# Patient Record
Sex: Female | Born: 1990
Health system: Southern US, Community
[De-identification: ages and names within clinical notes are randomized; demographics above are authoritative.]

## PROBLEM LIST (undated history)

## (undated) DIAGNOSIS — L509 Urticaria, unspecified: Secondary | ICD-10-CM

## (undated) DIAGNOSIS — E669 Obesity, unspecified: Secondary | ICD-10-CM

## (undated) DIAGNOSIS — F419 Anxiety disorder, unspecified: Secondary | ICD-10-CM

## (undated) DIAGNOSIS — T783XXA Angioneurotic edema, initial encounter: Secondary | ICD-10-CM

## (undated) DIAGNOSIS — Z9109 Other allergy status, other than to drugs and biological substances: Secondary | ICD-10-CM

## (undated) HISTORY — PX: WISDOM TOOTH EXTRACTION: SHX21

## (undated) HISTORY — DX: Urticaria, unspecified: L50.9

## (undated) HISTORY — DX: Anxiety disorder, unspecified: F41.9

## (undated) HISTORY — PX: TONSILLECTOMY: SUR1361

## (undated) HISTORY — DX: Angioneurotic edema, initial encounter: T78.3XXA

---
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
## 2008-05-09 ENCOUNTER — Emergency Department (HOSPITAL_COMMUNITY): Admission: EM | Admit: 2008-05-09 | Discharge: 2008-05-09 | Payer: Self-pay | Admitting: Family Medicine

## 2009-04-18 ENCOUNTER — Emergency Department (HOSPITAL_COMMUNITY): Admission: EM | Admit: 2009-04-18 | Discharge: 2009-04-18 | Payer: Self-pay | Admitting: Family Medicine

## 2010-02-19 ENCOUNTER — Emergency Department (HOSPITAL_COMMUNITY): Admission: EM | Admit: 2010-02-19 | Discharge: 2010-02-19 | Payer: Self-pay | Admitting: Family Medicine

## 2010-03-11 ENCOUNTER — Emergency Department (HOSPITAL_COMMUNITY)
Admission: EM | Admit: 2010-03-11 | Discharge: 2010-03-11 | Payer: Self-pay | Source: Home / Self Care | Admitting: Family Medicine

## 2010-08-11 NOTE — Op Note (Signed)
[place]  Patient:    Felicia Salinas, Felicia Salinas                      MRN: [id number] Proc. Date: 05/10/00 Adm. Date:  [id number] Disc. Date: [id number] Attending:  Lucky Cowboy CC:         Velvet Bathe, M.D.                           Operative Report  PREOPERATIVE DIAGNOSIS:  Obstructive sleep apnea.  POSTOPERATIVE DIAGNOSIS:  Obstructive sleep apnea.  PROCEDURE:  Adenotonsillectomy.  ANESTHESIA:  General endotracheal anesthesia.  ESTIMATED BLOOD LOSS:  30 cc.  SPECIMENS:  Tonsils and adenoids.  COMPLICATIONS:  None.  INDICATIONS:  This patient is an 20 year old female who has had very poor energy during the day.  There is mouth-breathing and apnea at night.  She was found to have 3+ to kissing bilateral palatine tonsils.  For this reason, adenotonsillectomy is performed.  FINDINGS:  The patient was noted to have obstruction of the nasopharynx from adenoid hypertrophy.  Both tonsils were somewhat cryptic and at least 3+ bilaterally.  DESCRIPTION OF PROCEDURE:  The patient was taken to the operating room and placed on the table in supine position.  She was then placed under general endotracheal anesthesia, the table rotated counterclockwise 90 degrees.  The neck was then gently extended using a shoulder roll.  The eyes were taped shut.  The head and body were draped.  The Crowe-Davis mouth gag with the #3 tongue blade was then placed intraorally, opened, and suspended on the Mayo stand.  Palpation of the soft palate was without evidence of a submucosal cleft.  A red rubber catheter was placed down the right nostril, brought out through the oral cavity, and secured in place with a hemostat.  Inspection of the nasopharynx was performed using a mirror.  Under indirect visualization, a medium-sized adenoid curette was placed against the vomer and directed inferiorly.  Subsequent passes were made.  This removed the majority of the adenoid pad.  The  remainder was removed using tonsil and [place] forceps. Three sterile gauze Afrin-soaked packs were placed in the nasopharynx and time allowed for hemostasis.  The palate was relaxed.  The tonsils were then removed.  The right palatine tonsil was grasped with Allis camps, directed inferomedially.  Harmonic scalpel was used to excise the tonsil in the peritonsillar space, staying adjacent to the left tonsillar capsule.  The left palatine tonsil was removed in identical fashion.  The palate was then re-elevated and all packs removed from the nasopharynx.  Point hemostasis was achieved using suction cautery.  The nasopharynx was copiously irrigated using normal saline transnasally, which was suctioned out through the oral cavity. An NG tube was placed down the esophagus for suctioning of the gastric contents.  The mouth gag was removed, noting no damage to the teeth or soft tissues.  The table was rotated clockwise 90 degrees to its original position. The patient was then awakened from anesthesia and extubated in the operating room.  She was taken to the postanesthesia care unit in stable condition. There were no complications.  The patient will be discharged in the recovery care center and will either be discharged later tonight or in the morning depending on progress.  DISCHARGE MEDICATIONS:  Lortab elixir 10-20 cc p.o. q.4-6h. p.r.n. pain; amoxicillin 250 mg/5 cc, 10 cc p.o. t.i.d. x 7 days; Phenergan 6.25  mg/5 cc, 20 cc p.o. q.6h. p.r.n. nausea.  RETURN APPOINTMENT:  With Dr. Gerilyn Pilgrim June 04, 2000, at 1 p.m. DD:  05/10/00 TD:  05/10/00 Job: [id number]

## 2010-12-22 LAB — POCT URINALYSIS DIP (DEVICE)
Hgb urine dipstick: NEGATIVE
Nitrite: NEGATIVE
Specific Gravity, Urine: 1.02
Urobilinogen, UA: 1
pH: 7

## 2010-12-22 LAB — WET PREP, GENITAL: Trich, Wet Prep: NONE SEEN

## 2010-12-22 LAB — GC/CHLAMYDIA PROBE AMP, GENITAL
Chlamydia, DNA Probe: NEGATIVE
GC Probe Amp, Genital: NEGATIVE

## 2011-01-03 LAB — URINALYSIS, ROUTINE W REFLEX MICROSCOPIC
Bilirubin Urine: NEGATIVE
Nitrite: NEGATIVE
Specific Gravity, Urine: 1.033 — ABNORMAL HIGH
Urobilinogen, UA: 1
pH: 6.5

## 2011-01-03 LAB — POCT PREGNANCY, URINE: Operator id: 244461

## 2011-04-07 ENCOUNTER — Encounter (HOSPITAL_COMMUNITY): Payer: Self-pay | Admitting: Certified Registered Nurse Anesthetist

## 2011-04-07 ENCOUNTER — Emergency Department (HOSPITAL_COMMUNITY): Payer: BC Managed Care – PPO | Admitting: Certified Registered Nurse Anesthetist

## 2011-04-07 ENCOUNTER — Emergency Department (INDEPENDENT_AMBULATORY_CARE_PROVIDER_SITE_OTHER)
Admission: EM | Admit: 2011-04-07 | Discharge: 2011-04-07 | Disposition: A | Discharge status: Nursing Home (Includes ICF) | Payer: BC Managed Care – PPO | Source: Home / Self Care | Attending: Emergency Medicine | Admitting: Emergency Medicine

## 2011-04-07 ENCOUNTER — Inpatient Hospital Stay (HOSPITAL_COMMUNITY)
Admission: EM | Admit: 2011-04-07 | Discharge: 2011-04-16 | DRG: 164 | Disposition: A | Discharge status: Home / Self Care | Payer: BC Managed Care – PPO | Source: Ambulatory Visit | Attending: General Surgery | Admitting: General Surgery

## 2011-04-07 ENCOUNTER — Emergency Department (HOSPITAL_COMMUNITY): Payer: BC Managed Care – PPO

## 2011-04-07 ENCOUNTER — Encounter (HOSPITAL_COMMUNITY): Admission: EM | Disposition: A | Discharge status: Home / Self Care | Payer: Self-pay | Source: Ambulatory Visit

## 2011-04-07 ENCOUNTER — Encounter (HOSPITAL_COMMUNITY): Payer: Self-pay | Admitting: *Deleted

## 2011-04-07 ENCOUNTER — Other Ambulatory Visit (INDEPENDENT_AMBULATORY_CARE_PROVIDER_SITE_OTHER): Payer: Self-pay | Admitting: General Surgery

## 2011-04-07 DIAGNOSIS — K35209 Acute appendicitis with generalized peritonitis, without abscess, unspecified as to perforation: Principal | ICD-10-CM | POA: Diagnosis present

## 2011-04-07 DIAGNOSIS — E669 Obesity, unspecified: Secondary | ICD-10-CM

## 2011-04-07 DIAGNOSIS — R102 Pelvic and perineal pain: Secondary | ICD-10-CM

## 2011-04-07 DIAGNOSIS — K352 Acute appendicitis with generalized peritonitis, without abscess: Principal | ICD-10-CM | POA: Diagnosis present

## 2011-04-07 DIAGNOSIS — E876 Hypokalemia: Secondary | ICD-10-CM | POA: Diagnosis not present

## 2011-04-07 DIAGNOSIS — Z6839 Body mass index (BMI) 39.0-39.9, adult: Secondary | ICD-10-CM

## 2011-04-07 DIAGNOSIS — N949 Unspecified condition associated with female genital organs and menstrual cycle: Secondary | ICD-10-CM

## 2011-04-07 DIAGNOSIS — K353 Acute appendicitis with localized peritonitis, without perforation or gangrene: Secondary | ICD-10-CM | POA: Diagnosis present

## 2011-04-07 DIAGNOSIS — K560 Paralytic ileus: Secondary | ICD-10-CM | POA: Diagnosis not present

## 2011-04-07 DIAGNOSIS — Z5331 Laparoscopic surgical procedure converted to open procedure: Secondary | ICD-10-CM

## 2011-04-07 HISTORY — DX: Other allergy status, other than to drugs and biological substances: Z91.09

## 2011-04-07 HISTORY — PX: APPENDECTOMY: SHX54

## 2011-04-07 LAB — CBC
HCT: 39 % (ref 36.0–46.0)
MCHC: 34.6 g/dL (ref 30.0–36.0)
MCV: 90.7 fL (ref 78.0–100.0)
Platelets: 377 10*3/uL (ref 150–400)
RDW: 12.4 % (ref 11.5–15.5)
WBC: 8.4 10*3/uL (ref 4.0–10.5)

## 2011-04-07 LAB — POCT URINALYSIS DIP (DEVICE)
Ketones, ur: NEGATIVE mg/dL
Protein, ur: NEGATIVE mg/dL
Specific Gravity, Urine: 1.015 (ref 1.005–1.030)
Urobilinogen, UA: 1 mg/dL (ref 0.0–1.0)
pH: 7 (ref 5.0–8.0)

## 2011-04-07 LAB — DIFFERENTIAL
Basophils Absolute: 0 10*3/uL (ref 0.0–0.1)
Basophils Relative: 0 % (ref 0–1)
Eosinophils Absolute: 0.1 10*3/uL (ref 0.0–0.7)
Eosinophils Relative: 2 % (ref 0–5)
Monocytes Absolute: 0.7 10*3/uL (ref 0.1–1.0)

## 2011-04-07 LAB — COMPREHENSIVE METABOLIC PANEL
ALT: 15 U/L (ref 0–35)
AST: 16 U/L (ref 0–37)
Albumin: 3.8 g/dL (ref 3.5–5.2)
Calcium: 9.8 mg/dL (ref 8.4–10.5)
Creatinine, Ser: 0.64 mg/dL (ref 0.50–1.10)
GFR calc non Af Amer: >90 mL/min (ref >90)
Sodium: 137 mEq/L (ref 135–145)
Total Protein: 8.1 g/dL (ref 6.0–8.3)

## 2011-04-07 LAB — WET PREP, GENITAL
Trich, Wet Prep: NONE SEEN
Yeast Wet Prep HPF POC: NONE SEEN

## 2011-04-07 LAB — POCT PREGNANCY, URINE: Preg Test, Ur: NEGATIVE

## 2011-04-07 SURGERY — APPENDECTOMY, LAPAROSCOPIC
Anesthesia: General | Site: Abdomen | Wound class: Dirty or Infected

## 2011-04-07 MED ORDER — SODIUM CHLORIDE 0.9 % IV SOLN
INTRAVENOUS | Status: DC
  Administered 2011-04-08 – 2011-04-09 (×3): via INTRAVENOUS
  Administered 2011-04-09: 1000 mL via INTRAVENOUS
  Administered 2011-04-09: 22:00:00 via INTRAVENOUS
  Administered 2011-04-10: 1000 mL via INTRAVENOUS
  Administered 2011-04-10 – 2011-04-11 (×2): via INTRAVENOUS

## 2011-04-07 MED ORDER — FENTANYL CITRATE 0.05 MG/ML IJ SOLN
INTRAMUSCULAR | Status: DC | PRN
  Administered 2011-04-07 (×2): 50 ug via INTRAVENOUS
  Administered 2011-04-07 (×2): 100 ug via INTRAVENOUS
  Administered 2011-04-07 (×3): 50 ug via INTRAVENOUS

## 2011-04-07 MED ORDER — SUCCINYLCHOLINE CHLORIDE 20 MG/ML IJ SOLN
INTRAMUSCULAR | Status: DC | PRN
  Administered 2011-04-07: 180 mg via INTRAVENOUS

## 2011-04-07 MED ORDER — SODIUM CHLORIDE 0.9 % IR SOLN
Status: DC | PRN
  Administered 2011-04-07: 1000 mL

## 2011-04-07 MED ORDER — SODIUM CHLORIDE 0.9 % IV BOLUS (SEPSIS)
1000.0000 mL | Freq: Once | INTRAVENOUS | Status: AC
  Administered 2011-04-07: 1000 mL via INTRAVENOUS

## 2011-04-07 MED ORDER — MIDAZOLAM HCL 5 MG/5ML IJ SOLN
INTRAMUSCULAR | Status: DC | PRN
  Administered 2011-04-07: 2 mg via INTRAVENOUS

## 2011-04-07 MED ORDER — KETOROLAC TROMETHAMINE 30 MG/ML IJ SOLN
30.0000 mg | Freq: Once | INTRAMUSCULAR | Status: AC
  Administered 2011-04-07: 30 mg via INTRAVENOUS

## 2011-04-07 MED ORDER — SODIUM CHLORIDE 0.9 % IV SOLN
1.0000 g | Freq: Once | INTRAVENOUS | Status: AC
  Administered 2011-04-07: 1 g via INTRAVENOUS
  Filled 2011-04-07: qty 1

## 2011-04-07 MED ORDER — BUPIVACAINE-EPINEPHRINE 0.25% -1:200000 IJ SOLN
INTRAMUSCULAR | Status: DC | PRN
  Administered 2011-04-07: 14 mL

## 2011-04-07 MED ORDER — DROPERIDOL 2.5 MG/ML IJ SOLN
INTRAMUSCULAR | Status: DC | PRN
  Administered 2011-04-07: 0.625 mg via INTRAVENOUS

## 2011-04-07 MED ORDER — ONDANSETRON HCL 4 MG/2ML IJ SOLN
INTRAMUSCULAR | Status: DC | PRN
  Administered 2011-04-07: 4 mg via INTRAVENOUS

## 2011-04-07 MED ORDER — PROPOFOL 10 MG/ML IV EMUL
INTRAVENOUS | Status: DC | PRN
  Administered 2011-04-07: 300 mg via INTRAVENOUS

## 2011-04-07 MED ORDER — LACTATED RINGERS IV SOLN
INTRAVENOUS | Status: DC | PRN
  Administered 2011-04-07 (×3): via INTRAVENOUS

## 2011-04-07 MED ORDER — IOHEXOL 300 MG/ML  SOLN
20.0000 mL | Freq: Once | INTRAMUSCULAR | Status: AC | PRN
  Administered 2011-04-07: 20 mL via ORAL

## 2011-04-07 MED ORDER — IOHEXOL 300 MG/ML  SOLN
100.0000 mL | Freq: Once | INTRAMUSCULAR | Status: AC | PRN
  Administered 2011-04-07: 100 mL via INTRAVENOUS

## 2011-04-07 MED ORDER — ROCURONIUM BROMIDE 100 MG/10ML IV SOLN
INTRAVENOUS | Status: DC | PRN
  Administered 2011-04-07 (×4): 10 mg via INTRAVENOUS
  Administered 2011-04-07: 25 mg via INTRAVENOUS

## 2011-04-07 SURGICAL SUPPLY — 65 items
ADH SKN CLS APL DERMABOND .7 (GAUZE/BANDAGES/DRESSINGS) ×1
APL SKNCLS STERI-STRIP NONHPOA (GAUZE/BANDAGES/DRESSINGS) ×1
APPLIER CLIP ROT 10 11.4 M/L (STAPLE)
APR CLP MED LRG 11.4X10 (STAPLE)
BAG SPEC RTRVL LRG 6X4 10 (ENDOMECHANICALS) ×1
BENZOIN TINCTURE PRP APPL 2/3 (GAUZE/BANDAGES/DRESSINGS) ×2 IMPLANT
CANISTER SUCTION 2500CC (MISCELLANEOUS) ×1 IMPLANT
CHLORAPREP W/TINT 26ML (MISCELLANEOUS) ×2 IMPLANT
CLIP APPLIE ROT 10 11.4 M/L (STAPLE) IMPLANT
CLOTH BEACON ORANGE TIMEOUT ST (SAFETY) ×2 IMPLANT
COVER SURGICAL LIGHT HANDLE (MISCELLANEOUS) ×2 IMPLANT
CUTTER FLEX LINEAR 45M (STAPLE) ×1 IMPLANT
DERMABOND ADVANCED (GAUZE/BANDAGES/DRESSINGS) ×1
DERMABOND ADVANCED .7 DNX12 (GAUZE/BANDAGES/DRESSINGS) ×1 IMPLANT
DRAIN CHANNEL 19F RND (DRAIN) ×1 IMPLANT
DRSG VAC ATS MED SENSATRAC (GAUZE/BANDAGES/DRESSINGS) ×2 IMPLANT
ELECT BLADE 4.0 EZ CLEAN MEGAD (MISCELLANEOUS) ×2
ELECT REM PT RETURN 9FT ADLT (ELECTROSURGICAL) ×2
ELECTRODE BLDE 4.0 EZ CLN MEGD (MISCELLANEOUS) IMPLANT
ELECTRODE REM PT RTRN 9FT ADLT (ELECTROSURGICAL) ×1 IMPLANT
EVACUATOR SILICONE 100CC (DRAIN) ×1 IMPLANT
GLOVE BIO SURGEON STRL SZ7 (GLOVE) ×2 IMPLANT
GLOVE BIOGEL PI IND STRL 7.5 (GLOVE) ×1 IMPLANT
GLOVE BIOGEL PI IND STRL 8 (GLOVE) ×1 IMPLANT
GLOVE BIOGEL PI INDICATOR 7.5 (GLOVE) ×1
GLOVE BIOGEL PI INDICATOR 8 (GLOVE) ×1
GLOVE ECLIPSE 8.0 STRL XLNG CF (GLOVE) ×2 IMPLANT
GOWN STRL NON-REIN LRG LVL3 (GOWN DISPOSABLE) ×6 IMPLANT
KIT BASIN OR (CUSTOM PROCEDURE TRAY) ×2 IMPLANT
KIT ROOM TURNOVER OR (KITS) ×2 IMPLANT
NS IRRIG 1000ML POUR BTL (IV SOLUTION) ×2 IMPLANT
PAD ARMBOARD 7.5X6 YLW CONV (MISCELLANEOUS) ×4 IMPLANT
PENCIL BUTTON HOLSTER BLD 10FT (ELECTRODE) ×2 IMPLANT
POUCH SPECIMEN RETRIEVAL 10MM (ENDOMECHANICALS) ×2 IMPLANT
RELOAD 45 VASCULAR/THIN (ENDOMECHANICALS) IMPLANT
RELOAD STAPLE 45 2.5 WHT GRN (ENDOMECHANICALS) ×1 IMPLANT
RELOAD STAPLE TA45 3.5 REG BLU (ENDOMECHANICALS) IMPLANT
SCALPEL HARMONIC ACE (MISCELLANEOUS) ×2 IMPLANT
SCISSORS LAP 5X35 DISP (ENDOMECHANICALS) IMPLANT
SET IRRIG TUBING LAPAROSCOPIC (IRRIGATION / IRRIGATOR) ×2 IMPLANT
SLEEVE ENDOPATH XCEL 5M (ENDOMECHANICALS) ×2 IMPLANT
SPECIMEN JAR SMALL (MISCELLANEOUS) ×2 IMPLANT
SPONGE GAUZE 4X4 12PLY (GAUZE/BANDAGES/DRESSINGS) ×1 IMPLANT
SPONGE LAP 18X18 X RAY DECT (DISPOSABLE) ×1 IMPLANT
STAPLER VISISTAT 35W (STAPLE) ×1 IMPLANT
SUT ETHILON 2 0 FS 18 (SUTURE) ×1 IMPLANT
SUT MNCRL AB 4-0 PS2 18 (SUTURE) ×2 IMPLANT
SUT PDS AB 1 TP1 96 (SUTURE) ×2 IMPLANT
SUT VIC AB 0 CT1 27 (SUTURE) ×4
SUT VIC AB 0 CT1 27XBRD ANBCTR (SUTURE) ×2 IMPLANT
SUT VIC AB 2-0 SH 27 (SUTURE) ×6
SUT VIC AB 2-0 SH 27X BRD (SUTURE) ×1 IMPLANT
SUT VIC AB 2-0 SH 27XBRD (SUTURE) ×2 IMPLANT
SUT VIC AB 3-0 SH 27 (SUTURE) ×2
SUT VIC AB 3-0 SH 27XBRD (SUTURE) IMPLANT
SYR BULB IRRIGATION 50ML (SYRINGE) ×2 IMPLANT
TAPE CLOTH SURG 4X10 WHT LF (GAUZE/BANDAGES/DRESSINGS) ×2 IMPLANT
TOWEL OR 17X24 6PK STRL BLUE (TOWEL DISPOSABLE) ×2 IMPLANT
TOWEL OR 17X26 10 PK STRL BLUE (TOWEL DISPOSABLE) ×2 IMPLANT
TRAY FOLEY CATH 14FR (SET/KITS/TRAYS/PACK) ×2 IMPLANT
TRAY LAPAROSCOPIC (CUSTOM PROCEDURE TRAY) ×2 IMPLANT
TROCAR XCEL BLUNT TIP 100MML (ENDOMECHANICALS) ×2 IMPLANT
TROCAR XCEL NON-BLD 5MMX100MML (ENDOMECHANICALS) ×2 IMPLANT
TUBE CONNECTING 12X1/4 (SUCTIONS) ×1 IMPLANT
YANKAUER SUCT BULB TIP NO VENT (SUCTIONS) ×1 IMPLANT

## 2011-04-07 NOTE — ED Notes (Signed)
Report given to Nolon Lennert., RN and pt moved to room 30 to await admission for surgery.

## 2011-04-07 NOTE — ED Notes (Signed)
CT Tech brought pt oral contrast.

## 2011-04-07 NOTE — Preoperative (Signed)
Beta Blockers   Reason not to administer Beta Blockers:Not Applicable 

## 2011-04-07 NOTE — Anesthesia Procedure Notes (Signed)
Procedure Name: Intubation Date/Time: 04/07/2011 9:43 PM Performed by: Pricilla Holm Pre-anesthesia Checklist: Patient identified, Emergency Drugs available, Suction available, Patient being monitored and Timeout performed Patient Re-evaluated:Patient Re-evaluated prior to inductionOxygen Delivery Method: Circle System Utilized Preoxygenation: Pre-oxygenation with 100% oxygen Intubation Type: Rapid sequence and IV induction Laryngoscope Size: Mac and 3 Grade View: Grade I Tube type: Oral Tube size: 7.5 mm Number of attempts: 1 Airway Equipment and Method: stylet Placement Confirmation: ETT inserted through vocal cords under direct vision,  breath sounds checked- equal and bilateral and positive ETCO2 Secured at: 22 cm Tube secured with: Tape Dental Injury: Teeth and Oropharynx as per pre-operative assessment

## 2011-04-07 NOTE — ED Provider Notes (Addendum)
History     CSN: [id number]  Arrival date & time 04/07/11  1300   First MD Initiated Contact with Patient 04/07/11 1432      Chief Complaint  Patient presents with  . Abdominal Pain   Patient presents with worsening right lower quadrant pain since Tuesday, 6 days ago. States the pain is 6/10, but got worse last night. She denies any nausea, vomiting, fatigue, dysuria, vaginal discharge or vaginal bleeding. Patient was sent from the urgent care and had a normal urinalysis, and urine pregnant test, already. Negative.  (Consider location/radiation/quality/duration/timing/severity/associated sxs/prior treatment) HPI  Past Medical History  Diagnosis Date  . Asthma   . Environmental allergies     Past Surgical History  Procedure Date  . Tonsillectomy     No family history on file.  History  Substance Use Topics  . Smoking status: Never Smoker   . Smokeless tobacco: Not on file  . Alcohol Use: Yes     Occasional    OB History    Grav Para Term Preterm Abortions TAB SAB Ect Mult Living                  Review of Systems  All other systems reviewed and are negative.    Allergies  Other  Home Medications   Current Outpatient Rx  Name Route Sig Dispense Refill  . ALBUTEROL SULFATE HFA 108 (90 BASE) MCG/ACT IN AERS Inhalation Inhale 2 puffs into the lungs every 4 (four) hours as needed. For shortness of breath    . BECLOMETHASONE DIPROPIONATE 40 MCG/ACT IN AERS Inhalation Inhale 2 puffs into the lungs 2 (two) times daily.    Marland Kitchen FEXOFENADINE HCL PO Oral Take 1 tablet by mouth every morning.     Marland Kitchen LORATADINE PO Oral Take 1 tablet by mouth at bedtime.       BP 106/63  Pulse 82  Temp(Src) 97.8 F (36.6 C) (Oral)  Resp 20  SpO2 100%  LMP 03/28/2011  Physical Exam  Nursing note and vitals reviewed. Constitutional: She appears well-developed and well-nourished. No distress.  HENT:  Head: Normocephalic.  Eyes: Pupils are equal, round, and reactive to light.    Cardiovascular: Normal heart sounds.   Pulmonary/Chest: Breath sounds normal.  Abdominal: Soft. There is tenderness. There is no rebound and no guarding.  Musculoskeletal: Normal range of motion.  Neurological: She is alert.  Skin: Skin is warm and dry.    ED Course  Procedures (including critical care time)   Labs Reviewed  CBC  DIFFERENTIAL  COMPREHENSIVE METABOLIC PANEL  LIPASE, BLOOD   No results found.   No diagnosis found.    MDM  Pt is seen and examined;  Initial history and physical completed.  Will follow.     Will need labs, CT, pelvic exam.  IVF.      Kempton Milne A. Patrica Duel, MD 04/07/11 1440  Edlyn Rosenburg A. Patrica Duel, MD 05/04/11 [phone]

## 2011-04-07 NOTE — ED Provider Notes (Signed)
History     CSN: [id number]  Arrival date & time 04/07/11  1118   First MD Initiated Contact with Patient 04/07/11 1120      Chief Complaint  Patient presents with  . Abdominal Pain    (Consider location/radiation/quality/duration/timing/severity/associated sxs/prior treatment) HPI Comments: Since Tuesday been having "pains on my stomach" (points to lower abdominal region), No further symptoms. Pain has gotten much worse today, can even lay flat on my bed, have to be in sitting position"  Patient is a 21 y.o. female presenting with abdominal pain. The history is provided by the patient.  Abdominal Pain The primary symptoms of the illness include abdominal pain and shortness of breath. The primary symptoms of the illness do not include fatigue, nausea, hematochezia, dysuria, vaginal discharge or vaginal bleeding. The onset of the illness was gradual. The problem has been gradually worsening.  The patient states that she believes she is currently not pregnant. The patient has not had a change in bowel habit. Symptoms associated with the illness do not include chills, anorexia, constipation, urgency, hematuria, frequency or back pain. Significant associated medical issues do not include PUD, GERD, diverticulitis or HIV.    Past Medical History  Diagnosis Date  . Asthma   . Environmental allergies     Past Surgical History  Procedure Date  . Tonsillectomy     History reviewed. No pertinent family history.  History  Substance Use Topics  . Smoking status: Never Smoker   . Smokeless tobacco: Not on file  . Alcohol Use: Yes     Occasional    OB History    Grav Para Term Preterm Abortions TAB SAB Ect Mult Living                  Review of Systems  Constitutional: Negative for chills and fatigue.  Respiratory: Positive for shortness of breath.   Gastrointestinal: Positive for abdominal pain. Negative for nausea, constipation, hematochezia and anorexia.  Genitourinary:  Negative for dysuria, urgency, frequency, hematuria, vaginal bleeding and vaginal discharge.  Musculoskeletal: Negative for back pain.  Skin: Negative for rash.    Allergies  Other  Home Medications   Current Outpatient Rx  Name Route Sig Dispense Refill  . ALBUTEROL IN Inhalation Inhale into the lungs as needed.    Marland Kitchen PULMICORT IN Inhalation Inhale into the lungs 2 (two) times daily.    Marland Kitchen FEXOFENADINE HCL PO Oral Take by mouth.    Marland Kitchen LORATADINE PO Oral Take by mouth.      LMP 03/28/2011  Physical Exam  Nursing note and vitals reviewed. Constitutional: She appears well-nourished. No distress.  HENT:  Head: Atraumatic.  Eyes: Conjunctivae are normal. No scleral icterus.  Neck: Neck supple.  Cardiovascular: Normal rate.   Pulmonary/Chest: Effort normal. No respiratory distress.  Abdominal: Bowel sounds are normal. She exhibits no distension and no mass. There is tenderness. There is no rigidity, no rebound and no guarding.  Genitourinary:     Musculoskeletal: She exhibits no edema.  Skin: No erythema.    ED Course  Procedures (including critical care time)   Labs Reviewed  POCT PREGNANCY, URINE  POCT URINALYSIS DIP (DEVICE)  POCT URINALYSIS DIPSTICK  POCT PREGNANCY, URINE   No results found.   1. Pelvic pain in female       MDM  Pelvic Pain x 5 days, focal exam. To RLQ and suprapubic area. No urinary symptoms with negative urine. Considering acute ovarian pathology torsion, cyst etc. Stable.  Jimmie Molly, MD 04/07/11 1234

## 2011-04-07 NOTE — H&P (Signed)
Felicia Salinas is an 21 y.o. female.   Chief Complaint: abdominal pain   Referred by Dr. Patrica Duel HPI:  49 yof with right sided abdominal pain since Monday/Tuesday.  This has progressed and is not being relieved at this point. Aggravated by movement.  No fevers, no chills, no n/v, having normal bowel movements.  No prior history of any ab pain.  No diarrhea/constipation.  She has some anorexia.  No dysuria or urinary symptoms.  No change in menstrual periods.    Past Medical History  Diagnosis Date  . Asthma   . Environmental allergies     Past Surgical History  Procedure Date  . Tonsillectomy     History reviewed. No pertinent family history. Social History:  reports that she has never smoked. She does not have any smokeless tobacco history on file. She reports that she drinks alcohol. Her drug history not on file.  Allergies:  Allergies  Allergen Reactions  . Other Hives    Chicken, shrimp, dairy products, shellfish    Meds: allergy medication  Results for orders placed during the hospital encounter of 04/07/11 (from the past 48 hour(s))  CBC     Status: Normal   Collection Time   04/07/11  2:36 PM      Component Value Range Comment   WBC 8.4  4.0 - 10.5 (K/uL)    RBC 4.30  3.87 - 5.11 (MIL/uL)    Hemoglobin 13.5  12.0 - 15.0 (g/dL)    HCT 16.1  09.6 - 04.5 (%)    MCV 90.7  78.0 - 100.0 (fL)    MCH 31.4  26.0 - 34.0 (pg)    MCHC 34.6  30.0 - 36.0 (g/dL)    RDW 40.9  81.1 - 91.4 (%)    Platelets 377  150 - 400 (K/uL)   DIFFERENTIAL     Status: Normal   Collection Time   04/07/11  2:36 PM      Component Value Range Comment   Neutrophils Relative 60  43 - 77 (%)    Neutro Abs 5.0  1.7 - 7.7 (K/uL)    Lymphocytes Relative 30  12 - 46 (%)    Lymphs Abs 2.5  0.7 - 4.0 (K/uL)    Monocytes Relative 9  3 - 12 (%)    Monocytes Absolute 0.7  0.1 - 1.0 (K/uL)    Eosinophils Relative 2  0 - 5 (%)    Eosinophils Absolute 0.1  0.0 - 0.7 (K/uL)    Basophils Relative 0  0 - 1 (%)     Basophils Absolute 0.0  0.0 - 0.1 (K/uL)   COMPREHENSIVE METABOLIC PANEL     Status: Normal   Collection Time   04/07/11  2:36 PM      Component Value Range Comment   Sodium 137  135 - 145 (mEq/L)    Potassium 3.6  3.5 - 5.1 (mEq/L)    Chloride 100  96 - 112 (mEq/L)    CO2 27  19 - 32 (mEq/L)    Glucose, Bld 85  70 - 99 (mg/dL)    BUN 6  6 - 23 (mg/dL)    Creatinine, Ser 7.82  0.50 - 1.10 (mg/dL)    Calcium 9.8  8.4 - 10.5 (mg/dL)    Total Protein 8.1  6.0 - 8.3 (g/dL)    Albumin 3.8  3.5 - 5.2 (g/dL)    AST 16  0 - 37 (U/L)    ALT 15  0 -  35 (U/L)    Alkaline Phosphatase 55  39 - 117 (U/L)    Total Bilirubin 0.3  0.3 - 1.2 (mg/dL)    GFR calc non Af Amer >90  >90 (mL/min)    GFR calc Af Amer >90  >90 (mL/min)   LIPASE, BLOOD     Status: Normal   Collection Time   04/07/11  2:36 PM      Component Value Range Comment   Lipase 33  11 - 59 (U/L)   WET PREP, GENITAL     Status: Abnormal   Collection Time   04/07/11  4:04 PM      Component Value Range Comment   Yeast, Wet Prep NONE SEEN  NONE SEEN     Trich, Wet Prep NONE SEEN  NONE SEEN     Clue Cells, Wet Prep MANY (*) NONE SEEN     WBC, Wet Prep HPF POC RARE (*) NONE SEEN     Ct Abdomen Pelvis W Contrast  04/07/2011  *RADIOLOGY REPORT*  Clinical Data: Right lower quadrant pain.  Pain radiates into the right side.  Symptoms for 6 days.  CT ABDOMEN AND PELVIS WITH CONTRAST  Technique:  Multidetector CT imaging of the abdomen and pelvis was performed following the standard protocol during bolus administration of intravenous contrast.  Contrast: OMNIPAQUE IOHEXOL 300 MG/ML IV SOLN, 20mL OMNIPAQUE IOHEXOL 300 MG/ML IV SOLN  Comparison: None  Findings: Within the right lower quadrant, there is significant inflammatory change.  Medial to the cecum, what appears to be the appendix appears inflamed and dilated, measuring 1.8 cm.  There is significant stranding surrounding this structure.  Significant right lower quadrant adenopathy is  identified, favored to be reactive.  The terminal ileum appears to be adjacent but less affected.  There is fluid within the right lower quadrant.  No evidence for free intraperitoneal air.  Lung bases are negative.  No focal abnormality is identified within the liver, spleen, pancreas, adrenal glands, or kidneys.  The gallbladder contains numerous calcified gallstones. No biliary ductal dilatation or pericholecystic fluid identified.  The uterus is present.  No adnexal mass. Visualized osseous structures have a normal appearance. No evidence for aortic aneurysm.  IMPRESSION:  1.  Significant inflammatory change within the right lower quadrant, associated with dilatation of probable appendix and adenopathy. The differential diagnosis includes acute appendicitis and Crohn disease.  The appearance favors acute appendicitis. 2. Multiple calcified gallstones within the gallbladder.  No other evidence for acute cholecystitis.  The findings were discussed with Dr. Patrica Duel on 04/07/2011 at 5:31 p.m.  Original Report Authenticated By: Patterson Hammersmith, M.D.    Review of Systems  Constitutional: Negative for fever, chills and weight loss.  Gastrointestinal: Positive for abdominal pain (right sided). Negative for nausea, vomiting, diarrhea, constipation and blood in stool.  Genitourinary: Negative for dysuria.    Blood pressure 126/89, pulse 74, temperature 97.9 F (36.6 C), temperature source Oral, resp. rate 19, last menstrual period 03/28/2011, SpO2 100.00%. Physical Exam  Constitutional: She appears well-developed and well-nourished.  Eyes: No scleral icterus.  Neck: Neck supple.  Cardiovascular: Normal rate, regular rhythm and normal heart sounds.   Respiratory: Breath sounds normal. She has no wheezes. She has no rales.  GI: Soft. Bowel sounds are normal. There is tenderness (rlq tenderness to palpation).       Obese   Lymphadenopathy:    She has no cervical adenopathy.  Neurological: She is  alert.     Assessment/Plan Acute appendicitis  She appears to have appendicitis on the scan.  Don't see any evidence that this is Crohns disease per the ct report.  It may very well be perforated due to time course and amount of inflammation.  There is no abscess.  I recommended proceeding to operating room for laparoscopic appendectomy.  Risks include, but are not limited to, bleeding, infection, postop abscess requiring drainage, open procedure, open wound, injury to other structures.  She understands and will proceed to OR as soon as possible.  Felicia Salinas 04/07/2011, 8:20 PM

## 2011-04-07 NOTE — ED Notes (Signed)
C/O gradual onset generalized abdominal aching (worse in low abd) since Tuesday with varying intensity.  Denies n/v/d; denies fevers.  Denies vaginal discharge.  Denies urinary sxs.  Has tried OTC pain relievers with some relief.  Last BM today - was slightly loose.

## 2011-04-07 NOTE — ED Notes (Signed)
Pt. Stated, I'm having stomach pain no other symptoms

## 2011-04-07 NOTE — ED Provider Notes (Signed)
3:47 PM Patient is in CDU holding for CT abdomen pelvis, pelvic exam.  This is a shared visit with Dr Patrica Duel. Patient has right lower quadrant pain. Reports pain is well controlled at this time, denies nausea.  No needs at this time.    4:03 PM  Abdominal exam:  Abdomen is obese, soft, nondistended, TTP RLQ, no guarding, no rebound. Pelvic exam:  External genitalia is normal, white malodorous vaginal discharge, cervix is nontender, bimanual exam is limited by patient body habitus, no midline or adnexal tenderness or mass.    Patient admitted by Dr Dwain Sarna for acute appendicitis.    Dillard Cannon [place], [place] 04/07/11 2234

## 2011-04-07 NOTE — Anesthesia Preprocedure Evaluation (Addendum)
Anesthesia Evaluation  Patient identified by MRN, date of birth, ID band Patient awake    Reviewed: Allergy & Precautions, H&P , NPO status , Patient's Chart, lab work & pertinent test results, reviewed documented beta blocker date and time   Airway Mallampati: II TM Distance: >3 FB Neck ROM: Full    Dental  (+) Teeth Intact   Pulmonary asthma ,          Cardiovascular neg cardio ROS     Neuro/Psych Negative Neurological ROS  Negative Psych ROS   GI/Hepatic negative GI ROS, Neg liver ROS,   Endo/Other  Morbid obesity  Renal/GU negative Renal ROS  Genitourinary negative   Musculoskeletal   Abdominal   Peds  Hematology negative hematology ROS (+)   Anesthesia Other Findings See surgeon's H&P   Reproductive/Obstetrics negative OB ROS                         Anesthesia Physical Anesthesia Plan  ASA: III and Emergent  Anesthesia Plan: General   Post-op Pain Management:    Induction: Intravenous, Rapid sequence and Cricoid pressure planned  Airway Management Planned: Oral ETT  Additional Equipment:   Intra-op Plan:   Post-operative Plan: Extubation in OR  Informed Consent: I have reviewed the patients History and Physical, chart, labs and discussed the procedure including the risks, benefits and alternatives for the proposed anesthesia with the patient or authorized representative who has indicated his/her understanding and acceptance.   Dental advisory given  Plan Discussed with: CRNA, Surgeon and Anesthesiologist  Anesthesia Plan Comments:       Anesthesia Quick Evaluation

## 2011-04-07 NOTE — ED Notes (Signed)
OR ready for patient nurse will call with any questions not able to come to the phone for report

## 2011-04-08 ENCOUNTER — Encounter (HOSPITAL_COMMUNITY): Payer: Self-pay | Admitting: *Deleted

## 2011-04-08 DIAGNOSIS — K353 Acute appendicitis with localized peritonitis, without perforation or gangrene: Secondary | ICD-10-CM | POA: Diagnosis present

## 2011-04-08 LAB — BASIC METABOLIC PANEL
BUN: 5 mg/dL — ABNORMAL LOW (ref 6–23)
Chloride: 103 mEq/L (ref 96–112)
Creatinine, Ser: 0.65 mg/dL (ref 0.50–1.10)
GFR calc Af Amer: >90 mL/min (ref >90)
GFR calc non Af Amer: >90 mL/min (ref >90)
Potassium: 4 mEq/L (ref 3.5–5.1)

## 2011-04-08 LAB — CBC
HCT: 34.5 % — ABNORMAL LOW (ref 36.0–46.0)
MCHC: 33.6 g/dL (ref 30.0–36.0)
RDW: 12.4 % (ref 11.5–15.5)
WBC: 12.2 10*3/uL — ABNORMAL HIGH (ref 4.0–10.5)

## 2011-04-08 MED ORDER — NALOXONE HCL 0.4 MG/ML IJ SOLN: 0.4000 mg | INTRAMUSCULAR | Status: DC | PRN

## 2011-04-08 MED ORDER — ACETAMINOPHEN 650 MG RE SUPP: 650.0000 mg | Freq: Four times a day (QID) | RECTAL | Status: DC | PRN

## 2011-04-08 MED ORDER — HYDROMORPHONE HCL PF 1 MG/ML IJ SOLN
INTRAMUSCULAR | Status: AC
  Filled 2011-04-08: qty 1

## 2011-04-08 MED ORDER — DIPHENHYDRAMINE HCL 12.5 MG/5ML PO ELIX
12.5000 mg | ORAL_SOLUTION | Freq: Four times a day (QID) | ORAL | Status: DC | PRN
  Filled 2011-04-08: qty 5

## 2011-04-08 MED ORDER — METOCLOPRAMIDE HCL 5 MG/ML IJ SOLN: 10.0000 mg | Freq: Once | INTRAMUSCULAR | Status: DC | PRN

## 2011-04-08 MED ORDER — GLYCOPYRROLATE 0.2 MG/ML IJ SOLN
INTRAMUSCULAR | Status: DC | PRN
  Administered 2011-04-08: .8 mg via INTRAVENOUS
  Administered 2011-04-08: .2 mg via INTRAVENOUS

## 2011-04-08 MED ORDER — HEPARIN SODIUM (PORCINE) 5000 UNIT/ML IJ SOLN
5000.0000 [IU] | Freq: Three times a day (TID) | INTRAMUSCULAR | Status: DC
  Administered 2011-04-09 – 2011-04-16 (×22): 5000 [IU] via SUBCUTANEOUS
  Filled 2011-04-08 (×25): qty 1

## 2011-04-08 MED ORDER — ALBUTEROL SULFATE HFA 108 (90 BASE) MCG/ACT IN AERS: 2.0000 | INHALATION_SPRAY | RESPIRATORY_TRACT | Status: DC | PRN

## 2011-04-08 MED ORDER — LORATADINE 10 MG PO TABS
10.0000 mg | ORAL_TABLET | Freq: Every day | ORAL | Status: DC
  Administered 2011-04-08 – 2011-04-16 (×9): 10 mg via ORAL
  Filled 2011-04-08 (×9): qty 1

## 2011-04-08 MED ORDER — HYDROMORPHONE HCL PF 1 MG/ML IJ SOLN
0.2500 mg | INTRAMUSCULAR | Status: DC | PRN
  Administered 2011-04-08 (×2): 0.5 mg via INTRAVENOUS

## 2011-04-08 MED ORDER — NEOSTIGMINE METHYLSULFATE 1 MG/ML IJ SOLN
INTRAMUSCULAR | Status: DC | PRN
  Administered 2011-04-08: 5 mg via INTRAVENOUS

## 2011-04-08 MED ORDER — ERTAPENEM SODIUM 1 G IJ SOLR
1.0000 g | INTRAMUSCULAR | Status: DC
  Administered 2011-04-08 – 2011-04-15 (×8): 1 g via INTRAVENOUS
  Filled 2011-04-08 (×9): qty 1

## 2011-04-08 MED ORDER — ONDANSETRON HCL 4 MG/2ML IJ SOLN: 4.0000 mg | Freq: Four times a day (QID) | INTRAMUSCULAR | Status: DC | PRN

## 2011-04-08 MED ORDER — SODIUM CHLORIDE 0.9 % IJ SOLN: 9.0000 mL | INTRAMUSCULAR | Status: DC | PRN

## 2011-04-08 MED ORDER — DIPHENHYDRAMINE HCL 50 MG/ML IJ SOLN: 12.5000 mg | Freq: Four times a day (QID) | INTRAMUSCULAR | Status: DC | PRN

## 2011-04-08 MED ORDER — MORPHINE SULFATE (PF) 1 MG/ML IV SOLN
Freq: Once | INTRAVENOUS | Status: AC
  Administered 2011-04-08: 01:00:00 via INTRAVENOUS
  Filled 2011-04-08: qty 25

## 2011-04-08 MED ORDER — FLUTICASONE PROPIONATE HFA 44 MCG/ACT IN AERO
2.0000 | INHALATION_SPRAY | Freq: Two times a day (BID) | RESPIRATORY_TRACT | Status: DC
  Administered 2011-04-08 – 2011-04-15 (×15): 2 via RESPIRATORY_TRACT
  Filled 2011-04-08: qty 10.6

## 2011-04-08 MED ORDER — MORPHINE SULFATE (PF) 1 MG/ML IV SOLN
INTRAVENOUS | Status: DC
  Administered 2011-04-08: 8 mg via INTRAVENOUS
  Administered 2011-04-08: 02:00:00 via INTRAVENOUS
  Administered 2011-04-08 (×2): 4 mg via INTRAVENOUS
  Administered 2011-04-08: 7 mg via INTRAVENOUS
  Administered 2011-04-08: 16 mg via INTRAVENOUS
  Administered 2011-04-09: 06:00:00 via INTRAVENOUS
  Administered 2011-04-09: 2 mg via INTRAVENOUS
  Administered 2011-04-09: 6 mg via INTRAVENOUS
  Administered 2011-04-09: 5.88 mg via INTRAVENOUS
  Administered 2011-04-09: 6 mg via INTRAVENOUS
  Administered 2011-04-09: 5 mg via INTRAVENOUS
  Administered 2011-04-09 – 2011-04-10 (×2): 6 mg via INTRAVENOUS
  Administered 2011-04-10: 4 mg via INTRAVENOUS
  Administered 2011-04-10: 2 mg via INTRAVENOUS
  Administered 2011-04-10: 3 mg via INTRAVENOUS
  Filled 2011-04-08 (×4): qty 25

## 2011-04-08 MED ORDER — ACETAMINOPHEN 325 MG PO TABS
650.0000 mg | ORAL_TABLET | Freq: Four times a day (QID) | ORAL | Status: DC | PRN
  Administered 2011-04-08: 650 mg via ORAL
  Filled 2011-04-08: qty 2

## 2011-04-08 NOTE — Transfer of Care (Signed)
Immediate Anesthesia Transfer of Care Note  Patient: Felicia Salinas  Procedure(s) Performed:  APPENDECTOMY LAPAROSCOPIC - Laparoscopic appendectomy turned into open appendectomy  Patient Location: PACU  Anesthesia Type: General  Level of Consciousness: sedated and responds to stimulation  Airway & Oxygen Therapy: Patient Spontanous Breathing and Patient connected to face mask oxygen  Post-op Assessment: Report given to PACU RN and Post -op Vital signs reviewed and stable  Post vital signs: Reviewed and stable Filed Vitals:   04/07/11 1915  BP: 126/89  Pulse: 74  Temp: 36.6 C  Resp: 19    Complications: No apparent anesthesia complications

## 2011-04-08 NOTE — Progress Notes (Signed)
1 Day Post-Op  Subjective: Feels OK pain controlled no nausea, not ambulated yet  Objective: Vital signs in last 24 hours: Temp:  [97.5 F (36.4 C)-99.1 F (37.3 C)] 99.1 F (37.3 C) (01/13 0616) Pulse Rate:  [70-95] 95  (01/13 0616) Resp:  [16-26] 18  (01/13 0757) BP: (90-144)/(46-89) 111/52 mmHg (01/13 0616) SpO2:  [95 %-100 %] 96 % (01/13 0757) Weight:  [235 lb (106.595 kg)] 235 lb (106.595 kg) (01/13 0411)    Intake/Output from previous day: 01/12 0701 - 01/13 0700 In: 2898 [I.V.:2873] Out: 975 [Urine:855; Drains:20; Blood:100] Intake/Output this shift:    General appearance: alert, no distress and morbidly obese Resp: clear to auscultation bilaterally GI: Soft, dressings dry and VAc IN PLACE. Tender at incision. JP thin and not much  Lab Results:   Basename 04/07/11 1436  WBC 8.4  HGB 13.5  HCT 39.0  PLT 377   BMET  Basename 04/08/11 0630 04/07/11 1436  NA 138 137  K 4.0 3.6  CL 103 100  CO2 27 27  GLUCOSE 116* 85  BUN 5* 6  CREATININE 0.65 0.64  CALCIUM 8.9 9.8   PT/INR No results found for this basename: LABPROT:2,INR:2 in the last 72 hours ABG No results found for this basename: PHART:2,PCO2:2,PO2:2,HCO3:2 in the last 72 hours  Studies/Results: Ct Abdomen Pelvis W Contrast  04/07/2011  *RADIOLOGY REPORT*  Clinical Data: Right lower quadrant pain.  Pain radiates into the right side.  Symptoms for 6 days.  CT ABDOMEN AND PELVIS WITH CONTRAST  Technique:  Multidetector CT imaging of the abdomen and pelvis was performed following the standard protocol during bolus administration of intravenous contrast.  Contrast: OMNIPAQUE IOHEXOL 300 MG/ML IV SOLN, 20mL OMNIPAQUE IOHEXOL 300 MG/ML IV SOLN  Comparison: None  Findings: Within the right lower quadrant, there is significant inflammatory change.  Medial to the cecum, what appears to be the appendix appears inflamed and dilated, measuring 1.8 cm.  There is significant stranding surrounding this structure.   Significant right lower quadrant adenopathy is identified, favored to be reactive.  The terminal ileum appears to be adjacent but less affected.  There is fluid within the right lower quadrant.  No evidence for free intraperitoneal air.  Lung bases are negative.  No focal abnormality is identified within the liver, spleen, pancreas, adrenal glands, or kidneys.  The gallbladder contains numerous calcified gallstones. No biliary ductal dilatation or pericholecystic fluid identified.  The uterus is present.  No adnexal mass. Visualized osseous structures have a normal appearance. No evidence for aortic aneurysm.  IMPRESSION:  1.  Significant inflammatory change within the right lower quadrant, associated with dilatation of probable appendix and adenopathy. The differential diagnosis includes acute appendicitis and Crohn disease.  The appearance favors acute appendicitis. 2. Multiple calcified gallstones within the gallbladder.  No other evidence for acute cholecystitis.  The findings were discussed with Dr. Patrica Duel on 04/07/2011 at 5:31 p.m.  Original Report Authenticated By: Patterson Hammersmith, M.D.    Anti-infectives: Anti-infectives     Start     Dose/Rate Route Frequency Ordered Stop   04/08/11 2000   ertapenem (INVANZ) 1 g in sodium chloride 0.9 % 50 mL IVPB        1 g 100 mL/hr over 30 Minutes Intravenous Every 24 hours 04/08/11 0217     04/07/11 2130   ertapenem (INVANZ) 1 g in sodium chloride 0.9 % 50 mL IVPB        1 g 100 mL/hr over 30 Minutes  Intravenous  Once 04/07/11 2020 04/07/11 2140          Assessment/Plan: s/p Procedure(s): APPENDECTOMY LAPAROSCOPIC d/c foley Will \\increase  ambulation today  LOS: 1 day    Felicia Salinas 04/08/2011

## 2011-04-08 NOTE — Anesthesia Postprocedure Evaluation (Signed)
Anesthesia Post Note  Patient: Felicia Salinas  Procedure(s) Performed:  APPENDECTOMY LAPAROSCOPIC - Laparoscopic appendectomy turned into open appendectomy  Anesthesia type: general  Patient location: PACU  Post pain: Pain level controlled  Post assessment: Patient's Cardiovascular Status Stable  Last Vitals:  Filed Vitals:   04/08/11 0051  BP:   Pulse: 79  Temp:   Resp: 20    Post vital signs: Reviewed and stable  Level of consciousness: sedated  Complications: No apparent anesthesia complications

## 2011-04-08 NOTE — Op Note (Signed)
Preoperative diagnosis: Likely perforated appendicitis Postoperative diagnosis: Gangrenous appendicitis  Procedure: #1 Attempted laparoscopic appendectomy #2 Open appendectomy Surgeon: Dr. Harden Mo Asst.: Dr. Estelle Grumbles Anesthesia: Gen. Supervising anesthesiologist Dr. Isidor Holts Estimated blood loss: Minimal Drains: 19 French Blake drain to right lower quadrant Sponge and needle count correct x2 and operation Disposition of patient to recovery room in stable condition Specimens: Appendix to pathology  Indications: This is a 21 year old female who had about 5-6 days of abdominal pain that is progressively settled in her right lower quadrant. She has a normal white count no fever. She had an exam consistent with likely appendicitis with some significant tenderness in the right lower quadrant as well as the right mid abdomen. She had a CT scan that showed what appeared to be appendicitis with some reactive nodes that was also read as possible Crohn's disease. I thought she clinically had acute appendicitis. This may very well have perforated given the time course and the appearance on CT scan. There was no abscess amenable to drainage or anything that I thought was amenable to conservative therapy so she and I discussed going to the operating room for laparoscopic possible open appendectomy. We discussed all of this and benefits associated with this prior to beginning.  Procedure: After informed consent was obtained the patient was taken to the operating room. She was administered 1 g of intravenous ertapenem. Sequential compression devices were placed on her lower extremities prior to induction with anesthesia. She was then placed under general endotracheal anesthesia without complication. An orogastric tube and Foley catheter were placed. She was then prepped and draped in the standard sterile surgical fashion. Surgical timeout was performed.  I infiltrated quarter percent Marcaine  below her umbilicus. I then made a vertical incision. I grasped her fascia. With some difficulty due her very large abdominal wall I eventually was able to enter the peritoneum bluntly. There is no evidence of an entry injury. I then placed a Vicryl pursestring suture. I inserted a Hasson trocar and insufflated the abdomen to 15 mm mercury pressure. I then placed an additional 5 mm port in the left lower quadrant and 2 in the upper abdomen. Her body habitus made this very difficult to work with. On inserting this the omentum was stuck in the right lower quadrant which was removed bluntly. The terminal ileum was very adherent to her pelvic sidewall with some difficulty I then released this. The terminal ileum had evidence of reactive adenopathy and was very inflamed. There is no evidence of any Crohn's disease however. Once I was able to rotate the terminal ileum medially I then found a lower rock hard mass sitting on the psoas muscle. I was able to for about 45 minutes to dissect this with a combination of blunt and sharp dissection. I traced this up towards the terminal ileum but the tissue and ability to discriminate between structures became so poor that I was unable to really perform this through the laparoscope although I had a very good view. I was very concerned about injuring the cecum with the small bowel at that point. I was not really able to tell what structures were what when I got into this very large abscess cavity. I was also concerned this might be something besides an appendicitis due to its appearance. I elected at that point to make a midline incision around umbilicus. The midline incision umbilicus and inserted a retractor. There was a mass once I palpated the right lower quadrant about the size  of 10 x 10 cm. This was consistent with a dead, gangrenous appendix associated with some purulence as well as a very large rind. With great difficulty I eventually was able to rotate the colon medially  after taking down the white line of Toldt I was able to mobilize the appendix manually. I would not have been able to do this with the laparoscope. Eventually with some difficulty I was able to bring the appendix up and visualized the base and the cecum. The cecum was actually quite healthy as was the terminal ileum once I had brought that up. I then stapled across the base of the appendix which included a cuff of the cecum which appeared to be healthy tissue. Due to the fact that this was consistent back in his rind which I did not remove I then oversewed my staple line with Vicryl suture. I then tacked a piece of fat overlying my staple line to separate it from the prior cavity. There was no evidence of any injury to the bowel during this procedure. I then irrigated copiously. I then placed a 16 [place] Blake drain into the cavity. I secured this with 2-0 nylon. Then closed her peritoneum with a 0 Vicryl. Her fascia was closed with #1 loop PDS. I approximated her umbilicus as well as a 5 mm ports with staples. I then placed a VAC on her incision due to my concern for a postoperative infection especially given her body habitus. She tolerated this well was extubated and transferred to recovery in stable condition.

## 2011-04-09 ENCOUNTER — Encounter (HOSPITAL_COMMUNITY): Payer: Self-pay | Admitting: General Surgery

## 2011-04-09 LAB — GC/CHLAMYDIA PROBE AMP, GENITAL: GC Probe Amp, Genital: NEGATIVE

## 2011-04-09 NOTE — ED Provider Notes (Signed)
Medical screening examination/treatment/procedure(s) were conducted as a shared visit with non-physician practitioner(s) and myself.  I personally evaluated the patient during the encounter   Janiece Scovill A. Patrica Duel, MD 04/09/11 2325

## 2011-04-09 NOTE — Progress Notes (Signed)
No flatus. Tolerating clears. Soft, obese, mild incisional soreness. jp-serosang.  OOB, PT. Clears for now Cont abx x 1 week  I saw the patient, participated in the history, exam and medical decision making, and concur with the physician assistant's note above.  Mary Sella. Andrey Campanile, MD, FACS General, Bariatric, & Minimally Invasive Surgery [facility], [place]

## 2011-04-09 NOTE — Progress Notes (Signed)
Physical Therapy Evaluation Patient Details Name: Felicia Salinas MRN: [id number] DOB: 23-Feb-1991 Today's Date: 04/09/2011  Problem List:  Patient Active Problem List  Diagnoses  . Acute appendicitis with localized peritonitis    Past Medical History:  Past Medical History  Diagnosis Date  . Asthma   . Environmental allergies   . Acute appendicitis with localized peritonitis 04/08/2011   Past Surgical History:  Past Surgical History  Procedure Date  . Tonsillectomy   . Laparoscopic appendectomy 04/07/2011    Procedure: APPENDECTOMY LAPAROSCOPIC;  Surgeon: Emelia Loron, MD;  Location: [facility] OR;  Service: General;  Laterality: N/A;  Laparoscopic appendectomy turned into open appendectomy    PT Assessment/Plan/Recommendation PT Assessment Clinical Impression Statement: 21 year old s/p appendectomy who presents with decreased functional mobility. Pt will benefit from improved mobility and endurance to promote return to prior level of function. Pt will need to perform stairs.  PT Recommendation/Assessment: Patient will need skilled PT in the acute care venue PT Problem List: Decreased strength;Decreased activity tolerance;Decreased mobility;Decreased knowledge of use of DME;Pain;Obesity Barriers to Discharge: None PT Therapy Diagnosis : Difficulty walking;Abnormality of gait;Generalized weakness;Acute pain PT Plan PT Frequency: Min 3X/week PT Treatment/Interventions: DME instruction;Gait training;Stair training;Functional mobility training;Therapeutic activities;Therapeutic exercise;Patient/family education PT Recommendation Follow Up Recommendations: Home health PT (vs. No PT follow up) Equipment Recommended: Rolling walker with 5" wheels;3 in 1 bedside comode (pt desires 3n1) PT Goals  Acute Rehab PT Goals PT Goal Formulation: With patient Time For Goal Achievement: 2 weeks Pt will go Supine/Side to Sit: with modified independence PT Goal: Supine/Side to Sit - Progress: Not  met Pt will go Sit to Supine/Side: with modified independence PT Goal: Sit to Supine/Side - Progress: Not met Pt will go Sit to Stand: with modified independence PT Goal: Sit to Stand - Progress: Not met Pt will go Stand to Sit: Independently PT Goal: Stand to Sit - Progress: Not met Pt will Ambulate: >150 feet;with modified independence;with rolling walker PT Goal: Ambulate - Progress: Not met Pt will Go Up / Down Stairs: 3-5 stairs;with supervision;with least restrictive assistive device PT Goal: Up/Down Stairs - Progress: Not met  PT Evaluation Precautions/Restrictions  Precautions Precaution Comments: Wound vac, JP drain.  Restrictions Weight Bearing Restrictions: No Prior Functioning  Home Living Lives With: Family (mother) Receives Help From: Family Type of Home: House Home Layout: Two level;Able to live on main level with bedroom/bathroom Alternate Level Stairs-Number of Steps: Flight Home Access: Stairs to enter Entrance Stairs-Rails: Right Entrance Stairs-Number of Steps: 2-3 Bathroom Shower/Tub: Engineer, manufacturing systems: Standard Bathroom Accessibility: Yes How Accessible: Accessible via walker Home Adaptive Equipment: None Prior Function Level of Independence: Independent with basic ADLs;Independent with gait Driving: Yes Vocation: Consulting civil engineer (and employment) Cognition Cognition Orientation Level: Oriented X4 Sensation/Coordination Sensation Light Touch: Appears Intact Coordination Gross Motor Movements are Fluid and Coordinated: Yes Fine Motor Movements are Fluid and Coordinated: Yes Extremity Assessment RLE Assessment RLE Assessment: Within Functional Limits LLE Assessment LLE Assessment: Within Functional Limits Mobility (including Balance) Bed Mobility Bed Mobility: Yes Rolling Left: 4: Min assist Rolling Left Details (indicate cue type and reason): Rt. sidelying to supine, assist for LEs. Cues for pain modulation.  Sit to Sidelying Right: 3:  Mod assist;HOB elevated (comment degrees);With rail (HOB ~30 degrees) Sit to Sidelying Right Details (indicate cue type and reason): Assist for bil. LEs, cues for pain modulation and efficiency.  Transfers Transfers: Yes Sit to Stand: 5: Supervision Sit to Stand Details (indicate cue type and reason): Pt required  2 attempts, verbal cues for LE and UE positioning and scooting to edge of chair with anterior weight shift for improved ease of standing.  Stand to Sit: 5: Supervision;With upper extremity assist;To bed Stand to Sit Details: Verbal cues to control descent.  Ambulation/Gait Ambulation/Gait: Yes Ambulation/Gait Assistance: Other (comment) (min-guard assist) Ambulation/Gait Assistance Details (indicate cue type and reason): Min-guard assist for safety, verbal cues for controlled breathing. Verbal and tactile cues for upright posture.  Ambulation Distance (Feet): 55 Feet Assistive device: Rolling walker Gait Pattern: Step-through pattern;Trunk flexed;Decreased stride length Gait velocity: Very slow cadence Stairs: No  Posture/Postural Control Posture/Postural Control: No significant limitations Balance Balance Assessed: Yes Static Sitting Balance Static Sitting - Balance Support: No upper extremity supported Static Sitting - Level of Assistance: 7: Independent Static Standing Balance Static Standing - Balance Support: Left upper extremity supported Static Standing - Level of Assistance: 5: Stand by assistance Exercise  General Exercises - Lower Extremity Ankle Circles/Pumps: AROM;10 reps;Supine End of Session PT - End of Session Equipment Utilized During Treatment: Gait belt Activity Tolerance: Patient tolerated treatment well;Patient limited by fatigue Patient left: in bed;with call bell in reach (per pt request (up for 3-4 hours already)) Nurse Communication: Mobility status for ambulation General Behavior During Session: [facility] for tasks performed Cognition: [facility] for tasks  performed  Sherie Don 04/09/2011, 5:50 PM  Sherie Don) Carleene Mains PT, DPT Acute Rehabilitation [phone]

## 2011-04-09 NOTE — Progress Notes (Signed)
2 Days Post-Op  Subjective: Pt feels ok. Sore as expected. Belching, no flatus Voiding well without foley.  Objective: Vital signs in last 24 hours: Temp:  [98.6 F (37 C)-99.6 F (37.6 C)] 99.1 F (37.3 C) (01/14 0602) Pulse Rate:  [92-110] 92  (01/14 0602) Resp:  [18-22] 20  (01/14 0809) BP: (97-133)/(42-74) 115/70 mmHg (01/14 0602) SpO2:  [93 %-100 %] 100 % (01/14 0809) FiO2 (%):  [100 %] 100 % (01/14 0809)    Intake/Output this shift:    Physical Exam: BP 115/70  Pulse 92  Temp(Src) 99.1 F (37.3 C) (Oral)  Resp 20  Ht 5\' 5"  (1.651 m)  Wt 106.595 kg (235 lb)  BMI 39.11 kg/m2  SpO2 100%  LMP 03/28/2011 Lungs: CTA without w/r/r Heart: Regular Abdomen: soft, ND, appropriately tender   Incisional Vac in place. without erythema or    Hematoma.   Drain intact, serosanguinous output. Ext: No edema or tenderness   Labs: CBC  Basename 04/08/11 0630 04/07/11 1436  WBC 12.2* 8.4  HGB 11.6* 13.5  HCT 34.5* 39.0  PLT 323 377   BMET  Basename 04/08/11 0630 04/07/11 1436  NA 138 137  K 4.0 3.6  CL 103 100  CO2 27 27  GLUCOSE 116* 85  BUN 5* 6  CREATININE 0.65 0.64  CALCIUM 8.9 9.8   LFT  Basename 04/07/11 1436  PROT 8.1  ALBUMIN 3.8  AST 16  ALT 15  ALKPHOS 55  BILITOT 0.3  BILIDIR --  IBILI --  LIPASE 33   PT/INR No results found for this basename: LABPROT:2,INR:2 in the last 72 hours ABG No results found for this basename: PHART:2,PCO2:2,PO2:2,HCO3:2 in the last 72 hours  Studies/Results: Ct Abdomen Pelvis W Contrast  04/07/2011  *RADIOLOGY REPORT*  Clinical Data: Right lower quadrant pain.  Pain radiates into the right side.  Symptoms for 6 days.  CT ABDOMEN AND PELVIS WITH CONTRAST  Technique:  Multidetector CT imaging of the abdomen and pelvis was performed following the standard protocol during bolus administration of intravenous contrast.  Contrast: OMNIPAQUE IOHEXOL 300 MG/ML IV SOLN, 20mL OMNIPAQUE IOHEXOL 300 MG/ML IV SOLN   Comparison: None  Findings: Within the right lower quadrant, there is significant inflammatory change.  Medial to the cecum, what appears to be the appendix appears inflamed and dilated, measuring 1.8 cm.  There is significant stranding surrounding this structure.  Significant right lower quadrant adenopathy is identified, favored to be reactive.  The terminal ileum appears to be adjacent but less affected.  There is fluid within the right lower quadrant.  No evidence for free intraperitoneal air.  Lung bases are negative.  No focal abnormality is identified within the liver, spleen, pancreas, adrenal glands, or kidneys.  The gallbladder contains numerous calcified gallstones. No biliary ductal dilatation or pericholecystic fluid identified.  The uterus is present.  No adnexal mass. Visualized osseous structures have a normal appearance. No evidence for aortic aneurysm.  IMPRESSION:  1.  Significant inflammatory change within the right lower quadrant, associated with dilatation of probable appendix and adenopathy. The differential diagnosis includes acute appendicitis and Crohn disease.  The appearance favors acute appendicitis. 2. Multiple calcified gallstones within the gallbladder.  No other evidence for acute cholecystitis.  The findings were discussed with Dr. Patrica Duel on 04/07/2011 at 5:31 p.m.  Original Report Authenticated By: Patterson Hammersmith, M.D.    Assessment: Active Problems:  Acute appendicitis with localized peritonitis   Procedure(s): APPENDECTOMY LAPAROSCOPIC  Plan: Continue clears only  until increased bowel function Encouraged IS and OOB/walking Continue Abx  LOS: 2 days    Felicia Salinas 04/09/2011

## 2011-04-10 LAB — CBC
HCT: 31.6 % — ABNORMAL LOW (ref 36.0–46.0)
MCV: 91.3 fL (ref 78.0–100.0)
Platelets: 306 10*3/uL (ref 150–400)
RBC: 3.46 MIL/uL — ABNORMAL LOW (ref 3.87–5.11)
WBC: 6.8 10*3/uL (ref 4.0–10.5)

## 2011-04-10 MED ORDER — HYDROMORPHONE 0.3 MG/ML IV SOLN
INTRAVENOUS | Status: DC
  Administered 2011-04-10: 1.39 mg via INTRAVENOUS
  Administered 2011-04-10: 1.2 mg via INTRAVENOUS
  Administered 2011-04-10: 0.8 mg via INTRAVENOUS
  Administered 2011-04-10: 0.3 mg via INTRAVENOUS
  Administered 2011-04-11: 0.3 mL via INTRAVENOUS
  Administered 2011-04-11: 0.599 mg via INTRAVENOUS
  Administered 2011-04-11: 0.6 mg via INTRAVENOUS
  Administered 2011-04-11: 15:00:00 via INTRAVENOUS
  Administered 2011-04-11: 0.6 mg via INTRAVENOUS
  Administered 2011-04-11: 1 mg via INTRAVENOUS
  Administered 2011-04-11: 0.399 mg via INTRAVENOUS
  Administered 2011-04-12: 0.599 mg via INTRAVENOUS
  Administered 2011-04-12: 0.2 mg via INTRAVENOUS
  Filled 2011-04-10 (×2): qty 25

## 2011-04-10 MED ORDER — DIPHENHYDRAMINE HCL 12.5 MG/5ML PO ELIX
12.5000 mg | ORAL_SOLUTION | Freq: Four times a day (QID) | ORAL | Status: DC | PRN
  Filled 2011-04-10: qty 5

## 2011-04-10 MED ORDER — DIPHENHYDRAMINE HCL 50 MG/ML IJ SOLN: 12.5000 mg | Freq: Four times a day (QID) | INTRAMUSCULAR | Status: DC | PRN

## 2011-04-10 MED ORDER — ONDANSETRON HCL 4 MG/2ML IJ SOLN
4.0000 mg | Freq: Four times a day (QID) | INTRAMUSCULAR | Status: DC | PRN
  Administered 2011-04-10 – 2011-04-11 (×2): 4 mg via INTRAVENOUS
  Filled 2011-04-10 (×2): qty 2

## 2011-04-10 MED ORDER — DOCUSATE SODIUM 100 MG PO CAPS
100.0000 mg | ORAL_CAPSULE | Freq: Two times a day (BID) | ORAL | Status: DC
  Administered 2011-04-10 – 2011-04-16 (×13): 100 mg via ORAL
  Filled 2011-04-10 (×13): qty 1

## 2011-04-10 MED ORDER — NALOXONE HCL 0.4 MG/ML IJ SOLN: 0.4000 mg | INTRAMUSCULAR | Status: DC | PRN

## 2011-04-10 MED ORDER — SODIUM CHLORIDE 0.9 % IJ SOLN: 9.0000 mL | INTRAMUSCULAR | Status: DC | PRN

## 2011-04-10 NOTE — Progress Notes (Signed)
Physical Therapy Treatment Patient Details Name: Felicia Salinas MRN: [id number] DOB: 1990-10-09 Today's Date: 04/10/2011  PT Assessment/Plan  PT - Assessment/Plan Comments on Treatment Session: Pt progressing well with ambulation and is motivated. Mother present and very helpful with assisting patient.  PT Plan: Discharge plan remains appropriate PT Frequency: Min 3X/week Follow Up Recommendations: Home health PT Equipment Recommended: Rolling walker with 5" wheels;3 in 1 bedside comode PT Goals  Acute Rehab PT Goals PT Goal: Supine/Side to Sit - Progress: Progressing toward goal PT Goal: Sit to Supine/Side - Progress: Progressing toward goal PT Goal: Sit to Stand - Progress: Progressing toward goal PT Goal: Stand to Sit - Progress: Progressing toward goal PT Goal: Ambulate - Progress: Progressing toward goal  PT Treatment Precautions/Restrictions  Precautions Precaution Comments: Wound VAC and JP drain Restrictions Weight Bearing Restrictions: No Mobility (including Balance) Bed Mobility Rolling Right: 4: Min assist;With rail Rolling Right Details (indicate cue type and reason): A to facilitate trunk rotation and to A with LEs Right Sidelying to Sit: 4: Min assist;HOB flat;With rails Right Sidelying to Sit Details (indicate cue type and reason): A to guide shoulders and to position LEs off of bed.  Transfers Sit to Stand: 4: Min assist;Without upper extremity assist;From bed;From chair/3-in-1 Sit to Stand Details (indicate cue type and reason): A for safety and for stabilization. Cues for safe hand placement Stand to Sit: 5: Supervision;With armrests;To chair/3-in-1 Stand to Sit Details: Cues to sit slowly and for safe hand placement Ambulation/Gait Ambulation/Gait Assistance: 4: Min assist;Other (comment) (MinGuard A) Ambulation/Gait Assistance Details (indicate cue type and reason): Cues for upright posture and to increase step length.  Ambulation Distance (Feet): 110  Feet Assistive device: Rolling walker Gait Pattern: Step-to pattern;Decreased stride length;Shuffle Gait velocity: decreased    Exercise    End of Session PT - End of Session Activity Tolerance: Patient tolerated treatment well Patient left: in chair;with family/visitor present Nurse Communication: Mobility status for transfers;Mobility status for ambulation General Behavior During Session: [facility] for tasks performed Cognition: [facility] for tasks performed  Fredrich Birks 04/10/2011, 9:34 AM 04/10/2011 Fredrich Birks PTA [phone] pager [phone] office

## 2011-04-10 NOTE — Progress Notes (Signed)
3 Days Post-Op  Subjective: Pt feeling sore, says Morphine PCA not effective. No N/V, but still belching some. Passing some flatus per mom.  Objective: Vital signs in last 24 hours: Temp:  [98.2 F (36.8 C)-99.2 F (37.3 C)] 98.7 F (37.1 C) (01/15 0550) Pulse Rate:  [93-107] 102  (01/15 0550) Resp:  [16-20] 16  (01/15 0754) BP: (91-116)/(44-71) 116/64 mmHg (01/15 0550) SpO2:  [94 %-100 %] 100 % (01/15 0756) FiO2 (%):  [100 %] 100 % (01/15 0754) Last BM Date: 04/09/11  Intake/Output this shift:    Physical Exam: BP 116/64  Pulse 102  Temp(Src) 98.7 F (37.1 C) (Oral)  Resp 16  Ht 5\' 5"  (1.651 m)  Wt 106.595 kg (235 lb)  BMI 39.11 kg/m2  SpO2 100%  LMP 03/28/2011 Lungs: CTA without w/r/r Heart: Regular Abdomen: soft, ND, appropriately tender   Incisions all c/d/i without erythema or hematoma. Drain intact, serous output Ext: No edema or tenderness   Labs: CBC  Basename 04/10/11 0540 04/08/11 0630  WBC 6.8 12.2*  HGB 10.5* 11.6*  HCT 31.6* 34.5*  PLT 306 323   BMET  Basename 04/08/11 0630 04/07/11 1436  NA 138 137  K 4.0 3.6  CL 103 100  CO2 27 27  GLUCOSE 116* 85  BUN 5* 6  CREATININE 0.65 0.64  CALCIUM 8.9 9.8   LFT  Basename 04/07/11 1436  PROT 8.1  ALBUMIN 3.8  AST 16  ALT 15  ALKPHOS 55  BILITOT 0.3  BILIDIR --  IBILI --  LIPASE 33   PT/INR No results found for this basename: LABPROT:2,INR:2 in the last 72 hours ABG No results found for this basename: PHART:2,PCO2:2,PO2:2,HCO3:2 in the last 72 hours  Studies/Results: No results found.  Assessment: Active Problems:  Acute appendicitis with localized peritonitis   Procedure(s): APPENDECTOMY LAPAROSCOPIC  Plan: Change to Dilaudid PCA OOB!! Wd Vac changed today by myself and RN.  LOS: 3 days    Marianna Fuss 04/10/2011

## 2011-04-11 DIAGNOSIS — E669 Obesity, unspecified: Secondary | ICD-10-CM

## 2011-04-11 LAB — BASIC METABOLIC PANEL
CO2: 27 mEq/L (ref 19–32)
Chloride: 102 mEq/L (ref 96–112)
Creatinine, Ser: 0.62 mg/dL (ref 0.50–1.10)

## 2011-04-11 LAB — MAGNESIUM: Magnesium: 1.7 mg/dL (ref 1.5–2.5)

## 2011-04-11 MED ORDER — HYDROCODONE-ACETAMINOPHEN 5-325 MG PO TABS
1.0000 | ORAL_TABLET | ORAL | Status: DC | PRN
  Administered 2011-04-11: 1 via ORAL
  Administered 2011-04-12 – 2011-04-14 (×8): 2 via ORAL
  Administered 2011-04-14: 1 via ORAL
  Administered 2011-04-15 – 2011-04-16 (×4): 2 via ORAL
  Filled 2011-04-11 (×14): qty 2

## 2011-04-11 MED ORDER — BIOTENE DRY MOUTH MT LIQD
15.0000 mL | Freq: Two times a day (BID) | OROMUCOSAL | Status: DC
  Administered 2011-04-13 – 2011-04-15 (×5): 15 mL via OROMUCOSAL

## 2011-04-11 MED ORDER — BISACODYL 10 MG RE SUPP
10.0000 mg | Freq: Once | RECTAL | Status: AC
  Administered 2011-04-11: 10 mg via RECTAL
  Filled 2011-04-11: qty 1

## 2011-04-11 MED ORDER — SODIUM CHLORIDE 0.9 % IJ SOLN
10.0000 mL | INTRAMUSCULAR | Status: DC | PRN
  Administered 2011-04-12 – 2011-04-16 (×4): 10 mL

## 2011-04-11 MED ORDER — CHLORHEXIDINE GLUCONATE 0.12 % MT SOLN
15.0000 mL | Freq: Two times a day (BID) | OROMUCOSAL | Status: DC
  Administered 2011-04-11 – 2011-04-15 (×7): 15 mL via OROMUCOSAL
  Filled 2011-04-11 (×6): qty 15

## 2011-04-11 NOTE — Progress Notes (Signed)
Pt vomited large amount of green emesis.  Faint bowel sounds.  Administered IV Zofran-will continue to monitor.

## 2011-04-11 NOTE — Progress Notes (Signed)
4 Days Post-Op  Subjective: Pt had an episode of vomiting last night.  Nurse (per note) and patient describe it as a green color.  Nurse this morning states that night nurse described it as a bile color.  Patient denies any nauseausness yesterday prior to episode, but still is having belching.  No gas or BM's yet.  Tolerated diet well most of yesterday and ambulated with PT and a walker.  Pt still has some pain in her upper and lower left quadrants of her abdomen, but only on palpation.   Objective: Vital signs in last 24 hours: Temp:  [98.1 F (36.7 C)-98.5 F (36.9 C)] 98.1 F (36.7 C) (01/16 0635) Pulse Rate:  [88-99] 99  (01/16 0635) Resp:  [16-22] 16  (01/16 0635) BP: (112-130)/(70-83) 130/77 mmHg (01/16 0635) SpO2:  [93 %-100 %] 100 % (01/16 0635) Last BM Date: 04/07/11  Intake/Output this shift:    Physical Exam: BP 130/77  Pulse 99  Temp(Src) 98.1 F (36.7 C) (Oral)  Resp 16  Ht 5\' 5"  (1.651 m)  Wt 106.595 kg (235 lb)  BMI 39.11 kg/m2  SpO2 100%  LMP 03/28/2011 General: Well developed, obese female resting in bed in NAD. Heart: RRR Abdomen: Soft, non-distended, tender (pain 6/10 on light palpation of ULQ and LLQ).  Wound vac site intact without erythema.  Drain intact with serosanguinous fluid in drain.    Labs: CBC  Basename 04/10/11 0540  WBC 6.8  HGB 10.5*  HCT 31.6*  PLT 306   BMET No results found for this basename: NA:2,K:2,CL:2,CO2:2,GLUCOSE:2,BUN:2,CREATININE:2,CALCIUM:2 in the last 72 hours LFT No results found for this basename: PROT,ALBUMIN,AST,ALT,ALKPHOS,BILITOT,BILIDIR,IBILI,LIPASE in the last 72 hours PT/INR No results found for this basename: LABPROT:2,INR:2 in the last 72 hours ABG No results found for this basename: PHART:2,PCO2:2,PO2:2,HCO3:2 in the last 72 hours  Studies/Results: No results found.  Assessment: Active Problems:  Acute appendicitis with localized peritonitis Probable mild post-op ileus  Procedure(s): APPENDECTOMY  LAPAROSCOPIC  Plan: Continue OOB ambulation with walker and/or PT Continue current diet, but advised to back off if increased nausea or bloating. Monitor for more emesis.  Notify PA of another occurrence. Will check lytes  LOS: 4 days    Marianna Fuss 04/11/2011

## 2011-04-11 NOTE — Progress Notes (Signed)
No more n/v today. No flatus. abd soft, obese, mild distension. Hypobs. Drain-serous  Ileus - await return of bowel fxn I saw the patient, participated in the history, exam and medical decision making, and concur with the physician assistant's note above.  Mary Sella. Andrey Campanile, MD, FACS General, Bariatric, & Minimally Invasive Surgery [facility], [place]

## 2011-04-11 NOTE — Progress Notes (Signed)
UR of chart completed.  

## 2011-04-12 MED ORDER — WHITE PETROLATUM GEL
Status: AC
  Administered 2011-04-12: 11:00:00
  Filled 2011-04-12: qty 5

## 2011-04-12 MED ORDER — KCL IN DEXTROSE-NACL 20-5-0.45 MEQ/L-%-% IV SOLN
INTRAVENOUS | Status: DC
  Administered 2011-04-12: 10:00:00 via INTRAVENOUS
  Administered 2011-04-13: 100 mL/h via INTRAVENOUS
  Administered 2011-04-13 – 2011-04-14 (×4): via INTRAVENOUS
  Filled 2011-04-12 (×7): qty 1000

## 2011-04-12 MED ORDER — HYDROMORPHONE HCL PF 1 MG/ML IJ SOLN
1.0000 mg | INTRAMUSCULAR | Status: DC | PRN
  Administered 2011-04-12: 1 mg via INTRAVENOUS
  Filled 2011-04-12: qty 1

## 2011-04-12 NOTE — Progress Notes (Signed)
Felicia Klayman M. Maneh Sieben, MD, FACS General, Bariatric, & Minimally Invasive Surgery [facility] [place]  

## 2011-04-12 NOTE — Progress Notes (Signed)
5 Days Post-Op  Subjective: Pt still says she is having some pain diffusely in her stomach.  She does state she had a BM this morning which was a diarrhea consistency, but denies any gas.  No more N/V since yesterday. Says she is hungry and would like to try more.  Only got out of bed twice yesterday.  Nurse feels she is not very motivated to move or advance her recovery.  Pt is still using the bedside commode and having nurses wipe her after urinating.  Feels she needs a "motivational talk" to promote recovery process.  Objective: Vital signs in last 24 hours: Temp:  [98.8 F (37.1 C)-100.1 F (37.8 C)] 99.3 F (37.4 C) (01/17 0607) Pulse Rate:  [98-117] 108  (01/17 0607) Resp:  [18-20] 20  (01/17 0607) BP: (124-145)/(68-87) 124/72 mmHg (01/17 0607) SpO2:  [95 %-100 %] 98 % (01/17 0607) FiO2 (%):  [98 %] 98 % (01/17 0008) Last BM Date:  (pre-op, given supp awaiting results)  Intake/Output this shift: Total I/O In: -  Out: 25 [Drains:25]  Physical Exam: BP 124/72  Pulse 108  Temp(Src) 99.3 F (37.4 C) (Oral)  Resp 20  Ht 5\' 5"  (1.651 m)  Wt 106.595 kg (235 lb)  BMI 39.11 kg/m2  SpO2 98%  LMP 03/28/2011 General: Well developed, obese female, lethargic, in bed. Abdomen:  Soft, mildly distended, mildly tender (5/10).  Wound without erythema around vac site.  Drain with serous fluid drainage.  Labs: CBC  Basename 04/10/11 0540  WBC 6.8  HGB 10.5*  HCT 31.6*  PLT 306   BMET  Basename 04/11/11 0948  NA 139  K 3.3*  CL 102  CO2 27  GLUCOSE 103*  BUN 4*  CREATININE 0.62  CALCIUM 8.8   LFT No results found for this basename: PROT,ALBUMIN,AST,ALT,ALKPHOS,BILITOT,BILIDIR,IBILI,LIPASE in the last 72 hours PT/INR No results found for this basename: LABPROT:2,INR:2 in the last 72 hours ABG No results found for this basename: PHART:2,PCO2:2,PO2:2,HCO3:2 in the last 72 hours  Studies/Results: No results found.  Assessment: Active Problems:  Acute appendicitis with  localized peritonitis  Obesity (BMI 30-39.9) Hypokalemia   Procedure(s): APPENDECTOMY LAPAROSCOPIC  Plan: OOB ambulation; PT to come see again Incentive Spirometry Will advance to fulls diet. Monitor BM's with possible administration of another Dulcolax suppository if none occur today. D/C PCA today and switch to PO analgesia. Consider OT referral and evaluation for extra motivation. Discussed motivation with patient to get better. The longer she lays around in bed, the more she is at risk for complications such as PNA, DVT, UTI.  LOS: 5 days    Marianna Fuss 04/12/2011

## 2011-04-12 NOTE — Progress Notes (Signed)
Felicia Heyde M. Steffie Waggoner, MD, FACS General, Bariatric, & Minimally Invasive Surgery [facility] [place]  

## 2011-04-12 NOTE — Progress Notes (Signed)
Physical Therapy Treatment Patient Details Name: Felicia Salinas MRN: [id number] DOB: May 11, 1990 Today's Date: 04/12/2011  PT Assessment/Plan  PT - Assessment/Plan Comments on Treatment Session: Noted MDs concerns about patient being self limited. Pt agreeable to get up with therapy and ambulate but patient did require some encouragement. Pt ambulated to bathroom and was able to perforn hygiene without assistance. Patient encouraged to ambulate with staff and family as much as possible and edicated on benefits of increased activity. Pt will need to practice steps next session PT Plan: Discharge plan remains appropriate PT Frequency: Min 3X/week Follow Up Recommendations: Home health PT Equipment Recommended: Rolling walker with 5" wheels;3 in 1 bedside comode PT Goals  Acute Rehab PT Goals PT Goal: Supine/Side to Sit - Progress: Progressing toward goal PT Goal: Sit to Stand - Progress: Progressing toward goal PT Goal: Stand to Sit - Progress: Progressing toward goal PT Goal: Ambulate - Progress: Progressing toward goal  PT Treatment Precautions/Restrictions  Precautions Precaution Comments: Wound VAC and JP drain Restrictions Weight Bearing Restrictions: No Mobility (including Balance) Bed Mobility Rolling Left: 5: Supervision Rolling Left Details (indicate cue type and reason): cues for positioning.  Left Sidelying to Sit: 5: Supervision;With rails;HOB elevated (comment degrees) (HOB elevated 20 degrees) Left Sidelying to Sit Details (indicate cue type and reason): Cues for positioning and technique to limit pain in abdomen Transfers Sit to Stand: 5: Supervision;From bed;From chair/3-in-1;With armrests;With upper extremity assist Sit to Stand Details (indicate cue type and reason): Cues for safe technique Stand to Sit: 5: Supervision;To chair/3-in-1;With upper extremity assist;With armrests Stand to Sit Details: Cues to be back all the way back to BSC/Recliner before reaching back  and sitting.  Ambulation/Gait Ambulation/Gait Assistance: 5: Supervision Ambulation/Gait Assistance Details (indicate cue type and reason): No cues required. One standing rest break Ambulation Distance (Feet): 180 Feet Assistive device: Rolling walker Gait Pattern: Step-to pattern;Decreased stride length Gait velocity: decreased. Stairs:  (Will need to attempt next session)    Exercise    End of Session PT - End of Session Equipment Utilized During Treatment: Other (comment) (did not use gait belt due to abdominal wound vac) Activity Tolerance: Patient tolerated treatment well Patient left: in chair;with call bell in reach Nurse Communication: Mobility status for transfers;Mobility status for ambulation General Behavior During Session: [facility] for tasks performed Cognition: [facility] for tasks performed  Robinette, Adline Potter 04/12/2011, 10:01 AM 04/12/2011 Fredrich Birks PTA [phone] pager [phone] office

## 2011-04-13 ENCOUNTER — Inpatient Hospital Stay (HOSPITAL_COMMUNITY): Payer: BC Managed Care – PPO

## 2011-04-13 LAB — CBC
HCT: 30.2 % — ABNORMAL LOW (ref 36.0–46.0)
HCT: 30.7 % — ABNORMAL LOW (ref 36.0–46.0)
Hemoglobin: 10.5 g/dL — ABNORMAL LOW (ref 12.0–15.0)
Hemoglobin: 10.4 g/dL — ABNORMAL LOW (ref 12.0–15.0)
MCH: 30.5 pg (ref 26.0–34.0)
MCHC: 33.9 g/dL (ref 30.0–36.0)
MCV: 89.3 fL (ref 78.0–100.0)
RBC: 3.38 MIL/uL — ABNORMAL LOW (ref 3.87–5.11)
RBC: 3.41 MIL/uL — ABNORMAL LOW (ref 3.87–5.11)
WBC: 20.2 10*3/uL — ABNORMAL HIGH (ref 4.0–10.5)

## 2011-04-13 LAB — BASIC METABOLIC PANEL
CO2: 27 mEq/L (ref 19–32)
Calcium: 8.8 mg/dL (ref 8.4–10.5)
Chloride: 100 mEq/L (ref 96–112)
Glucose, Bld: 134 mg/dL — ABNORMAL HIGH (ref 70–99)
Potassium: 3.3 mEq/L — ABNORMAL LOW (ref 3.5–5.1)
Sodium: 137 mEq/L (ref 135–145)

## 2011-04-13 LAB — MAGNESIUM: Magnesium: 1.8 mg/dL (ref 1.5–2.5)

## 2011-04-13 MED ORDER — BOOST / RESOURCE BREEZE PO LIQD
1.0000 | Freq: Two times a day (BID) | ORAL | Status: DC
  Administered 2011-04-13 – 2011-04-16 (×5): 1 via ORAL

## 2011-04-13 MED ORDER — ALUM & MAG HYDROXIDE-SIMETH 200-200-20 MG/5ML PO SUSP
30.0000 mL | Freq: Four times a day (QID) | ORAL | Status: DC | PRN
  Administered 2011-04-13 – 2011-04-14 (×2): 30 mL via ORAL
  Filled 2011-04-13 (×2): qty 30

## 2011-04-13 MED ORDER — POTASSIUM CHLORIDE 10 MEQ/100ML IV SOLN
10.0000 meq | INTRAVENOUS | Status: AC
  Administered 2011-04-13 (×4): 10 meq via INTRAVENOUS
  Filled 2011-04-13 (×4): qty 100

## 2011-04-13 MED ORDER — ENSURE IMMUNE HEALTH PO LIQD
237.0000 mL | Freq: Two times a day (BID) | ORAL | Status: DC
  Administered 2011-04-14: 237 mL via ORAL
  Administered 2011-04-14: 10:00:00 via ORAL
  Administered 2011-04-15 – 2011-04-16 (×3): 237 mL via ORAL

## 2011-04-13 NOTE — Progress Notes (Signed)
INITIAL ADULT NUTRITION ASSESSMENT Date: 04/13/2011   Time: 2:44 PM  Reason for Assessment: NPO/CL x 6 days  ASSESSMENT: Female 21 y.o.  Dx: Acute appendicitis- appendectomy 04/07/11  Hx: asthma, allergies including chicken, shrimp, dairy products, shellfish, and eggs - severe reaction per pt but no reaction if eggs or dairy in minute amounts  Related Meds: Biotene, Peridex, Colace, Invanz, Claritin, KCL  Ht: 5\' 5"  (165.1 cm)  Wt: 235 lb (106.595 kg)  Ideal Wt: 57 kg  % Ideal Wt: 187  Usual Wt: 268# % Usual Wt: 88  Body mass index is 39.11 kg/(m^2).  Food/Nutrition Related Hx: Regular diet-allergic to eggs, dairy products, chicken, shrimp, shellfish.  Tolerates minute amounts of dairy and eggs in products without reaction.  Labs: CMP     Component Value Date/Time   NA 137 04/13/2011 1000   K 3.3* 04/13/2011 1000   CL 100 04/13/2011 1000   CO2 27 04/13/2011 1000   GLUCOSE 134* 04/13/2011 1000   BUN 3* 04/13/2011 1000   CREATININE 0.60 04/13/2011 1000   CALCIUM 8.8 04/13/2011 1000   PROT 8.1 04/07/2011 1436   ALBUMIN 3.8 04/07/2011 1436   AST 16 04/07/2011 1436   ALT 15 04/07/2011 1436   ALKPHOS 55 04/07/2011 1436   BILITOT 0.3 04/07/2011 1436   GFRNONAA >90 04/13/2011 1000   GFRAA >90 04/13/2011 1000    Intake:I/O last 3 completed shifts: In: 500 [I.V.:400; IV Piggyback:100] Out: 2116 [Urine:2051; Drains:65] Total I/O In: 1161.7 [I.V.:1061.7; IV Piggyback:100] Out: 965 [Urine:900; Drains:65]   Diet Order: NPO (sips and chips)  Supplements/Tube Feeding:  none  IVF:    dextrose 5 % and 0.45 % NaCl with KCl 20 mEq/L Last Rate: 100 mL/hr at 04/13/11 1357    Estimated Nutritional Needs:per day   Kcal: 1900-2000 kcals Protein: 85-105 g protein Fluid: >2L  Pt reports BM today.    NUTRITION DIAGNOSIS: -Inadequate oral intake (NI-2.1).  Status: Ongoing  RELATED TO: Altered GI fx  AS EVIDENCE BY: NPO/CL diet  MONITORING/EVALUATION(Goals): Tolerate diet  advancement with intake to meet estimated needs  EDUCATION NEEDS: -No education needs identified at this time  INTERVENTION: 1.  Diet advancement per MD 2.  Pt allergic to dairy.   3.  Monitor diet advancement and tolerance  Dietitian [phone]  DOCUMENTATION CODES Per approved criteria  -Obesity Unspecified    Derrell Lolling Anastasia Fiedler 04/13/2011, 2:44 PM

## 2011-04-13 NOTE — Progress Notes (Signed)
6 Days Post-Op  Subjective: Feeling uncomfortable and bloated. Not much flatus, no more BMs, only BM was after suppository. She is not vomiting but does not feel like eating. She knows she needs to walk  Objective: Vital signs in last 24 hours: Temp:  [97.9 F (36.6 C)-99.3 F (37.4 C)] 97.9 F (36.6 C) (01/18 0600) Pulse Rate:  [99-115] 114  (01/18 0600) Resp:  [18-20] 18  (01/18 0600) BP: (128-144)/(70-82) 129/74 mmHg (01/18 0600) SpO2:  [94 %-100 %] 94 % (01/18 0600) Last BM Date:  (pre-op, given supp awaiting results)  Intake/Output this shift:    Physical Exam: BP 129/74  Pulse 114  Temp(Src) 97.9 F (36.6 C) (Oral)  Resp 18  Ht 5\' 5"  (1.651 m)  Wt 106.595 kg (235 lb)  BMI 39.11 kg/m2  SpO2 94%  LMP 03/28/2011 Lungs: CTA without w/r/r Heart: Regular Abdomen: soft, slightly more distended today. BS quiet.   Wd clean, vac intact, drain intact, SS output. Ext: No edema or tenderness   Labs: CBC No results found for this basename: WBC:2,HGB:2,HCT:2,PLT:2 in the last 72 hours BMET  Felicia Salinas 04/11/11 0948  NA 139  K 3.3*  CL 102  CO2 27  GLUCOSE 103*  BUN 4*  CREATININE 0.62  CALCIUM 8.8   LFT No results found for this basename: PROT,ALBUMIN,AST,ALT,ALKPHOS,BILITOT,BILIDIR,IBILI,LIPASE in the last 72 hours PT/INR No results found for this basename: LABPROT:2,INR:2 in the last 72 hours ABG No results found for this basename: PHART:2,PCO2:2,PO2:2,HCO3:2 in the last 72 hours  Studies/Results: No results found.  Assessment: Active Problems:  Acute appendicitis with localized peritonitis  Obesity (BMI 30-39.9) Post op ileus  Procedure(s): APPENDECTOMY LAPAROSCOPIC  Plan: Will check labs, xray Back down to sips and chips. OOB/Walk  LOS: 6 days    Felicia Salinas 04/13/2011

## 2011-04-13 NOTE — Progress Notes (Signed)
PT Cancellation Note  Treatment cancelled today due to patient's refusal to participate.  Felicia Salinas 04/13/2011, 4:28 PM

## 2011-04-13 NOTE — Plan of Care (Signed)
Problem: Discharge Progression Outcomes Goal: Barriers To Progression Addressed/Resolved Elevated WBC's, slow progression w/diet

## 2011-04-13 NOTE — Progress Notes (Signed)
Nutrition Consult regarding supplements and multiple food allergies.  Full Assessment 04/13/11  Spoke with patient further.    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  Will order Resource bid, Ensure bid as tolerated Told patient that soy milk is available when diet is advanced.  Oran Rein, Rd [phone]

## 2011-04-13 NOTE — Progress Notes (Signed)
abd soft, obese, mild ttp. HypoBS. Drain-serosang  Wbc 20 today - ?accuracy - will repeat If truly 20 will check ct sat to look for abscesses Cont iv abx for per appy Will try resource shakes. If ileus persists thru weekend, will need TPN first of the week  Mary Sella. Andrey Campanile, MD, FACS General, Bariatric, & Minimally Invasive Surgery [facility], [place]

## 2011-04-14 LAB — BASIC METABOLIC PANEL
BUN: 3 mg/dL — ABNORMAL LOW (ref 6–23)
CO2: 28 mEq/L (ref 19–32)
Calcium: 8.6 mg/dL (ref 8.4–10.5)
GFR calc non Af Amer: >90 mL/min (ref >90)
Glucose, Bld: 112 mg/dL — ABNORMAL HIGH (ref 70–99)

## 2011-04-14 LAB — CBC
Hemoglobin: 10 g/dL — ABNORMAL LOW (ref 12.0–15.0)
MCH: 30.3 pg (ref 26.0–34.0)
MCHC: 33.7 g/dL (ref 30.0–36.0)
MCV: 90 fL (ref 78.0–100.0)
Platelets: 349 10*3/uL (ref 150–400)

## 2011-04-14 MED ORDER — POTASSIUM CHLORIDE CRYS ER 20 MEQ PO TBCR
30.0000 meq | EXTENDED_RELEASE_TABLET | Freq: Two times a day (BID) | ORAL | Status: AC
  Administered 2011-04-14 (×2): 30 meq via ORAL
  Filled 2011-04-14 (×2): qty 1

## 2011-04-14 NOTE — Progress Notes (Signed)
7 Days Post-Op  Subjective: Pt feels ok. Menses have begun. Pain about same, maybe better. "Just sore" Voiding ok and OOB more  Objective: Vital signs in last 24 hours: Temp:  [99.1 F (37.3 C)-99.9 F (37.7 C)] 99.9 F (37.7 C) (01/19 0555) Pulse Rate:  [101-111] 108  (01/19 0555) Resp:  [17-18] 18  (01/19 0555) BP: (117-131)/(55-85) 117/55 mmHg (01/19 0555) SpO2:  [90 %-100 %] 90 % (01/19 0803) Last BM Date: 05/11/11  Intake/Output this shift: Total I/O In: -  Out: 500 [Urine:500]  Physical Exam: BP 117/55  Pulse 108  Temp(Src) 99.9 F (37.7 C) (Oral)  Resp 18  Ht 5\' 5"  (1.651 m)  Wt 106.595 kg (235 lb)  BMI 39.11 kg/m2  SpO2 90%  LMP 03/28/2011 Lungs: CTA without w/r/r Heart: Regular Abdomen: soft, ND, mildly tender mid abdomen.   Incision clean, wd vac intact Ext: No edema or tenderness   Labs: CBC  Basename 04/14/11 0500 05/11/2011 1605  WBC 18.4* 19.8*  HGB 10.0* 10.4*  HCT 29.7* 30.7*  PLT 349 367   BMET  Basename 04/14/11 0500 2011-05-11 1000  NA 138 137  K 3.3* 3.3*  CL 101 100  CO2 28 27  GLUCOSE 112* 134*  BUN 3* 3*  CREATININE 0.56 0.60  CALCIUM 8.6 8.8   LFT No results found for this basename: PROT,ALBUMIN,AST,ALT,ALKPHOS,BILITOT,BILIDIR,IBILI,LIPASE in the last 72 hours PT/INR No results found for this basename: LABPROT:2,INR:2 in the last 72 hours ABG No results found for this basename: PHART:2,PCO2:2,PO2:2,HCO3:2 in the last 72 hours  Studies/Results: Dg Abd 2 Views  2011-05-11  *RADIOLOGY REPORT*  Clinical Data: Postop abdominal pain  ABDOMEN - 2 VIEW  Comparison: CT abdomen and pelvis of 04/06/2036  Findings: Supine and erect views of the abdomen show large and small bowel gas to be present with no bowel obstruction.  No free air is seen on the erect view.  A surgical drain overlies the mid lower abdomen.  IMPRESSION: No bowel obstruction.  No free air.  Original Report Authenticated By: Juline Patch, M.D.    Assessment: Active  Problems:  Acute appendicitis with localized peritonitis  Obesity (BMI 30-39.9) Mild post-op ileus  Procedure(s): APPENDECTOMY LAPAROSCOPIC  Plan: WBC spiked up yesterday but has now trended down some, pain stable and no real fever. Will d/w MD regarding CT D/W pt potential plans.  LOS: 7 days    Marianna Fuss 04/14/2011

## 2011-04-14 NOTE — Progress Notes (Signed)
Some pain. +flatus and BM per pt. Tolerating resource shake  Abd soft, obese, mild ttp. Drain serous  Will advance to full liquid Cont IV abx Cont wound vac Will repeat CBC in am, if remains elevated will get CT.  However, clinically pt looks better.   Mary Sella. Andrey Campanile, MD, FACS General, Bariatric, & Minimally Invasive Surgery [facility], [place]

## 2011-04-15 LAB — CBC
HCT: 30.3 % — ABNORMAL LOW (ref 36.0–46.0)
MCHC: 33 g/dL (ref 30.0–36.0)
MCV: 91.8 fL (ref 78.0–100.0)
Platelets: 329 10*3/uL (ref 150–400)
RDW: 12.5 % (ref 11.5–15.5)
WBC: 12.6 10*3/uL — ABNORMAL HIGH (ref 4.0–10.5)

## 2011-04-15 LAB — BASIC METABOLIC PANEL
BUN: 4 mg/dL — ABNORMAL LOW (ref 6–23)
Chloride: 103 mEq/L (ref 96–112)
Creatinine, Ser: 0.61 mg/dL (ref 0.50–1.10)
GFR calc Af Amer: >90 mL/min (ref >90)
GFR calc non Af Amer: >90 mL/min (ref >90)

## 2011-04-15 NOTE — Progress Notes (Signed)
Patient seen and examined.  Agree with PA's note.  

## 2011-04-15 NOTE — Progress Notes (Signed)
8 Days Post-Op  Subjective: Looks and feels much better. Has had a few more BMs. Tol fulls diet. Pain control good.   Objective: Vital signs in last 24 hours: Temp:  [98.8 F (37.1 C)-99.2 F (37.3 C)] 98.8 F (37.1 C) (01/20 0705) Pulse Rate:  [95-100] 95  (01/20 0705) Resp:  [17-18] 18  (01/20 0705) BP: (110-127)/(58-78) 127/78 mmHg (01/20 0705) SpO2:  [95 %-97 %] 97 % (01/20 0705) FiO2 (%):  [99 %] 99 % (01/20 0730) Last BM Date: 2011/04/16  Intake/Output this shift:    Physical Exam: BP 127/78  Pulse 95  Temp(Src) 98.8 F (37.1 C) (Oral)  Resp 18  Ht 5\' 5"  (1.651 m)  Wt 106.595 kg (235 lb)  BMI 39.11 kg/m2  SpO2 97%  LMP 03/28/2011 Lungs: CTA without w/r/r Heart: Regular Abdomen: soft, ND, appropriately tender   Vac intact, no leak.   Drain intact serous output only, trending down. Ext: No edema or tenderness   Labs: CBC  Basename 04/15/11 0630 04/14/11 0500  WBC 12.6* 18.4*  HGB 10.0* 10.0*  HCT 30.3* 29.7*  PLT 329 349   BMET  Basename 04/15/11 0630 04/14/11 0500  NA 137 138  K 3.7 3.3*  CL 103 101  CO2 26 28  GLUCOSE 100* 112*  BUN 4* 3*  CREATININE 0.61 0.56  CALCIUM 8.9 8.6   LFT No results found for this basename: PROT,ALBUMIN,AST,ALT,ALKPHOS,BILITOT,BILIDIR,IBILI,LIPASE in the last 72 hours PT/INR No results found for this basename: LABPROT:2,INR:2 in the last 72 hours ABG No results found for this basename: PHART:2,PCO2:2,PO2:2,HCO3:2 in the last 72 hours  Studies/Results: Dg Abd 2 Views  2011-04-16  *RADIOLOGY REPORT*  Clinical Data: Postop abdominal pain  ABDOMEN - 2 VIEW  Comparison: CT abdomen and pelvis of 04/06/2036  Findings: Supine and erect views of the abdomen show large and small bowel gas to be present with no bowel obstruction.  No free air is seen on the erect view.  A surgical drain overlies the mid lower abdomen.  IMPRESSION: No bowel obstruction.  No free air.  Original Report Authenticated By: Juline Patch, M.D.     Assessment: Active Problems:  Acute appendicitis with localized peritonitis  Obesity (BMI 30-39.9)   Procedure(s): APPENDECTOMY LAPAROSCOPIC  Plan: WBC down to 12 Clinically looks much better. Will advance diet Decrease IVF Plan for drain removal, Vac removal and DC home tomorrow.  LOS: 8 days    Felicia Salinas 04/15/2011

## 2011-04-16 ENCOUNTER — Telehealth (INDEPENDENT_AMBULATORY_CARE_PROVIDER_SITE_OTHER): Payer: Self-pay | Admitting: General Surgery

## 2011-04-16 MED ORDER — HYDROCODONE-ACETAMINOPHEN 5-325 MG PO TABS: 1.0000 | ORAL_TABLET | ORAL | Status: AC | PRN

## 2011-04-16 NOTE — Progress Notes (Signed)
3-in-1 bedside commode arranged for pt through [facility]. No other HH needs.

## 2011-04-16 NOTE — Progress Notes (Signed)
Demonstrated and instructed on how to do a wet to dry dressing change to pt's mother.  Pt's mother verbalized understanding and had no questions.  Hector Shade [place]

## 2011-04-16 NOTE — Plan of Care (Signed)
Problem: Discharge Progression Outcomes Goal: Other Discharge Outcomes/Goals Outcome: Completed/Met Date Met:  04/16/11 VAC D/C'd and wet to dry dressing applied.  JP drain D/C'd

## 2011-04-16 NOTE — Progress Notes (Signed)
Agree with PTA.    Francena Zender, PT [phone]  

## 2011-04-16 NOTE — Progress Notes (Signed)
9 Days Post-Op  Subjective: Doing well. No new issues. Walking well. Had another BM  Objective: Vital signs in last 24 hours: Temp:  [98.8 F (37.1 C)-99.3 F (37.4 C)] 99.3 F (37.4 C) (01/21 0547) Pulse Rate:  [91-103] 94  (01/21 0547) Resp:  [17-18] 18  (01/21 0547) BP: (117-129)/(63-88) 117/63 mmHg (01/21 0547) SpO2:  [93 %-97 %] 93 % (01/21 0547) Last BM Date: 04/15/11  Intake/Output this shift:    Physical Exam: BP 117/63  Pulse 94  Temp(Src) 99.3 F (37.4 C) (Oral)  Resp 18  Ht 5\' 5"  (1.651 m)  Wt 106.595 kg (235 lb)  BMI 39.11 kg/m2  SpO2 93%  LMP 03/28/2011 Lungs: CTA without w/r/r Heart: Regular Abdomen: soft, ND, appropriately tender   Incisions all c/d/i without erythema or hematoma.   Drain with scant serous output Ext: No edema or tenderness   Labs: CBC  Basename 04/15/11 0630 04/14/11 0500  WBC 12.6* 18.4*  HGB 10.0* 10.0*  HCT 30.3* 29.7*  PLT 329 349   BMET  Basename 04/15/11 0630 04/14/11 0500  NA 137 138  K 3.7 3.3*  CL 103 101  CO2 26 28  GLUCOSE 100* 112*  BUN 4* 3*  CREATININE 0.61 0.56  CALCIUM 8.9 8.6   LFT No results found for this basename: PROT,ALBUMIN,AST,ALT,ALKPHOS,BILITOT,BILIDIR,IBILI,LIPASE in the last 72 hours PT/INR No results found for this basename: LABPROT:2,INR:2 in the last 72 hours ABG No results found for this basename: PHART:2,PCO2:2,PO2:2,HCO3:2 in the last 72 hours  Studies/Results: No results found.  Assessment: Active Problems:  Acute appendicitis with localized peritonitis  Obesity (BMI 30-39.9)   Procedure(s): APPENDECTOMY LAPAROSCOPIC  Plan: DC Vac and drain today. Instruct mother NS W-D to mother. DC later today.  LOS: 9 days    Marianna Fuss 04/16/2011

## 2011-04-16 NOTE — Discharge Summary (Signed)
Physician Discharge Summary  Patient ID: Felicia Salinas MRN: [id number] DOB/AGE: 1990/09/22 21 y.o.  Admit date: 04/07/2011 Discharge date: 04/16/2011  Admission Diagnoses: Acute appendicitis Discharge Diagnoses:  Active Problems:  Acute appendicitis with localized peritonitis  Obesity (BMI 30-39.9)  Procedure(s): Laparoscopic, converted to open appendectomy.  Discharged Condition: good  Hospital Course:  HPI:  21 yof with right sided abdominal pain since Monday/Tuesday. This has progressed and is not being relieved at this point. Aggravated by movement. No fevers, no chills, no n/v, having normal bowel movements. No prior history of any ab pain. No diarrhea/constipation. She has some anorexia. No dysuria or urinary symptoms. No change in menstrual periods.  She presented to Er and workup found evidence of appendicitis and the patient was determined to have peritonitis concerning for perforation or abscess. Summary of Hospital Course: After evaluation in the ER, Dr. Dwain Sarna admitted the patient on 04/07/11 for urgent surgical intervention. She was taken to the OR and underwent Laparoscopic, converted to open appendectomy(see OP note for details). A drain was placed as there was concern for post-op abscess. She was transferred to the floor in stable condition. She had a somewhat rocky course. She had a mild ileus that took several days to recover. Her WBC spiked up to 20k and a CT was considered but the pt looked great clinically. Her activity and bowel function improved, as did her pain. Since her pain was better, no fever, and the WBC immediately trended back down, the CT was deferred. Her drain output has been serous with diminished output.  She has had a wound vac on her wound change 3x/weekly since surgery. Her wound has looked good without infection. On the day of discharge, her vac was discontinued and her drain was removed. She is stable for discharge.  Consults:  none   Discharge Exam: Blood pressure 117/63, pulse 94, temperature 99.3 F (37.4 C), temperature source Oral, resp. rate 18, height 5\' 5"  (1.651 m), weight 106.595 kg (235 lb), last menstrual period 03/28/2011, SpO2 93.00%. Lungs: CTA without w/r/r Heart: Regular Abdomen: soft, ND, appropriately tender   Incisions all c/d/i without erythema or hematoma. Ext: No edema or tenderness   Disposition: To home  Discharge Orders    Future Orders Please Complete By Expires   Diet - low sodium heart healthy      Increase activity slowly      May shower / Bathe      Lifting restrictions      Comments:   No heavy lifting, pushing, pulling more than 10-15 lbs for 5 weeks.   Discharge wound care:      Comments:   Pack abdominal wounds with saline moistened gauze, cover with dry gauze. Change daily. Showering is okay as water may run over wound, but do not submerge in a tub.   Call MD for:  redness, tenderness, or signs of infection (pain, swelling, redness, odor or green/yellow discharge around incision site)      Call MD for:  severe uncontrolled pain      Call MD for:  persistant nausea and vomiting      Call MD for:  temperature >100.4        Current Discharge Medication List    START taking these medications   Details  HYDROcodone-acetaminophen (NORCO) 5-325 MG per tablet Take 1-2 tablets by mouth every 4 (four) hours as needed. Qty: 30 tablet, Refills: 0      CONTINUE these medications which have NOT CHANGED   Details  albuterol (PROVENTIL HFA;VENTOLIN HFA) 108 (90 BASE) MCG/ACT inhaler Inhale 2 puffs into the lungs every 4 (four) hours as needed. For shortness of breath    beclomethasone (QVAR) 40 MCG/ACT inhaler Inhale 2 puffs into the lungs 2 (two) times daily.    FEXOFENADINE HCL PO Take 1 tablet by mouth every morning.     LORATADINE PO Take 1 tablet by mouth at bedtime.        Follow-up Information    Follow up with [facility], MD. Schedule an appointment as  soon as possible for a visit in 1 week.   Contact information:   [facility] [address] [place] [address] [phone]          Signed: Marianna Fuss 04/16/2011, 9:39 AM

## 2011-04-16 NOTE — Progress Notes (Signed)
Discharge instructions reviewed with pt and pt's mother and prescription given.    Pt and mother verbalized understanding and had no questions.  3 in 1 and rolling walker has been delivered to pt's room.  Pt discharged in stable condition via wheelchair with mother.  [name]

## 2011-04-16 NOTE — Discharge Summary (Signed)
Agree with discharge summary.

## 2011-04-16 NOTE — Progress Notes (Signed)
Physical Therapy Treatment Patient Details Name: Felicia Salinas MRN: [id number] DOB: Jan 19, 1991 Today's Date: 04/16/2011  PT Assessment/Plan  PT - Assessment/Plan Comments on Treatment Session: Pt contiuing to progress well with therapy. Pt hopeful to DC this afternoon per her report and MD notes.  PT Plan: All goals met and education completed, patient dischaged from PT services Follow Up Recommendations: Home health PT Equipment Recommended: 3 in 1 bedside comode PT Goals  Acute Rehab PT Goals PT Goal: Supine/Side to Sit - Progress: Met PT Goal: Sit to Supine/Side - Progress: Met PT Goal: Sit to Stand - Progress: Met PT Goal: Stand to Sit - Progress: Met PT Goal: Ambulate - Progress: Met PT Goal: Up/Down Stairs - Progress: Met  PT Treatment Precautions/Restrictions  Precautions Precaution Comments: Wound VAC and JP drain Restrictions Weight Bearing Restrictions: No Mobility (including Balance) Bed Mobility Rolling Right: 6: Modified independent (Device/Increase time) Right Sidelying to Sit: 6: Modified independent (Device/Increase time) Sit to Sidelying Right: 6: Modified independent (Device/Increase time) Transfers Sit to Stand: 7: Independent Stand to Sit: 7: Independent Ambulation/Gait Ambulation/Gait Assistance: 6: Modified independent (Device/Increase time) Ambulation Distance (Feet): 250 Feet Assistive device: None Gait Pattern: Antalgic Gait velocity: decreased but increasing with increased distance.  Stairs: Yes Stairs Assistance: 6: Modified independent (Device/Increase time) Stair Management Technique: One rail Left Number of Stairs: 3     Exercise    End of Session PT - End of Session Activity Tolerance: Patient tolerated treatment well Patient left: in bed;with call bell in reach General Behavior During Session: [facility] for tasks performed Cognition: [facility] for tasks performed  Fredrich Birks 04/16/2011, 11:35 AM 04/16/2011 Fredrich Birks PTA [phone] pager [phone] office

## 2011-04-16 NOTE — Progress Notes (Signed)
Patient seen and examined.  Agree with PA's note.  

## 2011-04-17 ENCOUNTER — Telehealth (INDEPENDENT_AMBULATORY_CARE_PROVIDER_SITE_OTHER): Payer: Self-pay

## 2011-04-17 NOTE — Telephone Encounter (Signed)
LMOM for pt giving her the f/u appt with Dr Dwain Sarna on 04-23-11 @4 :00.

## 2011-04-18 ENCOUNTER — Telehealth (INDEPENDENT_AMBULATORY_CARE_PROVIDER_SITE_OTHER): Payer: Self-pay

## 2011-04-18 NOTE — Telephone Encounter (Signed)
The patient's mother called and states the Vicodin is not helping the pain but makes her sleepy.  She had an appendectomy 1/11 and just went home Monday 1/21.  She has no other concerns besides little appetite.  She uses [facility] on [address].  I spoke to Dr Dwain Sarna and he advised Motrin or Aleve along with the Vicodin.  I notified the mother.

## 2011-04-23 ENCOUNTER — Encounter (INDEPENDENT_AMBULATORY_CARE_PROVIDER_SITE_OTHER): Payer: Self-pay | Admitting: General Surgery

## 2011-04-23 ENCOUNTER — Ambulatory Visit (INDEPENDENT_AMBULATORY_CARE_PROVIDER_SITE_OTHER): Payer: BC Managed Care – PPO | Admitting: General Surgery

## 2011-04-23 VITALS — BP 126/74 | HR 90 | Ht 64.0 in | Wt 252.2 lb

## 2011-04-23 DIAGNOSIS — Z09 Encounter for follow-up examination after completed treatment for conditions other than malignant neoplasm: Secondary | ICD-10-CM

## 2011-04-23 MED ORDER — OXYCODONE-ACETAMINOPHEN 10-325 MG PO TABS: 1.0000 | ORAL_TABLET | ORAL | Status: DC | PRN

## 2011-04-23 NOTE — Progress Notes (Signed)
Addended byEmelia Loron on: 04/23/2011 05:05 PM   Modules accepted: Orders

## 2011-04-23 NOTE — Progress Notes (Signed)
Subjective:     Patient ID: Felicia Salinas, female   DOB: 1990-09-08, 21 y.o.   MRN: [id number]  HPI This is a 21 year old female who had a focal perforation and abscess of the appendix. I took her to the operating room and attempted to use with the laparoscope. This was unsuccessful and I ended up converting this to an open appendectomy through a midline incision. She had a long postoperative course but eventually was discharged home. Her pathology was consistent with  appendicitis. She returns today doing well. She is having bowel movements, eating well. She still has some pain and asked for a refill of her pain medication. She reports no other complaints today. She is change her dressing daily.  Review of Systems     Objective:   Physical Exam Healing midline wound with open areas above and below umbilicus, granulating I removed all staples today    Assessment:     Perforated appendicitis s/p elap     Plan:     Cont dressing changes I refilled Percocet May return to school in march for next semester May drive if not taking percocet RTC in 3 weeks.

## 2011-04-30 ENCOUNTER — Telehealth (INDEPENDENT_AMBULATORY_CARE_PROVIDER_SITE_OTHER): Payer: Self-pay

## 2011-05-01 NOTE — Telephone Encounter (Signed)
Patient called to report she's having mild irritation to tape, she will clean and dry the area, apply a different brand of tape and call our office if she continues to experience irritation.

## 2011-05-17 ENCOUNTER — Encounter (INDEPENDENT_AMBULATORY_CARE_PROVIDER_SITE_OTHER): Payer: BC Managed Care – PPO | Admitting: General Surgery

## 2011-05-21 ENCOUNTER — Encounter (INDEPENDENT_AMBULATORY_CARE_PROVIDER_SITE_OTHER): Payer: BC Managed Care – PPO | Admitting: General Surgery

## 2011-05-21 ENCOUNTER — Encounter (INDEPENDENT_AMBULATORY_CARE_PROVIDER_SITE_OTHER): Payer: Self-pay | Admitting: General Surgery

## 2011-05-21 ENCOUNTER — Ambulatory Visit (INDEPENDENT_AMBULATORY_CARE_PROVIDER_SITE_OTHER): Payer: BC Managed Care – PPO | Admitting: General Surgery

## 2011-05-21 VITALS — BP 118/76 | HR 64 | Temp 97.6°F | Resp 16 | Ht 64.0 in | Wt 252.2 lb

## 2011-05-21 DIAGNOSIS — Z09 Encounter for follow-up examination after completed treatment for conditions other than malignant neoplasm: Secondary | ICD-10-CM

## 2011-05-21 NOTE — Progress Notes (Signed)
Subjective:     Patient ID: Felicia Salinas, female   DOB: 05/27/1990, 21 y.o.   MRN: [id number]  HPI This is a 21 year old female underwent what eventually was an open appendectomy for perforated gangrenous appendicitis. She is at an open wound and returns today for a final check. Her energy does not completely normal at this point. Her appetite has also not completely return to normal. She is having bowel movements and is otherwise doing very well. She has no significant complaints today. Her wound at the inferior portion is not completely healed yet.  Review of Systems     Objective:   Physical Exam Healing incision with inferior most portion superficial opening with granulation tissue present    Assessment:     S/p open appendectomy    Plan:        I released her to full normal activity and work. I did silver nitrate her wound today. This hopefully will heal on its own . I asked her to call me if this does not heal. She is okay for  normal activity.

## 2011-05-25 ENCOUNTER — Telehealth (INDEPENDENT_AMBULATORY_CARE_PROVIDER_SITE_OTHER): Payer: Self-pay

## 2011-05-25 NOTE — Telephone Encounter (Signed)
Pt calling in stating that she has been having abdominal pain since last Friday and vomited one time. The pt is not running a fever or having chills. The pt had sx back in January from an appendectomy so I told her she was too far out for her symptoms to be coming from that sx I thoght. I advised pt to go see her medical doctor and she has an appt on Wednesday to see them. The pt said she would go to a walk-in clinic today.

## 2011-05-26 ENCOUNTER — Emergency Department (INDEPENDENT_AMBULATORY_CARE_PROVIDER_SITE_OTHER)
Admission: EM | Admit: 2011-05-26 | Discharge: 2011-05-26 | Disposition: A | Discharge status: Home / Self Care | Payer: BC Managed Care – PPO | Source: Home / Self Care | Attending: Family Medicine | Admitting: Family Medicine

## 2011-05-26 ENCOUNTER — Encounter (HOSPITAL_COMMUNITY): Payer: Self-pay | Admitting: Cardiology

## 2011-05-26 DIAGNOSIS — R112 Nausea with vomiting, unspecified: Secondary | ICD-10-CM

## 2011-05-26 DIAGNOSIS — A0811 Acute gastroenteropathy due to Norwalk agent: Secondary | ICD-10-CM

## 2011-05-26 LAB — LIPASE, BLOOD: Lipase: 24 U/L (ref 11–59)

## 2011-05-26 LAB — CBC
HCT: 39.3 % (ref 36.0–46.0)
Hemoglobin: 13.4 g/dL (ref 12.0–15.0)
MCH: 30.7 pg (ref 26.0–34.0)
MCV: 90.1 fL (ref 78.0–100.0)
RBC: 4.36 MIL/uL (ref 3.87–5.11)
WBC: 6.3 10*3/uL (ref 4.0–10.5)

## 2011-05-26 LAB — POCT I-STAT, CHEM 8
BUN: 5 mg/dL — ABNORMAL LOW (ref 6–23)
Calcium, Ion: 1.15 mmol/L (ref 1.12–1.32)
Chloride: 99 meq/L (ref 96–112)
Creatinine, Ser: 0.8 mg/dL (ref 0.50–1.10)
Glucose, Bld: 99 mg/dL (ref 70–99)
HCT: 43 % (ref 36.0–46.0)
Hemoglobin: 14.6 g/dL (ref 12.0–15.0)
Potassium: 3.7 meq/L (ref 3.5–5.1)
Sodium: 139 meq/L (ref 135–145)
TCO2: 30 mmol/L (ref 0–100)

## 2011-05-26 LAB — DIFFERENTIAL
Eosinophils Absolute: 0.1 10*3/uL (ref 0.0–0.7)
Eosinophils Relative: 2 % (ref 0–5)
Lymphocytes Relative: 25 % (ref 12–46)
Lymphs Abs: 1.6 10*3/uL (ref 0.7–4.0)
Monocytes Relative: 9 % (ref 3–12)

## 2011-05-26 LAB — POCT URINALYSIS DIP (DEVICE)
Hgb urine dipstick: NEGATIVE
Ketones, ur: 80 mg/dL — AB
Specific Gravity, Urine: 1.015 (ref 1.005–1.030)
pH: 7 (ref 5.0–8.0)

## 2011-05-26 MED ORDER — ONDANSETRON 4 MG PO TBDP
ORAL_TABLET | ORAL | Status: AC
  Filled 2011-05-26: qty 2

## 2011-05-26 MED ORDER — ONDANSETRON 4 MG PO TBDP
8.0000 mg | ORAL_TABLET | Freq: Once | ORAL | Status: AC
  Administered 2011-05-26: 8 mg via ORAL

## 2011-05-26 MED ORDER — RANITIDINE HCL 150 MG PO CAPS: ORAL_CAPSULE | ORAL | Status: DC

## 2011-05-26 MED ORDER — BISMUTH SUBSALICYLATE 262 MG/15ML PO SUSP: ORAL | Status: DC

## 2011-05-26 MED ORDER — ONDANSETRON 8 MG PO TBDP: 8.0000 mg | ORAL_TABLET | Freq: Three times a day (TID) | ORAL | Status: AC | PRN

## 2011-05-26 NOTE — Discharge Instructions (Signed)
B.R.A.T. Diet Your doctor has recommended the B.R.A.T. diet for you or your child until the condition improves. This is often used to help control diarrhea and vomiting symptoms. If you or your child can tolerate clear liquids, you may have:  Bananas.   Rice.   Applesauce.   Toast (and other simple starches such as crackers, potatoes, noodles).  Be sure to avoid dairy products, meats, and fatty foods until symptoms are better. Fruit juices such as apple, grape, and prune juice can make diarrhea worse. Avoid these. Continue this diet for 2 days or as instructed by your caregiver. Document Released: 03/12/2005 Document Revised: 11/22/2010 Document Reviewed: 08/29/2006 [facility] Patient Information 2012 [place], [place].Gastritis Gastritis is an irritation of the stomach. This is often caused by medications, but can be from anything that bothers the stomach. Other stomach irritants are:  Alcohol.   Caffeine.   Nicotine.   Spicy or acid foods.   Medications for pain and arthritis. Aspirin and other anti-inflammatory medicines such as ibuprofen (Advil), naproxen (Aleve), and ketoprofen (Orudis) can be highly irritating.   Emotional distress.  Symptoms of gastritis may include:  Abdominal pain.   Indigestion.   Nausea and or vomiting.   Bleeding.  Some patients with chronic gastritis and ulcers have been infected by a germ. They may need special testing. Medications which kill germs can be used to cure this condition. Treatment includes avoiding the substances mentioned above that are known to cause stomach trouble. Medications used to treat gastritis can include:  Antacids.   Medicines to control vomiting.   Acid blocking medicines.  Symptoms of gastritis usually improve within 2-3 days of starting treatment. Call your caregiver if you are not better in a few days. SEEK MEDICAL CARE IF:   You have increased stomach or chest pain.   You vomit blood.   You faint or feel  lightheaded.   You cannot keep fluids down.   You pass bloody or black stools.   You develop severe back pain.  MAKE SURE YOU:   Understand these instructions.   Will watch your condition.   Will get help right away if you are not doing well or get worse.  Document Released: 03/12/2005 Document Revised: 11/22/2010 Document Reviewed: 08/28/2006 [facility] Patient Information 2012 [place], [place].

## 2011-05-26 NOTE — ED Notes (Addendum)
Pt tolerating PO intake. During discharge instruction pt on the phone noted to acknowlege nursing instructions and nods head.

## 2011-05-26 NOTE — ED Notes (Addendum)
Pt reports nausea that started one week ago Friday. Vomiting started this yesterday. Denies fever. Attempting to eat but still feels nauseated.  Pt had a recent open appendectomy this past January.

## 2011-05-26 NOTE — ED Notes (Signed)
PO challenge initiated

## 2011-05-26 NOTE — ED Provider Notes (Signed)
History     CSN: [id number]  Arrival date & time 05/26/11  1657   First MD Initiated Contact with Patient 05/26/11 1713      Chief Complaint  Patient presents with  . Vomiting  . Nausea    (Consider location/radiation/quality/duration/timing/severity/associated sxs/prior treatment) Patient is a 21 y.o. female presenting with vomiting. The history is provided by the patient.  Emesis  This is a new problem. Episode onset: nausea for 8 days vomiting for 2 days. The problem occurs 2 to 4 times per day. The problem has not changed since onset.The emesis has an appearance of stomach contents. There has been no fever. Associated symptoms include abdominal pain.    Past Medical History  Diagnosis Date  . Asthma   . Environmental allergies   . Acute appendicitis with localized peritonitis 04/08/2011    Past Surgical History  Procedure Date  . Tonsillectomy   . Appendectomy 04/07/2011  . Wisdom tooth extraction     Family History  Problem Relation Age of Onset  . Cancer Maternal Grandmother     breast    History  Substance Use Topics  . Smoking status: Never Smoker   . Smokeless tobacco: Not on file  . Alcohol Use: Yes     Occasional    OB History    Grav Para Term Preterm Abortions TAB SAB Ect Mult Living                  Review of Systems  Gastrointestinal: Positive for nausea, vomiting and abdominal pain.  Genitourinary: Positive for frequency. Negative for dysuria, menstrual problem and dyspareunia.  All other systems reviewed and are negative.    Allergies  Apple; Benadryl; Eggs or egg-derived products; Iodine; Other; and Tape  Home Medications   Current Outpatient Rx  Name Route Sig Dispense Refill  . ALBUTEROL SULFATE HFA 108 (90 BASE) MCG/ACT IN AERS Inhalation Inhale 2 puffs into the lungs every 4 (four) hours as needed. For shortness of breath    . BECLOMETHASONE DIPROPIONATE 40 MCG/ACT IN AERS Inhalation Inhale 2 puffs into the lungs 2 (two) times  daily.    Marland Kitchen FEXOFENADINE HCL PO Oral Take 1 tablet by mouth every morning.     Marland Kitchen LORATADINE PO Oral Take 1 tablet by mouth at bedtime.       BP 130/95  Pulse 73  Temp(Src) 98.4 F (36.9 C) (Oral)  Resp 18  SpO2 99%  LMP 05/15/2011  Physical Exam  Constitutional: She is oriented to person, place, and time. She appears well-developed and well-nourished.  Neurological: She is alert and oriented to person, place, and time.  Skin: Skin is warm and dry.  Psychiatric: She has a normal mood and affect. Her behavior is normal.    ED Course  Procedures (including critical care time)   Results for orders placed during the hospital encounter of 05/26/11  POCT URINALYSIS DIP (DEVICE)      Component Value Range   Glucose, UA 100 (*) NEGATIVE (mg/dL)   Bilirubin Urine LARGE (*) NEGATIVE    Ketones, ur 80 (*) NEGATIVE (mg/dL)   Specific Gravity, Urine 1.015  1.005 - 1.030    Hgb urine dipstick NEGATIVE  NEGATIVE    pH 7.0  5.0 - 8.0    Protein, ur 30 (*) NEGATIVE (mg/dL)   Urobilinogen, UA 2.0 (*) 0.0 - 1.0 (mg/dL)   Nitrite NEGATIVE  NEGATIVE    Leukocytes, UA NEGATIVE  NEGATIVE   POCT PREGNANCY, URINE  Component Value Range   Preg Test, Ur NEGATIVE  NEGATIVE   CBC      Component Value Range   WBC 6.3  4.0 - 10.5 (K/uL)   RBC 4.36  3.87 - 5.11 (MIL/uL)   Hemoglobin 13.4  12.0 - 15.0 (g/dL)   HCT 65.7  84.6 - 96.2 (%)   MCV 90.1  78.0 - 100.0 (fL)   MCH 30.7  26.0 - 34.0 (pg)   MCHC 34.1  30.0 - 36.0 (g/dL)   RDW 95.2  84.1 - 32.4 (%)   Platelets 402 (*) 150 - 400 (K/uL)  DIFFERENTIAL      Component Value Range   Neutrophils Relative 63  43 - 77 (%)   Neutro Abs 4.0  1.7 - 7.7 (K/uL)   Lymphocytes Relative 25  12 - 46 (%)   Lymphs Abs 1.6  0.7 - 4.0 (K/uL)   Monocytes Relative 9  3 - 12 (%)   Monocytes Absolute 0.5  0.1 - 1.0 (K/uL)   Eosinophils Relative 2  0 - 5 (%)   Eosinophils Absolute 0.1  0.0 - 0.7 (K/uL)   Basophils Relative 1  0 - 1 (%)   Basophils Absolute  0.1  0.0 - 0.1 (K/uL)  LIPASE, BLOOD      Component Value Range   Lipase 24  11 - 59 (U/L)  POCT I-STAT, CHEM 8      Component Value Range   Sodium 139  135 - 145 (mEq/L)   Potassium 3.7  3.5 - 5.1 (mEq/L)   Chloride 99  96 - 112 (mEq/L)   BUN 5 (*) 6 - 23 (mg/dL)   Creatinine, Ser 4.01  0.50 - 1.10 (mg/dL)   Glucose, Bld 99  70 - 99 (mg/dL)   Calcium, Ion 0.27  2.53 - 1.32 (mmol/L)   TCO2 30  0 - 100 (mmol/L)   Hemoglobin 14.6  12.0 - 15.0 (g/dL)   HCT 66.4  40.3 - 47.4 (%)   Noro virus Nausea and vomiting    MDM          Hassan Rowan, MD 05/26/11 1920

## 2011-08-06 ENCOUNTER — Emergency Department (INDEPENDENT_AMBULATORY_CARE_PROVIDER_SITE_OTHER)
Admission: EM | Admit: 2011-08-06 | Discharge: 2011-08-06 | Disposition: A | Discharge status: Nursing Home (Includes ICF) | Payer: BC Managed Care – PPO | Source: Home / Self Care | Attending: Emergency Medicine | Admitting: Emergency Medicine

## 2011-08-06 ENCOUNTER — Emergency Department (HOSPITAL_COMMUNITY): Payer: BC Managed Care – PPO

## 2011-08-06 ENCOUNTER — Encounter (HOSPITAL_COMMUNITY): Payer: Self-pay | Admitting: Emergency Medicine

## 2011-08-06 ENCOUNTER — Emergency Department (INDEPENDENT_AMBULATORY_CARE_PROVIDER_SITE_OTHER): Payer: BC Managed Care – PPO

## 2011-08-06 ENCOUNTER — Encounter (HOSPITAL_COMMUNITY): Payer: Self-pay

## 2011-08-06 ENCOUNTER — Emergency Department (HOSPITAL_COMMUNITY)
Admission: EM | Admit: 2011-08-06 | Discharge: 2011-08-06 | Disposition: A | Discharge status: Home / Self Care | Payer: BC Managed Care – PPO | Attending: Emergency Medicine | Admitting: Emergency Medicine

## 2011-08-06 DIAGNOSIS — K810 Acute cholecystitis: Secondary | ICD-10-CM

## 2011-08-06 DIAGNOSIS — R17 Unspecified jaundice: Secondary | ICD-10-CM

## 2011-08-06 DIAGNOSIS — R110 Nausea: Secondary | ICD-10-CM | POA: Insufficient documentation

## 2011-08-06 DIAGNOSIS — J45909 Unspecified asthma, uncomplicated: Secondary | ICD-10-CM | POA: Insufficient documentation

## 2011-08-06 DIAGNOSIS — K8020 Calculus of gallbladder without cholecystitis without obstruction: Secondary | ICD-10-CM | POA: Insufficient documentation

## 2011-08-06 DIAGNOSIS — R1013 Epigastric pain: Secondary | ICD-10-CM | POA: Insufficient documentation

## 2011-08-06 DIAGNOSIS — R7989 Other specified abnormal findings of blood chemistry: Secondary | ICD-10-CM

## 2011-08-06 LAB — CBC
HCT: 39.1 % (ref 36.0–46.0)
Hemoglobin: 13.6 g/dL (ref 12.0–15.0)
MCH: 31 pg (ref 26.0–34.0)
MCHC: 34.8 g/dL (ref 30.0–36.0)
MCV: 89.1 fL (ref 78.0–100.0)
Platelets: 356 10*3/uL (ref 150–400)
RBC: 4.39 MIL/uL (ref 3.87–5.11)
RDW: 12.7 % (ref 11.5–15.5)
WBC: 6.1 10*3/uL (ref 4.0–10.5)

## 2011-08-06 LAB — WET PREP, GENITAL: WBC, Wet Prep HPF POC: NONE SEEN

## 2011-08-06 LAB — COMPREHENSIVE METABOLIC PANEL
ALT: 1178 U/L — ABNORMAL HIGH (ref 0–35)
AST: 1138 U/L — ABNORMAL HIGH (ref 0–37)
Albumin: 4 g/dL (ref 3.5–5.2)
Alkaline Phosphatase: 198 U/L — ABNORMAL HIGH (ref 39–117)
BUN: 6 mg/dL (ref 6–23)
CO2: 26 mEq/L (ref 19–32)
Calcium: 9.6 mg/dL (ref 8.4–10.5)
Chloride: 97 mEq/L (ref 96–112)
Creatinine, Ser: 0.66 mg/dL (ref 0.50–1.10)
GFR calc Af Amer: >90 mL/min (ref >90)
GFR calc non Af Amer: >90 mL/min (ref >90)
Glucose, Bld: 76 mg/dL (ref 70–99)
Potassium: 3.6 mEq/L (ref 3.5–5.1)
Sodium: 134 mEq/L — ABNORMAL LOW (ref 135–145)
Total Bilirubin: 2.1 mg/dL — ABNORMAL HIGH (ref 0.3–1.2)
Total Protein: 7.8 g/dL (ref 6.0–8.3)

## 2011-08-06 LAB — POCT URINALYSIS DIP (DEVICE)
Glucose, UA: NEGATIVE mg/dL
Ketones, ur: NEGATIVE mg/dL
Leukocytes, UA: NEGATIVE
Protein, ur: 30 mg/dL — AB
Urobilinogen, UA: 1 mg/dL (ref 0.0–1.0)

## 2011-08-06 LAB — POCT PREGNANCY, URINE: Preg Test, Ur: NEGATIVE

## 2011-08-06 LAB — PROTIME-INR
INR: 0.97 (ref 0.00–1.49)
Prothrombin Time: 13.1 seconds (ref 11.6–15.2)

## 2011-08-06 LAB — LIPASE, BLOOD: Lipase: 51 U/L (ref 11–59)

## 2011-08-06 MED ORDER — ONDANSETRON HCL 4 MG/2ML IJ SOLN: 4.0000 mg | Freq: Once | INTRAMUSCULAR | Status: DC

## 2011-08-06 MED ORDER — OXYCODONE HCL 5 MG PO TABS: 5.0000 mg | ORAL_TABLET | ORAL | Status: AC | PRN

## 2011-08-06 MED ORDER — MORPHINE SULFATE 4 MG/ML IJ SOLN
6.0000 mg | Freq: Once | INTRAMUSCULAR | Status: AC
  Administered 2011-08-06: 6 mg via INTRAVENOUS
  Filled 2011-08-06: qty 2

## 2011-08-06 MED ORDER — IOHEXOL 300 MG/ML  SOLN
20.0000 mL | INTRAMUSCULAR | Status: AC
  Administered 2011-08-06: 20 mL via ORAL

## 2011-08-06 MED ORDER — GI COCKTAIL ~~LOC~~
ORAL | Status: AC
  Filled 2011-08-06: qty 30

## 2011-08-06 MED ORDER — GI COCKTAIL ~~LOC~~
30.0000 mL | Freq: Once | ORAL | Status: AC
  Administered 2011-08-06: 30 mL via ORAL

## 2011-08-06 MED ORDER — SODIUM CHLORIDE 0.9 % IJ SOLN
INTRAMUSCULAR | Status: AC
  Filled 2011-08-06: qty 3

## 2011-08-06 MED ORDER — IOHEXOL 300 MG/ML  SOLN
100.0000 mL | Freq: Once | INTRAMUSCULAR | Status: AC | PRN
  Administered 2011-08-06: 100 mL via INTRAVENOUS

## 2011-08-06 MED ORDER — ONDANSETRON HCL 4 MG/2ML IJ SOLN
4.0000 mg | Freq: Once | INTRAMUSCULAR | Status: AC
  Administered 2011-08-06: 4 mg via INTRAVENOUS
  Filled 2011-08-06: qty 2

## 2011-08-06 MED ORDER — SODIUM CHLORIDE 0.9 % IV BOLUS (SEPSIS)
1000.0000 mL | Freq: Once | INTRAVENOUS | Status: AC
  Administered 2011-08-06: 1000 mL via INTRAVENOUS

## 2011-08-06 NOTE — Discharge Instructions (Signed)
Abdominal Pain Abdominal pain can be caused by many things. Your caregiver decides the seriousness of your pain by an examination and possibly blood tests and X-rays. Many cases can be observed and treated at home. Most abdominal pain is not caused by a disease and will probably improve without treatment. However, in many cases, more time must pass before a clear cause of the pain can be found. Before that point, it may not be known if you need more testing, or if hospitalization or surgery is needed. HOME CARE INSTRUCTIONS   Do not take laxatives unless directed by your caregiver.   Take pain medicine only as directed by your caregiver.   Only take over-the-counter or prescription medicines for pain, discomfort, or fever as directed by your caregiver.   Try a clear liquid diet (broth, tea, or water) for as long as directed by your caregiver. Slowly move to a bland diet as tolerated.  SEEK IMMEDIATE MEDICAL CARE IF:   The pain does not go away.   You have a fever.   You keep throwing up (vomiting).   The pain is felt only in portions of the abdomen. Pain in the right side could possibly be appendicitis. In an adult, pain in the left lower portion of the abdomen could be colitis or diverticulitis.   You pass bloody or black tarry stools.  MAKE SURE YOU:   Understand these instructions.   Will watch your condition.   Will get help right away if you are not doing well or get worse.  Document Released: 12/20/2004 Document Revised: 03/01/2011 Document Reviewed: 10/29/2007 [facility] Patient Information 2012 [place], [place].Cholelithiasis Cholelithiasis (also called gallstones) is a form of gallbladder disease where gallstones form in your gallbladder. The gallbladder is a non-essential organ that stores bile made in the liver, which helps digest fats. Gallstones begin as small crystals and slowly grow into stones. Gallstone pain occurs when the gallbladder spasms, and a gallstone is blocking  the duct. Pain can also occur when a stone passes out of the duct.  Women are more likely to develop gallstones than men. Other factors that increase the risk of gallbladder disease are:  Having multiple pregnancies. Physicians sometimes advise removing diseased gallbladders before future pregnancies.   Obesity.   Diets heavy in fried foods and fat.   Increasing age (older than 67).   Prolonged use of medications containing female hormones.   Diabetes mellitus.   Rapid weight loss.   Family history of gallstones (heredity).  SYMPTOMS  Feeling sick to your stomach (nauseous).   Abdominal pain.   Yellowing of the skin (jaundice).   Sudden pain. It may persist from several minutes to several hours.   Worsening pain with deep breathing or when jarred.   Fever.   Tenderness to the touch.  In some cases, when gallstones do not move into the bile duct, people have no pain or symptoms. These are called "silent" gallstones. TREATMENT In severe cases, emergency surgery may be required. HOME CARE INSTRUCTIONS   Only take over-the-counter or prescription medicines for pain, discomfort, or fever as directed by your caregiver.   Follow a low-fat diet until seen again. Fat causes the gallbladder to contract, which can result in pain.   Follow up as instructed. Attacks are almost always recurrent and surgery is usually required for permanent treatment.  SEEK IMMEDIATE MEDICAL CARE IF:   Your pain increases and is not controlled by medications.   You have an oral temperature above 102 F (38.9  C), not controlled by medication.   You develop nausea and vomiting.  MAKE SURE YOU:   Understand these instructions.   Will watch your condition.   Will get help right away if you are not doing well or get worse.  Document Released: 03/08/2005 Document Revised: 03/01/2011 Document Reviewed: 05/11/2010 [facility] Patient Information 2012 [place], [place].Biliary Colic  Biliary colic is a  steady or irregular pain in the upper abdomen. It is usually under the right side of the rib cage. It happens when gallstones interfere with the normal flow of bile from the gallbladder. Bile is a liquid that helps to digest fats. Bile is made in the liver and stored in the gallbladder. When you eat a meal, bile passes from the gallbladder through the cystic duct and the common bile duct into the small intestine. There, it mixes with partially digested food. If a gallstone blocks either of these ducts, the normal flow of bile is blocked. The muscle cells in the bile duct contract forcefully to try to move the stone. This causes the pain of biliary colic.  SYMPTOMS   A person with biliary colic usually complains of pain in the upper abdomen. This pain can be:   In the center of the upper abdomen just below the breastbone.   In the upper-right part of the abdomen, near the gallbladder and liver.   Spread back toward the right shoulder blade.   Nausea and vomiting.   The pain usually occurs after eating.   Biliary colic is usually triggered by the digestive system's demand for bile. The demand for bile is high after fatty meals. Symptoms can also occur when a person who has been fasting suddenly eats a very large meal. Most episodes of biliary colic pass after 1 to 5 hours. After the most intense pain passes, your abdomen may continue to ache mildly for about 24 hours.  DIAGNOSIS  After you describe your symptoms, your caregiver will perform a physical exam. He or she will pay attention to the upper right portion of your belly (abdomen). This is the area of your liver and gallbladder. An ultrasound will help your caregiver look for gallstones. Specialized scans of the gallbladder may also be done. Blood tests may be done, especially if you have fever or if your pain persists. PREVENTION  Biliary colic can be prevented by controlling the risk factors for gallstones. Some of these risk factors, such as  heredity, increasing age, and pregnancy are a normal part of life. Obesity and a high-fat diet are risk factors you can change through a healthy lifestyle. Women going through menopause who take hormone replacement therapy (estrogen) are also more likely to develop biliary colic. TREATMENT   Pain medication may be prescribed.   You may be encouraged to eat a fat-free diet.   If the first episode of biliary colic is severe, or episodes of colic keep retuning, surgery to remove the gallbladder (cholecystectomy) is usually recommended. This procedure can be done through small incisions using an instrument called a laparoscope. The procedure often requires a brief stay in the hospital. Some people can leave the hospital the same day. It is the most widely used treatment in people troubled by painful gallstones. It is effective and safe, with no complications in more than 90% of cases.   If surgery cannot be done, medication that dissolves gallstones may be used. This medication is expensive and can take months or years to work. Only small stones will dissolve.  Rarely, medication to dissolve gallstones is combined with a procedure called shock-wave lithotripsy. This procedure uses carefully aimed shock waves to break up gallstones. In many people treated with this procedure, gallstones form again within a few years.  PROGNOSIS  If gallstones block your cystic duct or common bile duct, you are at risk for repeated episodes of biliary colic. There is also a 25% chance that you will develop a gallbladder infection(acute cholecystitis), or some other complication of gallstones within 10 to 20 years. If you have surgery, schedule it at a time that is convenient for you and at a time when you are not sick. HOME CARE INSTRUCTIONS   Drink plenty of clear fluids.   Avoid fatty, greasy or fried foods, or any foods that make your pain worse.   Take medications as directed.  SEEK MEDICAL CARE IF:   You  develop a fever over 100.5 F (38.1 C).   Your pain gets worse over time.   You develop nausea that prevents you from eating and drinking.   You develop vomiting.  SEEK IMMEDIATE MEDICAL CARE IF:   You have continuous or severe belly (abdominal) pain which is not relieved with medications.   You develop nausea and vomiting which is not relieved with medications.   You have symptoms of biliary colic and you suddenly develop a fever and shaking chills. This may signal cholecystitis. Call your caregiver immediately.   You develop a yellow color to your skin or the white part of your eyes (jaundice).  Document Released: 08/13/2005 Document Revised: 03/01/2011 Document Reviewed: 10/23/2007 [facility] Patient Information 2012 [place], [place].

## 2011-08-06 NOTE — ED Notes (Signed)
Pt medicated as ordered. Will monitor

## 2011-08-06 NOTE — ED Notes (Signed)
Sent from [facility] for abd pain since saturday

## 2011-08-06 NOTE — Discharge Instructions (Signed)
We have determined that your problem requires further evaluation in the emergency department.  We will take care of your transport there.  Once at the emergency department, you will be evaluated by a provider and they will order whatever treatment or tests they deem necessary.  We cannot guarantee that they will do any specific test or do any specific treatment.    Cholelithiasis Cholelithiasis (also called gallstones) is a form of gallbladder disease where gallstones form in your gallbladder. The gallbladder is a non-essential organ that stores bile made in the liver, which helps digest fats. Gallstones begin as small crystals and slowly grow into stones. Gallstone pain occurs when the gallbladder spasms, and a gallstone is blocking the duct. Pain can also occur when a stone passes out of the duct.  Women are more likely to develop gallstones than men. Other factors that increase the risk of gallbladder disease are:  Having multiple pregnancies. Physicians sometimes advise removing diseased gallbladders before future pregnancies.   Obesity.   Diets heavy in fried foods and fat.   Increasing age (older than 80).   Prolonged use of medications containing female hormones.   Diabetes mellitus.   Rapid weight loss.   Family history of gallstones (heredity).  SYMPTOMS  Feeling sick to your stomach (nauseous).   Abdominal pain.   Yellowing of the skin (jaundice).   Sudden pain. It may persist from several minutes to several hours.   Worsening pain with deep breathing or when jarred.   Fever.   Tenderness to the touch.  In some cases, when gallstones do not move into the bile duct, people have no pain or symptoms. These are called "silent" gallstones. TREATMENT In severe cases, emergency surgery may be required. HOME CARE INSTRUCTIONS   Only take over-the-counter or prescription medicines for pain, discomfort, or fever as directed by your caregiver.   Follow a low-fat diet until  seen again. Fat causes the gallbladder to contract, which can result in pain.   Follow up as instructed. Attacks are almost always recurrent and surgery is usually required for permanent treatment.  SEEK IMMEDIATE MEDICAL CARE IF:   Your pain increases and is not controlled by medications.   You have an oral temperature above 102 F (38.9 C), not controlled by medication.   You develop nausea and vomiting.  MAKE SURE YOU:   Understand these instructions.   Will watch your condition.   Will get help right away if you are not doing well or get worse.  Document Released: 03/08/2005 Document Revised: 03/01/2011 Document Reviewed: 05/11/2010 [facility] Patient Information 2012 [place], [place].Cholelithiasis Cholelithiasis (also called gallstones) is a form of gallbladder disease where gallstones form in your gallbladder. The gallbladder is a non-essential organ that stores bile made in the liver, which helps digest fats. Gallstones begin as small crystals and slowly grow into stones. Gallstone pain occurs when the gallbladder spasms, and a gallstone is blocking the duct. Pain can also occur when a stone passes out of the duct.  Women are more likely to develop gallstones than men. Other factors that increase the risk of gallbladder disease are:  Having multiple pregnancies. Physicians sometimes advise removing diseased gallbladders before future pregnancies.   Obesity.   Diets heavy in fried foods and fat.   Increasing age (older than 79).   Prolonged use of medications containing female hormones.   Diabetes mellitus.   Rapid weight loss.   Family history of gallstones (heredity).  SYMPTOMS  Feeling sick to your stomach (nauseous).  Abdominal pain.   Yellowing of the skin (jaundice).   Sudden pain. It may persist from several minutes to several hours.   Worsening pain with deep breathing or when jarred.   Fever.   Tenderness to the touch.  In some cases, when gallstones  do not move into the bile duct, people have no pain or symptoms. These are called "silent" gallstones. TREATMENT In severe cases, emergency surgery may be required. HOME CARE INSTRUCTIONS   Only take over-the-counter or prescription medicines for pain, discomfort, or fever as directed by your caregiver.   Follow a low-fat diet until seen again. Fat causes the gallbladder to contract, which can result in pain.   Follow up as instructed. Attacks are almost always recurrent and surgery is usually required for permanent treatment.  SEEK IMMEDIATE MEDICAL CARE IF:   Your pain increases and is not controlled by medications.   You have an oral temperature above 102 F (38.9 C), not controlled by medication.   You develop nausea and vomiting.  MAKE SURE YOU:   Understand these instructions.   Will watch your condition.   Will get help right away if you are not doing well or get worse.  Document Released: 03/08/2005 Document Revised: 03/01/2011 Document Reviewed: 05/11/2010 [facility] Patient Information 2012 [place], [place].Cholelithiasis Cholelithiasis (also called gallstones) is a form of gallbladder disease where gallstones form in your gallbladder. The gallbladder is a non-essential organ that stores bile made in the liver, which helps digest fats. Gallstones begin as small crystals and slowly grow into stones. Gallstone pain occurs when the gallbladder spasms, and a gallstone is blocking the duct. Pain can also occur when a stone passes out of the duct.  Women are more likely to develop gallstones than men. Other factors that increase the risk of gallbladder disease are:  Having multiple pregnancies. Physicians sometimes advise removing diseased gallbladders before future pregnancies.   Obesity.   Diets heavy in fried foods and fat.   Increasing age (older than 78).   Prolonged use of medications containing female hormones.   Diabetes mellitus.   Rapid weight loss.   Family  history of gallstones (heredity).  SYMPTOMS  Feeling sick to your stomach (nauseous).   Abdominal pain.   Yellowing of the skin (jaundice).   Sudden pain. It may persist from several minutes to several hours.   Worsening pain with deep breathing or when jarred.   Fever.   Tenderness to the touch.  In some cases, when gallstones do not move into the bile duct, people have no pain or symptoms. These are called "silent" gallstones. TREATMENT In severe cases, emergency surgery may be required. HOME CARE INSTRUCTIONS   Only take over-the-counter or prescription medicines for pain, discomfort, or fever as directed by your caregiver.   Follow a low-fat diet until seen again. Fat causes the gallbladder to contract, which can result in pain.   Follow up as instructed. Attacks are almost always recurrent and surgery is usually required for permanent treatment.  SEEK IMMEDIATE MEDICAL CARE IF:   Your pain increases and is not controlled by medications.   You have an oral temperature above 102 F (38.9 C), not controlled by medication.   You develop nausea and vomiting.  MAKE SURE YOU:   Understand these instructions.   Will watch your condition.   Will get help right away if you are not doing well or get worse.  Document Released: 03/08/2005 Document Revised: 03/01/2011 Document Reviewed: 05/11/2010 [facility] Patient Information 2012 [place]  [place].Gallbladder Disease Gallbladder disease (cholecystitis) is an inflammation of your gallbladder. It is usually caused by a build-up of stones (gallstones) or sludge (cholelithiasis) in your gallbladder. The gallbladder is not an essential organ. It is located slightly to the right of center in the belly (abdomen), behind the liver. It stores bile made in the liver. Bile aids in digestion of fats. Gallbladder disease may result in nausea (feeling sick to your stomach), abdominal pain, and jaundice. In severe cases, emergency surgery may be  required. The most common type of gallbladder disease is gallstones. They begin as small crystals and slowly grow into stones. Gallstone pain occurs when the bile duct has spasms. The spasms are caused by the stone passing out of the duct. The stone is trying to pass at the same time bile is passing into the small bowel for digestion. The pain usually begins suddenly. It may persist from several minutes to several hours. Infection can occur. Infection can add to discomfort and severity of an acute attack. The pain may be made worse by breathing deeply or by being jarred. There may be fever and tenderness to the touch. In some cases, when gallstones do not move into the bile duct, people have no pain or symptoms. These are called "silent" gallstones. Women are three times more likely to develop gallstones than men. Women who have had several pregnancies are more likely to have gallbladder disease. Physicians sometimes advise removing diseased gallbladders before future pregnancies. Other factors that increase the risk of gallbladder disease are obesity, diets heavy in fried foods and dairy products, increasing age, prolonged use of medications containing female hormones, and heredity. HOME CARE INSTRUCTIONS   If your physician prescribed an antibiotic, take as directed.   Only take over-the-counter or prescription medicines for pain, discomfort, or fever as directed by your caregiver.   Follow a low fat diet until seen again. (Fat causes the gallbladder to contract.)   Follow-up as instructed. Attacks are almost always recurrent and surgery is usually required for permanent treatment.  SEEK IMMEDIATE MEDICAL CARE IF:   Pain is increasing and not controlled by medications.   The pain moves to another part of your abdomen or to your back. (Right sided pain can be appendicitis and left sided pain in adults can be diverticulitis).   You have a fever.   You develop nausea and vomiting.  Document  Released: 03/12/2005 Document Revised: 03/01/2011 Document Reviewed: 01/26/2011 [facility] Patient Information 2012 [place], [place].

## 2011-08-06 NOTE — ED Notes (Signed)
Patient transported to CT 

## 2011-08-06 NOTE — ED Provider Notes (Signed)
Chief Complaint  Patient presents with  . Abdominal Pain    History of Present Illness:   The patient is a 21 year old female who has had a three-day history of epigastric pain that began after eating a meal at a [employer]. She describes this as an ache, and rates it as a 9/10 in intensity. It radiates towards the pelvic area. Nothing makes it worse. She tried eating a light meal but didn't make her feel any worse or better. It's better if she lies on her right side. She tried a dose of laxative which did help a little bit. She also tried an antacid which didn't help at all. The patient has felt hot but denies any fever. She's not had much of an appetite. No constipation, diarrhea, or blood in the stool. She felt nauseated but has not vomited. She denies any dysuria, frequency, urgency, or hematuria. Her last menstrual period was April 16. She is sexually active without use of birth control. She denies any vaginal discharge, itching, pelvic pain, or irregular menses. The patient had an appendectomy in January of this year.  Review of Systems:  Other than noted above, the patient denies any of the following symptoms: Constitutional:  No fever, chills, fatigue, weight loss or anorexia. Lungs:  No cough or shortness of breath. Heart:  No chest pain, palpitations, syncope or edema.  No cardiac history. Abdomen:  No nausea, vomiting, hematememesis, melena, diarrhea, or hematochezia. GU:  No dysuria, frequency, urgency, or hematuria. Gyn:  No vaginal discharge, itching, abnormal bleeding or pelvic pain. Skin:  No rash or itching.  PMFSH:  Past medical history, family history, social history, meds, and allergies were reviewed.  No prior abdominal surgeries, past history of GI problems, STDs or GYN problems.  No history of aspirin or NSAID use.  No excessive alcohol intake.  Physical Exam:   Vital signs:  BP 115/72  Pulse 86  Temp(Src) 98.9 F (37.2 C) (Oral)  Resp 18  SpO2 97%  LMP  07/10/2011 Gen:  Alert, oriented, in no distress. Lungs:  Breath sounds clear and equal bilaterally.  No wheezes, rales or rhonchi. Heart:  Regular rhythm.  No gallops or murmers.   Abdomen:  Abdomen was soft, flat, nondistended. She has moderate pain to palpation in the epigastric area and in the right upper quadrant without guarding or rebound. No organomegaly or mass. Bowel sounds were not heard. Murphy sign and Murphy's punch were negative. Pelvic:  External genitalia were unremarkable. She has a moderate amount of yellowish, malodorous vaginal discharge. Cervix appears normal. No cervical motion tenderness. Uterus is mid position, normal in size and shape and nontender. No adnexal tenderness or mass. Skin:  Clear, warm and dry.  No rash.  Labs:   Results for orders placed during the hospital encounter of 08/06/11  WET PREP, GENITAL      Component Value Range   Yeast Wet Prep HPF POC NONE SEEN  NONE SEEN    Trich, Wet Prep NONE SEEN  NONE SEEN    Clue Cells Wet Prep HPF POC FEW (*) NONE SEEN    WBC, Wet Prep HPF POC NONE SEEN  NONE SEEN   POCT URINALYSIS DIP (DEVICE)      Component Value Range   Glucose, UA NEGATIVE  NEGATIVE (mg/dL)   Bilirubin Urine LARGE (*) NEGATIVE    Ketones, ur NEGATIVE  NEGATIVE (mg/dL)   Specific Gravity, Urine 1.020  1.005 - 1.030    Hgb urine dipstick NEGATIVE  NEGATIVE  pH 8.5 (*) 5.0 - 8.0    Protein, ur 30 (*) NEGATIVE (mg/dL)   Urobilinogen, UA 1.0  0.0 - 1.0 (mg/dL)   Nitrite NEGATIVE  NEGATIVE    Leukocytes, UA NEGATIVE  NEGATIVE   POCT PREGNANCY, URINE      Component Value Range   Preg Test, Ur NEGATIVE  NEGATIVE      Radiology:  KUB and upright abdomen reveals no evidence of perforation or obstruction.  Course in Urgent Care Center:   We attempted to obtain blood, but were unable to do so. The patient states that when she was last hospitalized she had to have a PICC line inserted in order for blood to be drawn. She was given a single dose  of GI cocktail which helped minimally.  Assessment:  The encounter diagnosis was Acute cholecystitis.  Plan:   1.  The following meds were prescribed:   New Prescriptions   No medications on file   2.  The patient was transferred to the emergency department via shuttle for further evaluation and treatment.  Reuben Likes, MD 08/06/11 1027

## 2011-08-06 NOTE — ED Provider Notes (Signed)
History  This chart was scribed for Felicia Razor, MD by Bennett Scrape. This patient was seen in room STRE6/STRE6 and the patient's care was started at 1:25PM.  CSN: [id number]  Arrival date & time 08/06/11  1032   First MD Initiated Contact with Patient 08/06/11 1325      Chief Complaint  Patient presents with  . Abdominal Pain    The history is provided by the patient. No language interpreter was used.    Felicia Salinas is a 21 y.o. female who presents to the Emergency Department complaining of 2 days of gradual onset, gradually worsening, constant epigastric abdominal pain described as dull, achy sensation with associated nausea. She was sent from  Urgent Care after being seen earlier today.  Pt states that the she originally thought the pain was from food poisoning but became concerned when the pain became worse today. She reports one episode of similar pain with appendicitis but states that the appendicitis episode was more severe. She denies vaginal bleeding, vaginal discharge, emesis and diarrhea as associated symptoms. She has a h/o asthma. She is an occasional alcohol user but denies smoking.   Past Medical History  Diagnosis Date  . Asthma   . Environmental allergies   . Acute appendicitis with localized peritonitis 04/08/2011    Past Surgical History  Procedure Date  . Tonsillectomy   . Appendectomy 04/07/2011  . Wisdom tooth extraction     Family History  Problem Relation Age of Onset  . Cancer Maternal Grandmother     breast    History  Substance Use Topics  . Smoking status: Never Smoker   . Smokeless tobacco: Not on file  . Alcohol Use: Yes     Occasional     Review of Systems  Constitutional: Negative for fever and chills.  Respiratory: Negative for cough and shortness of breath.   Gastrointestinal: Positive for nausea and abdominal pain. Negative for vomiting and diarrhea.  Genitourinary: Negative for vaginal bleeding and vaginal discharge.  All  other systems reviewed and are negative.    Allergies  Benadryl; Other; Eggs or egg-derived products; Iodine; and Tape  Home Medications   Current Outpatient Rx  Name Route Sig Dispense Refill  . ALBUTEROL SULFATE HFA 108 (90 BASE) MCG/ACT IN AERS Inhalation Inhale 2 puffs into the lungs every 4 (four) hours as needed. For shortness of breath    . BECLOMETHASONE DIPROPIONATE 40 MCG/ACT IN AERS Inhalation Inhale 2 puffs into the lungs 2 (two) times daily.    Marland Kitchen FEXOFENADINE HCL 30 MG PO TABS Oral Take 30 mg by mouth daily.    Marland Kitchen LORATADINE 10 MG PO TABS Oral Take 10 mg by mouth daily.      Triage Vitals: BP 114/69  Pulse 73  Temp(Src) 97.5 F (36.4 C) (Oral)  Resp 18  SpO2 100%  LMP 07/10/2011  Physical Exam  Nursing note and vitals reviewed. Constitutional: She is oriented to person, place, and time. She appears well-developed and well-nourished. No distress.  HENT:  Head: Normocephalic and atraumatic.  Eyes: EOM are normal.  Neck: Neck supple. No tracheal deviation present.  Cardiovascular: Normal rate.   Pulmonary/Chest: Effort normal. No respiratory distress.  Abdominal: There is tenderness. There is no rebound and no guarding.       Mild tenderness to the periumbilical region  Musculoskeletal: Normal range of motion.  Neurological: She is alert and oriented to person, place, and time.  Skin: Skin is warm and dry.  Psychiatric: She has  a normal mood and affect. Her behavior is normal.    ED Course  Procedures (including critical care time)  DIAGNOSTIC STUDIES: Oxygen Saturation is 100% on room air, normal by my interpretation.    COORDINATION OF CARE: 1:52PM-Discussed bacterial vaginosis findings from Urgent Care lab work upand antibiotics as treatment plan with pt and pt agreed to plan. 4:11PM-Pt rechecked and states that she feels better. Discussed blood work, CT scan and possible [place] with pt and pt agreed. 7:42PM-Discussed lab work and radiology reports with pt  and pt acknowledged reports. Advised pt to follow up with a GI specialist. Will send home with pain medication.  Labs Reviewed  COMPREHENSIVE METABOLIC PANEL - Abnormal; Notable for the following:    Sodium 134 (*)    AST 1138 (*)    ALT 1178 (*)    Alkaline Phosphatase 198 (*)    Total Bilirubin 2.1 (*)    All other components within normal limits  LIPASE, BLOOD  CBC  PROTIME-INR  HEPATITIS PANEL, ACUTE   US Abdomen Complete  08/06/2011  *RADIOLOGY REPORT*  Clinical Data:  Abdominal pain and nausea.  Appendectomy 4 months ago.  History of gallstones  ABDOMINAL ULTRASOUND COMPLETE  Comparison:  CT same date  Findings:  Gallbladder:  Multiple dependent gallstones are noted without gallbladder Salinas thickening, sonographic Murphy's sign, or pericholecystic fluid.  Largest measures 1.7 cm at the level of the gallbladder neck and is not mobile.  Common Bile Duct:  5 mm.  Within normal limits without visualized filling defect.  Liver: No focal mass lesion identified.  Within normal limits in parenchymal echogenicity.  IVC:  Appears normal.  Pancreas:  Obscured by bowel gas.  Spleen:  Within normal limits in size and echotexture.  Right kidney:  Normal in size and parenchymal echogenicity.  No evidence of mass or hydronephrosis.  Left kidney:  Normal in size and parenchymal echogenicity.  No evidence of mass or hydronephrosis.  Abdominal Aorta:  No aneurysm identified.  Bifurcation itself is not visualized.  IMPRESSION: Gallstones without other sonographic evidence for acute cholecystitis.  Largest 1.7 cm gallstone at the level of the neck is not visualized to be mobile, but there is no other specific evidence for cholecystitis.  Original Report Authenticated By: Harrel Lemon, M.D.   Ct Abdomen Pelvis W Contrast  08/06/2011  *RADIOLOGY REPORT*  Clinical Data: Increasing epigastric pain for 3 days. Appendectomy 4 months ago.  CT ABDOMEN AND PELVIS WITH CONTRAST  Technique:  Multidetector CT imaging  of the abdomen and pelvis was performed following the standard protocol during bolus administration of intravenous contrast.  Contrast: OMNIPAQUE IOHEXOL 300 MG/ML  SOLN  Comparison: Abdominal pelvic CT 04/07/2011.  Findings: The lung bases are clear.  There is no pleural effusion.  Multiple calcified gallstones are again noted.  There is no gallbladder Salinas thickening or biliary dilatation.  The liver, spleen and pancreas appear normal.  The adrenal glands and kidneys appear normal.  There are postsurgical changes status post interval appendectomy. Postsurgical changes are present within the anterior abdominal Salinas at the umbilicus.  No recurrent inflammatory change or focal fluid collection is seen.  The stomach is distended with enteric contrast.  A small amount of contrast has drained into the small bowel which is not distended. No colonic abnormalities are seen.  The uterus, ovaries and urinary bladder appear normal.  No osseous abnormalities are seen.  IMPRESSION:  1.  Postsurgical changes status post interval appendectomy.  No recurrent inflammatory process  or fluid collection demonstrated. 2.  Gastric distension without specific evidence of gastric outlet obstruction.  There is no evidence of small bowel obstruction. 3.  Cholelithiasis without evidence of cholecystitis.  Original Report Authenticated By: Gerrianne Scale, M.D.   Dg Abd Acute W/chest  08/06/2011  *RADIOLOGY REPORT*  Clinical Data: Epigastric pain, nausea.  ACUTE ABDOMEN SERIES (ABDOMEN 2 VIEW & CHEST 1 VIEW)  Comparison: 04/13/2011.  Findings: The bowel gas pattern is normal.  There is no evidence of free intraperitoneal air.  No suspicious radio-opaque calculi or other significant radiographic abnormality is seen. Heart size and mediastinal contours are within normal limits.  Both lungs are clear.  IMPRESSION: No acute findings.  Original Report Authenticated By: Cyndie Chime, M.D.     1. Cholelithiasis   2. Abdominal pain     3. Elevated LFTs   4. Total bilirubin, elevated       MDM  21 year old female with epigastric pain. Suspect that this is secondary to biliary colic and/or hepatitis. Patient's labs significant for fairly marked elevation of her LFTs. Ultrasound with large gallstone but no evidence of cholecystitis, choledocholithiasis or other particularly concerning pathology. Patient is afebrile and well appearing. She has only mild tenderness on exam. She denies any history of significant etoh, drug abuse or high risk sexual behaviors. No significant past medical history aside from asthma. Hepatitis panel was sent. Do not feel that patient is in need of inpatient evaluation at this time. Case was discussed briefly with gastroenterology to facilitate followup. Feel that outpatient followup is reasonable. Patient is known to Dr. Dwain Sarna, Gen. surgery because of her recent appendicitis. Surgical referral was also provided for possible cholecystectomy after GI issues were further evaluated  I personally preformed the services scribed in my presence. The recorded information has been reviewed and considered. Felicia Razor, MD.        Felicia Razor, MD 08/06/11 [phone]

## 2011-08-06 NOTE — ED Notes (Signed)
PT HERE WITH ABDOMINAL CONSTANT ACHY PAIN THAT WORSENS WITH LYING DOWN SINCE Sunday AFTER EATING AT [employer].C/O NAUSEA BUT NO VOMITING,FEVER OR DIARRHEA.LAST BM THIS MORNING.PT TOOK LAXATIVE BUT STATES THE PAIN PERSISTS.

## 2011-08-06 NOTE — ED Notes (Signed)
Went to obtain blood specimens and patient stated that she has veins that blow and the only way to get blood is through a pic-line. Notified nurse. Did not stick patient.

## 2011-08-06 NOTE — ED Notes (Signed)
Patient states developed general abdominal pain more in the middle of her abdomen. Pain 10/10 achy cramping sharp. Pain constant continued today went to urgent care sent to ED for evaluation. Airway intact bilateral equal chest rise and fall. Abdomen soft slight distended bowel sounds present in all fields.  Stated took a laxative over the weekend with some relief.

## 2011-08-06 NOTE — ED Notes (Signed)
The pt and family is upset that the pt has waited so long.  She started out at  [facility] and  It has been a long day for  Them.

## 2011-08-07 ENCOUNTER — Telehealth: Payer: Self-pay

## 2011-08-07 ENCOUNTER — Encounter: Payer: Self-pay | Admitting: Internal Medicine

## 2011-08-07 LAB — HEPATITIS PANEL, ACUTE
HCV Ab: NEGATIVE
Hep A IgM: NEGATIVE
Hep B C IgM: NEGATIVE
Hepatitis B Surface Ag: NEGATIVE

## 2011-08-07 LAB — GC/CHLAMYDIA PROBE AMP, GENITAL: Chlamydia, DNA Probe: NEGATIVE

## 2011-08-07 NOTE — Telephone Encounter (Signed)
Patient feeling a little better today.  She was offered an appt to see Mike Gip PA on Thursday, but said she will wait until next week to see Dr Leone Payor.  I have rescheduled her appt for 08/13/11 3:15

## 2011-08-07 NOTE — Telephone Encounter (Signed)
Message copied by Annett Fabian on Tue Aug 07, 2011 10:26 AM ------      Message from: Iva Boop      Created: Tue Aug 07, 2011  9:43 AM      Regarding: FW: ER follow-up Felicia Salinas       I told ED we would arrange follow-up for this patient - sounded like hepatitis            Suspect needs visit towards end of week or early next week - you can sort out when you call her            ----- Message -----         From: [name], MD         Sent: 08/06/2011   8:57 PM           To: Iva Boop, MD      Subject: ER follow-up Felicia Salinas

## 2011-08-13 ENCOUNTER — Encounter: Payer: Self-pay | Admitting: Internal Medicine

## 2011-08-13 ENCOUNTER — Ambulatory Visit (INDEPENDENT_AMBULATORY_CARE_PROVIDER_SITE_OTHER): Payer: BC Managed Care – PPO | Admitting: Internal Medicine

## 2011-08-13 ENCOUNTER — Other Ambulatory Visit (INDEPENDENT_AMBULATORY_CARE_PROVIDER_SITE_OTHER): Payer: BC Managed Care – PPO

## 2011-08-13 VITALS — BP 90/60 | HR 76 | Ht 65.0 in | Wt 248.0 lb

## 2011-08-13 DIAGNOSIS — R7401 Elevation of levels of liver transaminase levels: Secondary | ICD-10-CM

## 2011-08-13 DIAGNOSIS — K8020 Calculus of gallbladder without cholecystitis without obstruction: Secondary | ICD-10-CM

## 2011-08-13 LAB — HEPATIC FUNCTION PANEL
AST: 230 U/L — ABNORMAL HIGH (ref 0–37)
Albumin: 3.8 g/dL (ref 3.5–5.2)
Alkaline Phosphatase: 154 U/L — ABNORMAL HIGH (ref 39–117)
Bilirubin, Direct: 0.2 mg/dL (ref 0.0–0.3)
Total Bilirubin: 0.5 mg/dL (ref 0.3–1.2)
Total Protein: 7 g/dL (ref 6.0–8.3)

## 2011-08-13 MED ORDER — TRAMADOL HCL 50 MG PO TABS: 50.0000 mg | ORAL_TABLET | Freq: Four times a day (QID) | ORAL | Status: AC | PRN

## 2011-08-13 NOTE — Progress Notes (Signed)
Subjective:    Patient ID: Felicia Salinas, female    DOB: March 06, 1991, 21 y.o.   MRN: [id number]  HPI This pleasant 21 year old [place] American woman presents for evaluation after she was seen in the emergency room. He presented with epigastric pain, him nausea. She had marked elevation of her transaminases and mild elevation of alkaline phosphatase. Imaging studies a CT and ultrasound demonstrated cholelithiasis. An acute hepatitis panel was negative. Since then, she is better but is still had episodic epigastric and actually some left upper quadrant pain usually after eating. It will last for up to 2 hours and is fairly sharp and painful without radiation. Her oral narcotics may help but they don't always. She's had a few episodes since this problem last week. She says she really felt poorly for about 2 days prior to going to the emergency room.  Medications, allergies, past medical history, past surgical history, family history and social history are reviewed and updated in the EMR.   Review of Systems Positive for allergies all other review of systems are negative. Except as in the history of present illness.    Objective:   Physical Exam General:  Well-developed, well-nourished and in no acute distress - obese Eyes:  anicteric. Neck:   supple w/o thyromegaly or mass.  Lungs: Clear to auscultation bilaterally. Heart:  S1S2, no rubs, murmurs, gallops. Abdomen:  soft, non-tender, no hepatosplenomegaly, hernia, or mass and BS+. Midline small scar periumbilical Lymph:  no cervical or supraclavicular adenopathy. Extremities:   no edema Skin   no rash.tattoo right posterior shoulder Neuro:  A&O x 3.  Psych:  appropriate mood and  Affect. Musc:  No CVAT  Data Reviewed: Lab Results  Component Value Date   WBC 6.1 08/06/2011   HGB 13.6 08/06/2011   HCT 39.1 08/06/2011   MCV 89.1 08/06/2011   PLT 356 08/06/2011   Lab Results  Component Value Date   ALT 1178* 08/06/2011   AST 1138*  08/06/2011   ALKPHOS 198* 08/06/2011   BILITOT 2.1* 08/06/2011    Lab Results  Component Value Date   HEPAIGM NEGATIVE 08/06/2011   HEPBIGM NEGATIVE 08/06/2011    Ultrasound and CT abdomen and pelvis of 08/06/2011, ER visit note urgent care visit note  08/06/2011       Assessment & Plan:   1. Symptomatic cholelithiasis   2. Nonspecific elevation of levels of transaminase    I think she probably does have symptomatic cholelithiasis. She has an atypical lab pattern with transaminases over above 1000. On imaging she have a stone in the neck of the gallbladder that was not mobile and that certainly could have been causing her problems last week. She feels well in between which goes against hepatitis. Acute hepatitis panel was negative. She seems to be worse after fatty foods though history is not completely straightforward.  Will repeat her LFTs. She has a surgery appointment to see Dr. Dwain Sarna her general surgeon in about a week and she should keep that. She asked for a prescription for tramadol since the oxycodone was not helping and she had successful tramadol for is prescribed that for pain relief. She is advised to avoid fatty foods and she is also given instructions on gallbladder disease and if she has persistent pain that does not remit, with or without fever, with nausea vomiting then she should go back to the hospital.  I note that her stomach was dilated on the CT scan, I suspect that with spasm, the history is  not typically compatible with ulcer disease though she does have epigastric and some left-sided pain which is atypical for gallbladder, I still favor that is the problem.  I appreciate the opportunity to care for this patient.  CC: Harden Mo, MD

## 2011-08-13 NOTE — Patient Instructions (Addendum)
Your physician has requested that you go to the basement for the following lab work before leaving today: LFT's  You have been given Gallbladder disease information today to read and also a low fat diet handout.  We have sent the following medications to your pharmacy for you to pick up at your convenience: Tramadol

## 2011-08-14 ENCOUNTER — Other Ambulatory Visit: Payer: Self-pay

## 2011-08-14 DIAGNOSIS — R7989 Other specified abnormal findings of blood chemistry: Secondary | ICD-10-CM

## 2011-08-14 NOTE — Progress Notes (Signed)
Pt contacted and informed that her LFT's numbers were better and Dr. Leone Payor wants this test repeated 08/24/11.  I will put the order in the computer.

## 2011-08-14 NOTE — Progress Notes (Signed)
Quick Note:  LFT's much better Let's have her repeat them on May 31 Will see what Dr. Dwain Sarna says about her gallbladder (seeing him June 3) ______

## 2011-08-24 ENCOUNTER — Other Ambulatory Visit (INDEPENDENT_AMBULATORY_CARE_PROVIDER_SITE_OTHER): Payer: BC Managed Care – PPO

## 2011-08-24 DIAGNOSIS — R7989 Other specified abnormal findings of blood chemistry: Secondary | ICD-10-CM

## 2011-08-24 LAB — HEPATIC FUNCTION PANEL
ALT: 29 U/L (ref 0–35)
AST: 18 U/L (ref 0–37)
Albumin: 3.9 g/dL (ref 3.5–5.2)
Alkaline Phosphatase: 75 U/L (ref 39–117)
Bilirubin, Direct: 0.1 mg/dL (ref 0.0–0.3)
Total Bilirubin: 0.5 mg/dL (ref 0.3–1.2)
Total Protein: 7.1 g/dL (ref 6.0–8.3)

## 2011-08-24 NOTE — Progress Notes (Signed)
Quick Note:  Good news - the liver chemistries are back to normal  Keep 6/25 appointment with Dr. Dwain Sarna Avoid fatty foods Call back prn problems ______

## 2011-08-27 ENCOUNTER — Telehealth: Payer: Self-pay | Admitting: Internal Medicine

## 2011-08-27 ENCOUNTER — Encounter (INDEPENDENT_AMBULATORY_CARE_PROVIDER_SITE_OTHER): Payer: BC Managed Care – PPO | Admitting: General Surgery

## 2011-08-27 NOTE — Telephone Encounter (Signed)
I have left a detailed message for the patient with the results and Dr. Marvell Fuller recommendations on the labs from Friday.  I have asked her to call back for any questions

## 2011-08-28 ENCOUNTER — Telehealth: Payer: Self-pay | Admitting: Internal Medicine

## 2011-08-28 ENCOUNTER — Ambulatory Visit: Payer: BC Managed Care – PPO | Admitting: Internal Medicine

## 2011-08-28 NOTE — Telephone Encounter (Signed)
Patient advised that her gallbladder problems should not cause her feet to swell and she should be evaluated by her primary care MD for this issue.  Patient verbalized understanding.

## 2011-08-28 NOTE — Telephone Encounter (Signed)
Left message for patient to call back  

## 2011-08-29 ENCOUNTER — Emergency Department (HOSPITAL_COMMUNITY)
Admission: EM | Admit: 2011-08-29 | Discharge: 2011-08-29 | Disposition: A | Discharge status: Home / Self Care | Payer: BC Managed Care – PPO | Attending: Emergency Medicine | Admitting: Emergency Medicine

## 2011-08-29 ENCOUNTER — Encounter (HOSPITAL_COMMUNITY): Payer: Self-pay | Admitting: *Deleted

## 2011-08-29 DIAGNOSIS — Z91012 Allergy to eggs: Secondary | ICD-10-CM | POA: Insufficient documentation

## 2011-08-29 DIAGNOSIS — J45909 Unspecified asthma, uncomplicated: Secondary | ICD-10-CM | POA: Insufficient documentation

## 2011-08-29 DIAGNOSIS — M7989 Other specified soft tissue disorders: Secondary | ICD-10-CM | POA: Insufficient documentation

## 2011-08-29 DIAGNOSIS — E669 Obesity, unspecified: Secondary | ICD-10-CM | POA: Insufficient documentation

## 2011-08-29 LAB — BASIC METABOLIC PANEL
BUN: 11 mg/dL (ref 6–23)
CO2: 26 mEq/L (ref 19–32)
Calcium: 9.9 mg/dL (ref 8.4–10.5)
Chloride: 103 mEq/L (ref 96–112)
Creatinine, Ser: 0.77 mg/dL (ref 0.50–1.10)
GFR calc Af Amer: >90 mL/min (ref >90)
GFR calc non Af Amer: >90 mL/min (ref >90)
Glucose, Bld: 88 mg/dL (ref 70–99)
Potassium: 4.1 mEq/L (ref 3.5–5.1)
Sodium: 138 mEq/L (ref 135–145)

## 2011-08-29 LAB — DIFFERENTIAL
Basophils Absolute: 0 10*3/uL (ref 0.0–0.1)
Basophils Relative: 0 % (ref 0–1)
Eosinophils Absolute: 0.1 10*3/uL (ref 0.0–0.7)
Eosinophils Relative: 2 % (ref 0–5)
Lymphocytes Relative: 38 % (ref 12–46)
Lymphs Abs: 2 10*3/uL (ref 0.7–4.0)
Monocytes Absolute: 0.5 10*3/uL (ref 0.1–1.0)
Monocytes Relative: 9 % (ref 3–12)
Neutro Abs: 2.6 10*3/uL (ref 1.7–7.7)
Neutrophils Relative %: 50 % (ref 43–77)

## 2011-08-29 LAB — URINALYSIS, ROUTINE W REFLEX MICROSCOPIC
Bilirubin Urine: NEGATIVE
Glucose, UA: NEGATIVE mg/dL
Hgb urine dipstick: NEGATIVE
Ketones, ur: NEGATIVE mg/dL
Leukocytes, UA: NEGATIVE
Nitrite: NEGATIVE
Protein, ur: NEGATIVE mg/dL
Specific Gravity, Urine: 1.027 (ref 1.005–1.030)
Urobilinogen, UA: 0.2 mg/dL (ref 0.0–1.0)
pH: 6 (ref 5.0–8.0)

## 2011-08-29 LAB — CBC
HCT: 38.2 % (ref 36.0–46.0)
Hemoglobin: 13 g/dL (ref 12.0–15.0)
MCH: 30.9 pg (ref 26.0–34.0)
MCHC: 34 g/dL (ref 30.0–36.0)
MCV: 90.7 fL (ref 78.0–100.0)
Platelets: 315 10*3/uL (ref 150–400)
RBC: 4.21 MIL/uL (ref 3.87–5.11)
RDW: 12.8 % (ref 11.5–15.5)
WBC: 5.2 10*3/uL (ref 4.0–10.5)

## 2011-08-29 LAB — POCT PREGNANCY, URINE: Preg Test, Ur: NEGATIVE

## 2011-08-29 NOTE — ED Notes (Signed)
Reports bilateral lower leg swelling since Sunday. Denies any complaints associated with swelling.

## 2011-08-29 NOTE — Discharge Instructions (Signed)
Elevate your feet as much as possible. Cut down on salt intake. Return here as needed.       RESOURCE GUIDE  Chronic Pain Problems: Contact [facility]  [phone] Patients need to be referred by their primary care doctor.  Insufficient Money for Medicine: Contact United Way:  call "211" or [facility] [phone].  No Primary Care Doctor: - Call [facility]  [phone] - can help you locate a primary care doctor that  accepts your insurance, provides certain services, etc. - Physician Referral Service- [phone]  Agencies that provide inexpensive medical care: - [facility]  [phone] - [facility]  [phone] - [facility]  [phone] - Women's Clinic  [phone] - Planned Parenthood  [phone] [facility]  [phone]  Medicaid-accepting [facility] Providers: - [facility]- [address]  [phone], Mon-Fri 9am-7pm, Sat 9am-1pm - [facility]- [address] [place], [address] [place]  [phone] - [facility]- [address] [place]  [phone] [facility]- [address]  [phone] - Renaye Rakers- [address] [place], [address], [phone]  Only accepts [place] patients after they have their name  applied to their card  Self Pay (no insurance) in [place]: - Sickle Cell Patients: Dr Willey Blade, [facility] Internal Medicine  [address] [place], [phone] - [facility]- [address] [place]  [phone]       [facility]- [address]       -     [facility]- see information above (Speak to [employer] if you do not have insurance)       -  [facility]- [address] [place], [phone]       -  [facility]- [address],  [phone]       -  [facility]- [address], [phone]       -  Dr  Julio Sicks-  [address], [place], [phone]       -  [facility]gent Care- [address], [phone]       -  [facility]- [address], [phone], also [address] [phone]       -    [facility]- [address] [place], [phone], 1st & 3rd Saturday   every month, 10am-1pm  1) Find a Doctor and Pay Out of Pocket Although you won't have to find out who is covered by your insurance plan, it is a good idea to ask around and get recommendations. You will then need to call the office and see if the doctor you have chosen will accept you as a new patient and what types of options they offer for patients who are self-pay. Some doctors offer discounts or will set up payment plans for their patients who do not have insurance, but you will need to ask so you aren't surprised when you get to your appointment.  2) Contact Your Local Health Department Not all health departments have doctors that can see patients for sick visits, but many do, so it is worth a call to see if yours does. If you don't know where your local health department is, you can check in your  phone book. The CDC also has a tool to help you locate your state's health department, and many state websites also have listings of all of their local health departments.  3) Find a Walk-in Clinic If your illness is not likely to be very severe or complicated, you may want to try a walk in clinic. These are popping up all over the country in pharmacies, drugstores, and shopping centers. They're usually staffed by nurse practitioners or physician assistants that have been trained to treat common illnesses and complaints. They're usually fairly quick and inexpensive. However, if you have serious medical issues or chronic medical problems, these are probably not your best option  STD Testing - [facility] Department of [facility], STD Clinic, [address] [place], phone  [phone] or [phone].  Monday - Friday, call for an appointment. [facility] Department of [employer], STD Clinic, [address], [place], phone [phone] or [phone].  Monday - Friday, call for an appointment.  Abuse/Neglect: [facility] Child Abuse Hotline 815-740-6063 [facility] Child Abuse Hotline 878-220-9770 (After Hours)  Emergency Shelter:  [facility] (503)431-4705  Maternity Homes: - [facility] [place] [facility] (772) 428-9065 - [facility] [phone]  MRSA Hotline #:   (214) 748-9725  [facility] Resources  [facility] [place]  United Way [facility] Dept. [address]                 [address]         [address] [place] [address]  Blondell Reveal Phone:  [phone]                                  Phone:  [phone]                   Phone:  [phone]  [facility] Mental Health, [phone] - [facility] - [phone]       -     [facility] in [place], [address]                                  (719)[phone], [facility] Child Abuse Hotline 5718658823 or [phone] (After Hours)   Behavioral Health Services  Substance Abuse Resources: - Alcohol and Drug Services  564-220-6834 - [facility] [phone] - [facility] (380) 888-0721 Floydene Flock [phone] - Residential & Outpatient Substance Abuse Program  (949) [phone]  Psychological Services: [facility]  603-086-5275 Services  [phone] - [facility], ([phone] [place]. [address], [place], ACCESS LINE: [phone] or 910-496-4323, [employer]za  Dental Assistance  If unable to pay or uninsured, contact:  [facility] or [facility]. to become qualified for the adult dental clinic.  Patients with Medicaid: [facility] 716-764-3331 [address], [phone] [address] [phone]  If unable to pay, or uninsured, contact [facility] 7407452577) or [facility] Department [phone] in [place], [phone] in [facility]) to become qualified for the adult dental clinic  Other Low-Cost Community Dental Services: - [facility]- [address] [place], [place], [place], [address], [phone], [address]. [phone], 2nd and 4th Thursday of the month at 6:30am.  10 clients each day by appointment, can sometimes see walk-in patients if someone does not show for an appointment. [facility]- [address] [place] Griffins [place], [place], [address], [phone] - [facility]- [address], [place], [place], [address], [phone] - [facility]- [phone] [facility] Department- [phone] [facility] Department- [phone]

## 2011-08-29 NOTE — ED Provider Notes (Signed)
History     CSN: [id number]  Arrival date & time 08/29/11  2956   First MD Initiated Contact with Patient 08/29/11 [phone]      Chief Complaint  Patient presents with  . Leg Swelling    (Consider location/radiation/quality/duration/timing/severity/associated sxs/prior treatment) HPI Patient presents emergency department with swelling in both lower legs since Sunday.  Patient states she does have some mild pain occasionally in her ankles.  Patient states that she never had a pain like this before and when she elevates her legs it seems to help with the problem.  Patient denies chest pain, shortness of breath, difficulty urinating, nausea, vomiting, diarrhea, abdominal pain, or fever.  Patient, states that she has not had this issue in the past.  Past Medical History  Diagnosis Date  . Asthma   . Environmental allergies   . Acute appendicitis with localized peritonitis 04/08/2011    Past Surgical History  Procedure Date  . Tonsillectomy   . Appendectomy 04/07/2011  . Wisdom tooth extraction     Family History  Problem Relation Age of Onset  . Breast cancer Maternal Grandmother   . Ovarian cancer Paternal Aunt   . Ovarian cancer Maternal Aunt   . Diabetes Sister   . Diabetes Paternal Grandfather   . Diabetes Maternal Grandmother   . Hypertension Maternal Grandfather   . Colon cancer Neg Hx     History  Substance Use Topics  . Smoking status: Never Smoker   . Smokeless tobacco: Never Used  . Alcohol Use: Yes     Occasional    OB History    Grav Para Term Preterm Abortions TAB SAB Ect Mult Living                  Review of Systems All other systems negative except as documented in the HPI. All pertinent positives and negatives as reviewed in the HPI.  Allergies  Benadryl; Other; Eggs or egg-derived products; and Tape  Home Medications   Current Outpatient Rx  Name Route Sig Dispense Refill  . ALBUTEROL SULFATE HFA 108 (90 BASE) MCG/ACT IN AERS Inhalation Inhale  2 puffs into the lungs every 4 (four) hours as needed. For shortness of breath    . BECLOMETHASONE DIPROPIONATE 40 MCG/ACT IN AERS Inhalation Inhale 2 puffs into the lungs 2 (two) times daily.    Marland Kitchen FEXOFENADINE HCL 30 MG PO TABS Oral Take 30 mg by mouth daily as needed. For allergies    . IBUPROFEN 200 MG PO TABS Oral Take 800 mg by mouth every 6 (six) hours as needed. For pain    . LORATADINE 10 MG PO TABS Oral Take 10 mg by mouth daily as needed. For allergies    . ADULT MULTIVITAMIN W/MINERALS CH Oral Take 1 tablet by mouth daily.    Marland Kitchen RANITIDINE HCL 150 MG PO TABS Oral Take 1 tablet by mouth Once daily as needed. For reflux    . TRAMADOL HCL 50 MG PO TABS Oral Take 50 mg by mouth every 4 (four) hours as needed. For pain      BP 110/68  Pulse 71  Temp(Src) 98.3 F (36.8 C) (Oral)  Resp 17  SpO2 100%  LMP 08/06/2011  Physical Exam  Constitutional: She appears well-nourished.  HENT:  Head: Normocephalic and atraumatic.  Mouth/Throat: Oropharynx is clear and moist.  Cardiovascular: Normal rate, regular rhythm and normal heart sounds.   Pulmonary/Chest: Effort normal and breath sounds normal. No respiratory distress. She has no wheezes.  She has no rales.  Musculoskeletal:       The patient does not have any pitting edema of the lower extremities, ankles or feet.  There may be some mild welling noted bilaterally.  The patient is significantly obese, and it is hard to determine if this is true swelling.  Skin: Skin is warm and dry. No rash noted.    ED Course  Procedures (including critical care time)  Labs Reviewed  URINALYSIS, ROUTINE W REFLEX MICROSCOPIC - Abnormal; Notable for the following:    APPearance CLOUDY (*)    All other components within normal limits  CBC  DIFFERENTIAL  BASIC METABOLIC PANEL  POCT PREGNANCY, URINE     MDM          Carlyle Dolly, PA-C 08/29/11 1133

## 2011-08-29 NOTE — ED Notes (Signed)
Pt d/c'd from continuous pulse oximetry and blood pressure cuff; pt getting ready to be discharged home; family at bedside

## 2011-08-30 NOTE — ED Provider Notes (Signed)
Medical screening examination/treatment/procedure(s) were performed by non-physician practitioner and as supervising physician I was immediately available for consultation/collaboration.   Laray Anger, DO 08/30/11 2207

## 2011-09-10 ENCOUNTER — Telehealth: Payer: Self-pay | Admitting: Internal Medicine

## 2011-09-10 NOTE — Telephone Encounter (Signed)
Left message for patient to call back  

## 2011-09-10 NOTE — Telephone Encounter (Signed)
Patient reports small amount of bright red blood on the tissue when she wipes and occasional painful stools.  She reports that she has had some constipation and hard stools.  She is asked to try Colace 1-2 times a day and increase the amount of fluid and fiber in her diet.  She will call back for continued rectal bleeding or painful BM.

## 2011-09-17 ENCOUNTER — Other Ambulatory Visit: Payer: Self-pay | Admitting: Internal Medicine

## 2011-09-17 NOTE — Telephone Encounter (Signed)
Pt informed that we can not refill the hydrocodone, we didn't Rx it.  She is seeing surgeon tomorrow.  Hopefully they can advise her on this.  She has Tramadol she can take tonight.  When we spoke she said she is having rectal bleeding with wiping.  She is going to call us back tomorrow after her surgeon appt. And set up time with [place].

## 2011-09-18 ENCOUNTER — Encounter (INDEPENDENT_AMBULATORY_CARE_PROVIDER_SITE_OTHER): Payer: Self-pay | Admitting: General Surgery

## 2011-09-18 ENCOUNTER — Ambulatory Visit (INDEPENDENT_AMBULATORY_CARE_PROVIDER_SITE_OTHER): Payer: BC Managed Care – PPO | Admitting: General Surgery

## 2011-09-18 VITALS — BP 110/70 | HR 87 | Temp 96.9°F | Ht 65.0 in | Wt 250.0 lb

## 2011-09-18 DIAGNOSIS — K8020 Calculus of gallbladder without cholecystitis without obstruction: Secondary | ICD-10-CM

## 2011-09-18 NOTE — Progress Notes (Signed)
Patient ID: Felicia Salinas, female   DOB: [birth year], 21 y.o.   MRN: [id number]  Chief Complaint  Patient presents with  . Pre-op Exam    eval gallstones    HPI Felicia Salinas is a 21 y.o. female.   HPI This is a 21 year old female I know from an open appendectomy for gangrenous appendicitis. She had a fairly long postoperative course but is now doing just fine from that. In May she had some generalized upper abdominal pain that was made much worse by eating at a family wedding. This was associated with nausea. She also has some bloating and some constipation. She points to her epigastrium when asked to identify the site of pain. She has had no further episodes but she is significantly change her diet since then. She had elevated liver function tests at that time. A CT scan and an ultrasound show gallstones as well as one stone in her neck. There are no issues it appears with her prior appendectomy. She was evaluated by gastroenterology and then was also referred to see me for treatment of what appears to be symptomatic cholelithiasis.  Past Medical History  Diagnosis Date  . Asthma   . Environmental allergies   . Gallstones     Past Surgical History  Procedure Date  . Tonsillectomy   . Appendectomy 04/07/2011  . Wisdom tooth extraction     Family History  Problem Relation Age of Onset  . Breast cancer Maternal Grandmother   . Ovarian cancer Paternal Aunt   . Ovarian cancer Maternal Aunt   . Diabetes Sister   . Diabetes Paternal Grandfather   . Diabetes Maternal Grandmother   . Hypertension Maternal Grandfather   . Colon cancer Neg Hx     Social History History  Substance Use Topics  . Smoking status: Never Smoker   . Smokeless tobacco: Never Used  . Alcohol Use: Yes     Occasional    Allergies  Allergen Reactions  . Benadryl (Diphenhydramine Hcl) Hives  . Other Hives    Chicken, shrimp, dairy products, shellfish  . Eggs Or Egg-Derived Products Hives and Rash  .  Tape Rash    Current Outpatient Prescriptions  Medication Sig Dispense Refill  . albuterol (PROVENTIL HFA;VENTOLIN HFA) 108 (90 BASE) MCG/ACT inhaler Inhale 2 puffs into the lungs every 4 (four) hours as needed. For shortness of breath      . beclomethasone (QVAR) 40 MCG/ACT inhaler Inhale 2 puffs into the lungs 2 (two) times daily.      . fexofenadine (ALLEGRA) 30 MG tablet Take 30 mg by mouth daily as needed. For allergies      . ibuprofen (ADVIL,MOTRIN) 200 MG tablet Take 800 mg by mouth every 6 (six) hours as needed. For pain      . loratadine (CLARITIN) 10 MG tablet Take 10 mg by mouth daily as needed. For allergies      . Multiple Vitamin (MULITIVITAMIN WITH MINERALS) TABS Take 1 tablet by mouth daily.      . ranitidine (ZANTAC) 150 MG tablet Take 1 tablet by mouth Once daily as needed. For reflux      . traMADol (ULTRAM) 50 MG tablet Take 50 mg by mouth every 4 (four) hours as needed. For pain        Review of Systems Review of Systems  Constitutional: Negative for fever, chills and unexpected weight change.  HENT: Negative for hearing loss, congestion, sore throat, trouble swallowing and voice change.   Eyes:  Negative for visual disturbance.  Respiratory: Negative for cough and wheezing.   Cardiovascular: Negative for chest pain, palpitations and leg swelling.  Gastrointestinal: Positive for nausea and abdominal pain. Negative for vomiting, diarrhea, constipation, blood in stool, abdominal distention and anal bleeding.  Genitourinary: Negative for hematuria, vaginal bleeding and difficulty urinating.  Musculoskeletal: Negative for arthralgias.  Skin: Negative for rash and wound.  Neurological: Negative for seizures, syncope and headaches.  Hematological: Negative for adenopathy. Does not bruise/bleed easily.  Psychiatric/Behavioral: Negative for confusion.    Blood pressure 110/70, pulse 87, temperature 96.9 F (36.1 C), temperature source Temporal, height 5\' 5"  (1.651 m),  weight 250 lb (113.399 kg), SpO2 98.00%.  Physical Exam Physical Exam  Vitals reviewed. Constitutional: She appears well-developed and well-nourished.  Eyes: No scleral icterus.  Cardiovascular: Normal rate, regular rhythm and normal heart sounds.   Pulmonary/Chest: Effort normal and breath sounds normal. She has no wheezes. She has no rales.  Abdominal: Soft. Bowel sounds are normal. There is no tenderness. No hernia.      Data Reviewed ABDOMINAL ULTRASOUND COMPLETE  Comparison: CT same date  Findings:  Gallbladder: Multiple dependent gallstones are noted without  gallbladder Salinas thickening, sonographic Murphy's sign, or  pericholecystic fluid. Largest measures 1.7 cm at the level of the  gallbladder neck and is not mobile.  Common Bile Duct: 5 mm. Within normal limits without visualized  filling defect.  Liver: No focal mass lesion identified. Within normal limits in  parenchymal echogenicity.  IVC: Appears normal.  Pancreas: Obscured by bowel gas.  Spleen: Within normal limits in size and echotexture.  Right kidney: Normal in size and parenchymal echogenicity. No  evidence of mass or hydronephrosis.  Left kidney: Normal in size and parenchymal echogenicity. No  evidence of mass or hydronephrosis.  Abdominal Aorta: No aneurysm identified. Bifurcation itself is  not visualized.  IMPRESSION:  Gallstones without other sonographic evidence for acute  cholecystitis. Largest 1.7 cm gallstone at the level of the neck  is not visualized to be mobile, but there is no other specific  evidence for cholecystitis.    Assessment    Symptomatic cholelithiasis    Plan    I agree with Dr. Leone Payor and do think she likely has sx cholelithiasis.  We discussed a lap chole for treatment of disease as well as to prevent further episodes or passage of stones. I discussed the procedure in detail.  The patient was given Agricultural engineer.  We discussed the risks and benefits of a  laparoscopic cholecystectomy and possible cholangiogram including, but not limited to bleeding, infection, injury to surrounding structures such as the intestine or liver, bile leak, retained gallstones, need to convert to an open procedure, prolonged diarrhea, blood clots such as  DVT, common bile duct injury, anesthesia risks, and possible need for additional procedures.  The likelihood of improvement in symptoms and return to the patient's normal status is good. We discussed the typical post-operative recovery course.       Vidyuth Belsito 09/18/2011, 9:37 AM

## 2011-09-18 NOTE — Patient Instructions (Signed)
[facility] LAPAROSCOPIC SURGERY: POST OP INSTRUCTIONS  Always review your discharge instruction sheet given to you by the facility where your surgery was performed. IF YOU HAVE DISABILITY OR FAMILY LEAVE FORMS, YOU MUST BRING THEM TO THE OFFICE FOR PROCESSING.   DO NOT GIVE THEM TO YOUR DOCTOR.  1. A prescription for pain medication may be given to you upon discharge.  Take your pain medication as prescribed, if needed.  If narcotic pain medicine is not needed, then you may take acetaminophen (Tylenol), naprosyn (Alleve), or ibuprofen (Advil) as needed. 2. Take your usually prescribed medications unless otherwise directed. 3. If you need a refill on your pain medication, please contact your pharmacy.  They will contact our office to request authorization. Prescriptions will not be filled after 5pm or on week-ends. 4. You should follow a light diet the first few days after arrival home, such as soup and crackers, etc.  Be sure to include lots of fluids daily. 5. Most patients will experience some swelling and bruising in the area of the incisions.  Ice packs will help.  Swelling and bruising can take several days to resolve.  6. It is common to experience some constipation if taking pain medication after surgery.  Increasing fluid intake and taking a stool softener (such as Colace) will usually help or prevent this problem from occurring.  A mild laxative (Milk of Magnesia or Miralax) should be taken according to package instructions if there are no bowel movements after 48 hours. 7. Unless discharge instructions indicate otherwise, you may remove your bandages 48 hours after surgery, and you may shower at that time.  You may have steri-strips (small skin tapes) in place directly over the incision.  These strips should be left on the skin for 7-10 days.  If your surgeon used skin glue on the incision, you may shower in 24 hours.  The glue will flake  off over the next 2-3 weeks.  Any sutures or staples will be removed at the office during your follow-up visit. 8. ACTIVITIES:  You may resume regular (light) daily activities beginning the next day--such as daily self-care, walking, climbing stairs--gradually increasing activities as tolerated.  You may have sexual intercourse when it is comfortable.  Refrain from any heavy lifting or straining until approved by your doctor. a. You may drive when you are no longer taking prescription pain medication, you can comfortably wear a seatbelt, and you can safely maneuver your car and apply brakes. b. RETURN TO WORK:  __________________________________________________________ 9. You should see your doctor in the office for a follow-up appointment approximately 2-3 weeks after your surgery.  Make sure that you call for this appointment within a day or two after you arrive home to insure a convenient appointment time. 10. OTHER INSTRUCTIONS: __________________________________________________________________________________________________________________________ __________________________________________________________________________________________________________________________ WHEN TO CALL YOUR DOCTOR: 1. Fever over 101.0 2. Inability to urinate 3. Continued bleeding from incision. 4. Increased pain, redness, or drainage from the incision. 5. Increasing abdominal pain  The clinic staff is available to answer your questions during regular business hours.  Please don't hesitate to call and ask to speak to one of the nurses for clinical concerns.  If you have a medical emergency, go to the nearest emergency room or call 911.  A surgeon from [facility] is always on call at the hospital. [address], [address], [place], [place]  [address] ? [address] [address], [place], [place]   [address] [phone] ? [phone] ? [phone] Web site: [email or web address]  

## 2011-10-03 ENCOUNTER — Encounter (HOSPITAL_COMMUNITY): Payer: Self-pay | Admitting: Respiratory Therapy

## 2011-10-11 NOTE — Pre-Procedure Instructions (Signed)
[address]  10/11/2011   Your procedure is scheduled on:  Fri, July 25 @ 9:00 AM  Report to [facility] at 7:00 AM.  Call this number if you have problems the morning of surgery: [phone]   Remember:   Do not eat food:After Midnight.  Take these medicines the morning of surgery with A SIP OF WATER: Albuterol<Bring Your Inhaler With You>,QVAR(Beclomethasone),Allegra(Fexofenadine),Loratadine(Claritin),and Tramadol(Ultram)   Do not wear jewelry, make-up or nail polish.  Do not wear lotions, powders, or perfumes.  Do not shave 48 hours prior to surgery.   Do not bring valuables to the hospital.  Contacts, dentures or bridgework may not be worn into surgery.  Leave suitcase in the car. After surgery it may be brought to your room.  For patients admitted to the hospital, checkout time is 11:00 AM the day of discharge.   Patients discharged the day of surgery will not be allowed to drive home.    Special Instructions: CHG Shower Use Special Wash: 1/2 bottle night before surgery and 1/2 bottle morning of surgery.   Please read over the following fact sheets that you were given: Pain Booklet, Coughing and Deep Breathing, MRSA Information and Surgical Site Infection Prevention

## 2011-10-12 ENCOUNTER — Encounter (HOSPITAL_COMMUNITY)
Admission: RE | Admit: 2011-10-12 | Discharge: 2011-10-12 | Disposition: A | Discharge status: Home / Self Care | Payer: BC Managed Care – PPO | Source: Ambulatory Visit | Attending: General Surgery | Admitting: General Surgery

## 2011-10-12 ENCOUNTER — Encounter (HOSPITAL_COMMUNITY): Payer: Self-pay

## 2011-10-12 LAB — COMPREHENSIVE METABOLIC PANEL
ALT: 12 U/L (ref 0–35)
CO2: 27 mEq/L (ref 19–32)
Calcium: 9.6 mg/dL (ref 8.4–10.5)
Chloride: 103 mEq/L (ref 96–112)
Creatinine, Ser: 0.75 mg/dL (ref 0.50–1.10)
GFR calc Af Amer: >90 mL/min (ref >90)
GFR calc non Af Amer: >90 mL/min (ref >90)
Glucose, Bld: 90 mg/dL (ref 70–99)
Sodium: 139 mEq/L (ref 135–145)
Total Bilirubin: 0.2 mg/dL — ABNORMAL LOW (ref 0.3–1.2)

## 2011-10-12 LAB — CBC
Hemoglobin: 13 g/dL (ref 12.0–15.0)
MCH: 31.5 pg (ref 26.0–34.0)
MCV: 90.6 fL (ref 78.0–100.0)
RBC: 4.13 MIL/uL (ref 3.87–5.11)
WBC: 5.2 10*3/uL (ref 4.0–10.5)

## 2011-10-12 NOTE — Progress Notes (Signed)
Spoke with Revonda Standard, [place] regarding history asthma. Patient reports being well controlled. Per Revonda Standard ok to defer CXR.

## 2011-10-17 MED ORDER — DEXTROSE 5 % IV SOLN
2.0000 g | INTRAVENOUS | Status: AC
  Administered 2011-10-18: 2 g via INTRAVENOUS
  Filled 2011-10-17: qty 2

## 2011-10-18 ENCOUNTER — Encounter (HOSPITAL_COMMUNITY): Payer: Self-pay | Admitting: Certified Registered Nurse Anesthetist

## 2011-10-18 ENCOUNTER — Encounter (HOSPITAL_COMMUNITY): Payer: Self-pay | Admitting: *Deleted

## 2011-10-18 ENCOUNTER — Ambulatory Visit (HOSPITAL_COMMUNITY): Payer: BC Managed Care – PPO | Admitting: Certified Registered Nurse Anesthetist

## 2011-10-18 ENCOUNTER — Encounter (HOSPITAL_COMMUNITY)
Admission: RE | Disposition: A | Discharge status: Home / Self Care | Payer: Self-pay | Source: Ambulatory Visit | Attending: General Surgery

## 2011-10-18 ENCOUNTER — Ambulatory Visit (HOSPITAL_COMMUNITY)
Admission: RE | Admit: 2011-10-18 | Discharge: 2011-10-18 | Disposition: A | Discharge status: Home / Self Care | Payer: BC Managed Care – PPO | Source: Ambulatory Visit | Attending: General Surgery | Admitting: General Surgery

## 2011-10-18 DIAGNOSIS — K8010 Calculus of gallbladder with chronic cholecystitis without obstruction: Secondary | ICD-10-CM

## 2011-10-18 DIAGNOSIS — Z01812 Encounter for preprocedural laboratory examination: Secondary | ICD-10-CM | POA: Insufficient documentation

## 2011-10-18 DIAGNOSIS — J45909 Unspecified asthma, uncomplicated: Secondary | ICD-10-CM | POA: Insufficient documentation

## 2011-10-18 HISTORY — PX: CHOLECYSTECTOMY: SHX55

## 2011-10-18 SURGERY — LAPAROSCOPIC CHOLECYSTECTOMY WITH INTRAOPERATIVE CHOLANGIOGRAM
Anesthesia: General | Wound class: Clean Contaminated

## 2011-10-18 MED ORDER — ALBUTEROL SULFATE HFA 108 (90 BASE) MCG/ACT IN AERS: 2.0000 | INHALATION_SPRAY | RESPIRATORY_TRACT | Status: DC | PRN

## 2011-10-18 MED ORDER — SODIUM CHLORIDE 0.9 % IV SOLN: 250.0000 mL | INTRAVENOUS | Status: DC | PRN

## 2011-10-18 MED ORDER — LACTATED RINGERS IV SOLN
INTRAVENOUS | Status: DC | PRN
  Administered 2011-10-18: 10:00:00 via INTRAVENOUS

## 2011-10-18 MED ORDER — SODIUM CHLORIDE 0.9 % IV SOLN: INTRAVENOUS | Status: DC

## 2011-10-18 MED ORDER — VECURONIUM BROMIDE 10 MG IV SOLR
INTRAVENOUS | Status: DC | PRN
  Administered 2011-10-18: 2 mg via INTRAVENOUS

## 2011-10-18 MED ORDER — MIDAZOLAM HCL 5 MG/5ML IJ SOLN
INTRAMUSCULAR | Status: DC | PRN
  Administered 2011-10-18 (×2): 1 mg via INTRAVENOUS

## 2011-10-18 MED ORDER — SODIUM CHLORIDE 0.9 % IJ SOLN: 3.0000 mL | INTRAMUSCULAR | Status: DC | PRN

## 2011-10-18 MED ORDER — ACETAMINOPHEN 325 MG PO TABS: 650.0000 mg | ORAL_TABLET | ORAL | Status: DC | PRN

## 2011-10-18 MED ORDER — PROPOFOL 10 MG/ML IV BOLUS
INTRAVENOUS | Status: DC | PRN
  Administered 2011-10-18: 120 mg via INTRAVENOUS

## 2011-10-18 MED ORDER — HYDROMORPHONE HCL PF 1 MG/ML IJ SOLN: 0.2500 mg | INTRAMUSCULAR | Status: DC | PRN

## 2011-10-18 MED ORDER — ALBUTEROL SULFATE HFA 108 (90 BASE) MCG/ACT IN AERS
INHALATION_SPRAY | RESPIRATORY_TRACT | Status: DC | PRN
  Administered 2011-10-18 (×2): 2 via RESPIRATORY_TRACT

## 2011-10-18 MED ORDER — ONDANSETRON HCL 4 MG/2ML IJ SOLN: 4.0000 mg | Freq: Four times a day (QID) | INTRAMUSCULAR | Status: DC | PRN

## 2011-10-18 MED ORDER — MORPHINE SULFATE 2 MG/ML IJ SOLN: 2.0000 mg | INTRAMUSCULAR | Status: DC | PRN

## 2011-10-18 MED ORDER — GLYCOPYRROLATE 0.2 MG/ML IJ SOLN
INTRAMUSCULAR | Status: DC | PRN
  Administered 2011-10-18: .6 mg via INTRAVENOUS
  Administered 2011-10-18: 0.2 mg via INTRAVENOUS

## 2011-10-18 MED ORDER — ROCURONIUM BROMIDE 100 MG/10ML IV SOLN
INTRAVENOUS | Status: DC | PRN
  Administered 2011-10-18: 50 mg via INTRAVENOUS

## 2011-10-18 MED ORDER — SODIUM CHLORIDE 0.9 % IV SOLN
INTRAVENOUS | Status: DC | PRN
  Administered 2011-10-18: 11:00:00

## 2011-10-18 MED ORDER — SODIUM CHLORIDE 0.9 % IR SOLN
Status: DC | PRN
  Administered 2011-10-18: 1000 mL

## 2011-10-18 MED ORDER — LIDOCAINE HCL (CARDIAC) 20 MG/ML IV SOLN
INTRAVENOUS | Status: DC | PRN
  Administered 2011-10-18: 100 mg via INTRAVENOUS

## 2011-10-18 MED ORDER — HEMOSTATIC AGENTS (NO CHARGE) OPTIME
TOPICAL | Status: DC | PRN
  Administered 2011-10-18: 1

## 2011-10-18 MED ORDER — SODIUM CHLORIDE 0.9 % IJ SOLN: 3.0000 mL | Freq: Two times a day (BID) | INTRAMUSCULAR | Status: DC

## 2011-10-18 MED ORDER — LACTATED RINGERS IV SOLN
INTRAVENOUS | Status: DC
  Administered 2011-10-18: 09:00:00 via INTRAVENOUS

## 2011-10-18 MED ORDER — OXYCODONE-ACETAMINOPHEN 5-325 MG PO TABS: 1.0000 | ORAL_TABLET | ORAL | Status: AC | PRN

## 2011-10-18 MED ORDER — ONDANSETRON HCL 4 MG/2ML IJ SOLN
INTRAMUSCULAR | Status: AC
  Filled 2011-10-18: qty 2

## 2011-10-18 MED ORDER — DEXTROSE 5 % IV SOLN
INTRAVENOUS | Status: AC
  Filled 2011-10-18 (×2): qty 1

## 2011-10-18 MED ORDER — ONDANSETRON HCL 4 MG/2ML IJ SOLN
4.0000 mg | Freq: Once | INTRAMUSCULAR | Status: AC | PRN
  Administered 2011-10-18: 4 mg via INTRAVENOUS

## 2011-10-18 MED ORDER — OXYCODONE HCL 5 MG PO TABS
5.0000 mg | ORAL_TABLET | ORAL | Status: DC | PRN
  Administered 2011-10-18: 5 mg via ORAL

## 2011-10-18 MED ORDER — NEOSTIGMINE METHYLSULFATE 1 MG/ML IJ SOLN
INTRAMUSCULAR | Status: DC | PRN
  Administered 2011-10-18: 4 mg via INTRAVENOUS

## 2011-10-18 MED ORDER — BUPIVACAINE-EPINEPHRINE PF 0.25-1:200000 % IJ SOLN
INTRAMUSCULAR | Status: AC
  Filled 2011-10-18: qty 30

## 2011-10-18 MED ORDER — MORPHINE SULFATE 4 MG/ML IJ SOLN
INTRAMUSCULAR | Status: AC
  Administered 2011-10-18: 2 mg
  Filled 2011-10-18: qty 1

## 2011-10-18 MED ORDER — FENTANYL CITRATE 0.05 MG/ML IJ SOLN
INTRAMUSCULAR | Status: DC | PRN
  Administered 2011-10-18: 150 ug via INTRAVENOUS

## 2011-10-18 MED ORDER — OXYCODONE HCL 5 MG PO TABS
ORAL_TABLET | ORAL | Status: AC
  Filled 2011-10-18: qty 1

## 2011-10-18 MED ORDER — ACETAMINOPHEN 650 MG RE SUPP: 650.0000 mg | RECTAL | Status: DC | PRN

## 2011-10-18 MED ORDER — ONDANSETRON HCL 4 MG/2ML IJ SOLN
INTRAMUSCULAR | Status: DC | PRN
  Administered 2011-10-18: 4 mg via INTRAVENOUS

## 2011-10-18 MED ORDER — BUPIVACAINE-EPINEPHRINE 0.25% -1:200000 IJ SOLN
INTRAMUSCULAR | Status: DC | PRN
  Administered 2011-10-18: 30 mL

## 2011-10-18 SURGICAL SUPPLY — 46 items
ADH SKN CLS APL DERMABOND .7 (GAUZE/BANDAGES/DRESSINGS) ×1
APPLIER CLIP 5 13 M/L LIGAMAX5 (MISCELLANEOUS) ×2
APR CLP MED LRG 5 ANG JAW (MISCELLANEOUS) ×1
BAG SPEC RTRVL LRG 6X4 10 (ENDOMECHANICALS) ×1
BLADE SURG ROTATE 9660 (MISCELLANEOUS) IMPLANT
CANISTER SUCTION 2500CC (MISCELLANEOUS) ×2 IMPLANT
CHLORAPREP W/TINT 26ML (MISCELLANEOUS) ×2 IMPLANT
CLIP APPLIE 5 13 M/L LIGAMAX5 (MISCELLANEOUS) ×1 IMPLANT
CLOTH BEACON ORANGE TIMEOUT ST (SAFETY) ×2 IMPLANT
COVER MAYO STAND STRL (DRAPES) ×2 IMPLANT
COVER SURGICAL LIGHT HANDLE (MISCELLANEOUS) ×2 IMPLANT
DECANTER SPIKE VIAL GLASS SM (MISCELLANEOUS) ×4 IMPLANT
DERMABOND ADVANCED (GAUZE/BANDAGES/DRESSINGS) ×1
DERMABOND ADVANCED .7 DNX12 (GAUZE/BANDAGES/DRESSINGS) ×1 IMPLANT
DEVICE TROCAR PUNCTURE CLOSURE (ENDOMECHANICALS) ×1 IMPLANT
DRAPE C-ARM 42X72 X-RAY (DRAPES) ×2 IMPLANT
ELECT REM PT RETURN 9FT ADLT (ELECTROSURGICAL) ×2
ELECTRODE REM PT RTRN 9FT ADLT (ELECTROSURGICAL) ×1 IMPLANT
ENDOLOOP SUT PDS II  0 18 (SUTURE) ×1
ENDOLOOP SUT PDS II 0 18 (SUTURE) IMPLANT
GLOVE BIO SURGEON STRL SZ7 (GLOVE) ×2 IMPLANT
GLOVE BIO SURGEON STRL SZ7.5 (GLOVE) ×1 IMPLANT
GLOVE BIOGEL PI IND STRL 7.5 (GLOVE) ×2 IMPLANT
GLOVE BIOGEL PI INDICATOR 7.5 (GLOVE) ×2
GLOVE SURG SS PI 6.5 STRL IVOR (GLOVE) ×1 IMPLANT
GOWN STRL NON-REIN LRG LVL3 (GOWN DISPOSABLE) ×8 IMPLANT
HEMOSTAT SNOW SURGICEL 2X4 (HEMOSTASIS) ×1 IMPLANT
KIT BASIN OR (CUSTOM PROCEDURE TRAY) ×2 IMPLANT
KIT ROOM TURNOVER OR (KITS) ×2 IMPLANT
NS IRRIG 1000ML POUR BTL (IV SOLUTION) ×2 IMPLANT
PAD ARMBOARD 7.5X6 YLW CONV (MISCELLANEOUS) ×2 IMPLANT
POUCH SPECIMEN RETRIEVAL 10MM (ENDOMECHANICALS) ×2 IMPLANT
SCISSORS LAP 5X35 DISP (ENDOMECHANICALS) IMPLANT
SET CHOLANGIOGRAPH 5 50 .035 (SET/KITS/TRAYS/PACK) ×2 IMPLANT
SET IRRIG TUBING LAPAROSCOPIC (IRRIGATION / IRRIGATOR) ×2 IMPLANT
SLEEVE ENDOPATH XCEL 5M (ENDOMECHANICALS) ×4 IMPLANT
SPECIMEN JAR SMALL (MISCELLANEOUS) ×2 IMPLANT
SUT MNCRL AB 4-0 PS2 18 (SUTURE) ×2 IMPLANT
SUT VIC AB 2-0 SH 27 (SUTURE) ×2
SUT VIC AB 2-0 SH 27XBRD (SUTURE) IMPLANT
TOWEL OR 17X24 6PK STRL BLUE (TOWEL DISPOSABLE) ×2 IMPLANT
TOWEL OR 17X26 10 PK STRL BLUE (TOWEL DISPOSABLE) ×2 IMPLANT
TRAY LAPAROSCOPIC (CUSTOM PROCEDURE TRAY) ×2 IMPLANT
TROCAR BLADELESS 11MM (ENDOMECHANICALS) ×1 IMPLANT
TROCAR XCEL BLUNT TIP 100MML (ENDOMECHANICALS) ×2 IMPLANT
TROCAR XCEL NON-BLD 5MMX100MML (ENDOMECHANICALS) ×2 IMPLANT

## 2011-10-18 NOTE — Interval H&P Note (Signed)
History and Physical Interval Note:  10/18/2011 9:53 AM  Felicia Salinas  has presented today for surgery, with the diagnosis of gallstones  The various methods of treatment have been discussed with the patient and family. After consideration of risks, benefits and other options for treatment, the patient has consented to  Procedure(s) (LRB): LAPAROSCOPIC CHOLECYSTECTOMY WITH INTRAOPERATIVE CHOLANGIOGRAM (N/A) as a surgical intervention .  The patient's history has been reviewed, patient examined, no change in status, stable for surgery.  I have reviewed the patient's chart and labs.  Questions were answered to the patient's satisfaction.     Ison Wichmann

## 2011-10-18 NOTE — Anesthesia Preprocedure Evaluation (Signed)
Anesthesia Evaluation  Patient identified by MRN, date of birth, ID band Patient awake    Reviewed: Allergy & Precautions, H&P , NPO status , Patient's Chart, lab work & pertinent test results  Airway Mallampati: I TM Distance: >3 FB Neck ROM: Full    Dental   Pulmonary asthma ,          Cardiovascular     Neuro/Psych    GI/Hepatic   Endo/Other    Renal/GU      Musculoskeletal   Abdominal   Peds  Hematology   Anesthesia Other Findings   Reproductive/Obstetrics                           Anesthesia Physical Anesthesia Plan  ASA: III  Anesthesia Plan: General   Post-op Pain Management:    Induction: Intravenous  Airway Management Planned: Oral ETT  Additional Equipment:   Intra-op Plan:   Post-operative Plan: Extubation in OR  Informed Consent: I have reviewed the patients History and Physical, chart, labs and discussed the procedure including the risks, benefits and alternatives for the proposed anesthesia with the patient or authorized representative who has indicated his/her understanding and acceptance.     Plan Discussed with: CRNA and Surgeon  Anesthesia Plan Comments:         Anesthesia Quick Evaluation

## 2011-10-18 NOTE — Preoperative (Signed)
Beta Blockers   Reason not to administer Beta Blockers:Not Applicable 

## 2011-10-18 NOTE — H&P (View-Only) (Signed)
Patient ID: Felicia Salinas, female   DOB: 11/29/1990, 21 y.o.   MRN: [id number]  Chief Complaint  Patient presents with  . Pre-op Exam    eval gallstones    HPI Felicia Salinas is a 21 y.o. female.   HPI This is a 21-year-old female I know from an open appendectomy for gangrenous appendicitis. She had a fairly long postoperative course but is now doing just fine from that. In May she had some generalized upper abdominal pain that was made much worse by eating at a family wedding. This was associated with nausea. She also has some bloating and some constipation. She points to her epigastrium when asked to identify the site of pain. She has had no further episodes but she is significantly change her diet since then. She had elevated liver function tests at that time. A CT scan and an ultrasound show gallstones as well as one stone in her neck. There are no issues it appears with her prior appendectomy. She was evaluated by gastroenterology and then was also referred to see me for treatment of what appears to be symptomatic cholelithiasis.  Past Medical History  Diagnosis Date  . Asthma   . Environmental allergies   . Gallstones     Past Surgical History  Procedure Date  . Tonsillectomy   . Appendectomy 04/07/2011  . Wisdom tooth extraction     Family History  Problem Relation Age of Onset  . Breast cancer Maternal Grandmother   . Ovarian cancer Paternal Aunt   . Ovarian cancer Maternal Aunt   . Diabetes Sister   . Diabetes Paternal Grandfather   . Diabetes Maternal Grandmother   . Hypertension Maternal Grandfather   . Colon cancer Neg Hx     Social History History  Substance Use Topics  . Smoking status: Never Smoker   . Smokeless tobacco: Never Used  . Alcohol Use: Yes     Occasional    Allergies  Allergen Reactions  . Benadryl (Diphenhydramine Hcl) Hives  . Other Hives    Chicken, shrimp, dairy products, shellfish  . Eggs Or Egg-Derived Products Hives and Rash  .  Tape Rash    Current Outpatient Prescriptions  Medication Sig Dispense Refill  . albuterol (PROVENTIL HFA;VENTOLIN HFA) 108 (90 BASE) MCG/ACT inhaler Inhale 2 puffs into the lungs every 4 (four) hours as needed. For shortness of breath      . beclomethasone (QVAR) 40 MCG/ACT inhaler Inhale 2 puffs into the lungs 2 (two) times daily.      . fexofenadine (ALLEGRA) 30 MG tablet Take 30 mg by mouth daily as needed. For allergies      . ibuprofen (ADVIL,MOTRIN) 200 MG tablet Take 800 mg by mouth every 6 (six) hours as needed. For pain      . loratadine (CLARITIN) 10 MG tablet Take 10 mg by mouth daily as needed. For allergies      . Multiple Vitamin (MULITIVITAMIN WITH MINERALS) TABS Take 1 tablet by mouth daily.      . ranitidine (ZANTAC) 150 MG tablet Take 1 tablet by mouth Once daily as needed. For reflux      . traMADol (ULTRAM) 50 MG tablet Take 50 mg by mouth every 4 (four) hours as needed. For pain        Review of Systems Review of Systems  Constitutional: Negative for fever, chills and unexpected weight change.  HENT: Negative for hearing loss, congestion, sore throat, trouble swallowing and voice change.   Eyes:   Negative for visual disturbance.  Respiratory: Negative for cough and wheezing.   Cardiovascular: Negative for chest pain, palpitations and leg swelling.  Gastrointestinal: Positive for nausea and abdominal pain. Negative for vomiting, diarrhea, constipation, blood in stool, abdominal distention and anal bleeding.  Genitourinary: Negative for hematuria, vaginal bleeding and difficulty urinating.  Musculoskeletal: Negative for arthralgias.  Skin: Negative for rash and wound.  Neurological: Negative for seizures, syncope and headaches.  Hematological: Negative for adenopathy. Does not bruise/bleed easily.  Psychiatric/Behavioral: Negative for confusion.    Blood pressure 110/70, pulse 87, temperature 96.9 F (36.1 C), temperature source Temporal, height 5' 5" (1.651 m),  weight 250 lb (113.399 kg), SpO2 98.00%.  Physical Exam Physical Exam  Vitals reviewed. Constitutional: She appears well-developed and well-nourished.  Eyes: No scleral icterus.  Cardiovascular: Normal rate, regular rhythm and normal heart sounds.   Pulmonary/Chest: Effort normal and breath sounds normal. She has no wheezes. She has no rales.  Abdominal: Soft. Bowel sounds are normal. There is no tenderness. No hernia.      Data Reviewed ABDOMINAL ULTRASOUND COMPLETE  Comparison: CT same date  Findings:  Gallbladder: Multiple dependent gallstones are noted without  gallbladder Salinas thickening, sonographic Murphy's sign, or  pericholecystic fluid. Largest measures 1.7 cm at the level of the  gallbladder neck and is not mobile.  Common Bile Duct: 5 mm. Within normal limits without visualized  filling defect.  Liver: No focal mass lesion identified. Within normal limits in  parenchymal echogenicity.  IVC: Appears normal.  Pancreas: Obscured by bowel gas.  Spleen: Within normal limits in size and echotexture.  Right kidney: Normal in size and parenchymal echogenicity. No  evidence of mass or hydronephrosis.  Left kidney: Normal in size and parenchymal echogenicity. No  evidence of mass or hydronephrosis.  Abdominal Aorta: No aneurysm identified. Bifurcation itself is  not visualized.  IMPRESSION:  Gallstones without other sonographic evidence for acute  cholecystitis. Largest 1.7 cm gallstone at the level of the neck  is not visualized to be mobile, but there is no other specific  evidence for cholecystitis.    Assessment    Symptomatic cholelithiasis    Plan    I agree with Dr. Gessner and do think she likely has sx cholelithiasis.  We discussed a lap chole for treatment of disease as well as to prevent further episodes or passage of stones. I discussed the procedure in detail.  The patient was given educational material.  We discussed the risks and benefits of a  laparoscopic cholecystectomy and possible cholangiogram including, but not limited to bleeding, infection, injury to surrounding structures such as the intestine or liver, bile leak, retained gallstones, need to convert to an open procedure, prolonged diarrhea, blood clots such as  DVT, common bile duct injury, anesthesia risks, and possible need for additional procedures.  The likelihood of improvement in symptoms and return to the patient's normal status is good. We discussed the typical post-operative recovery course.       Malaiya Paczkowski 09/18/2011, 9:37 AM    

## 2011-10-18 NOTE — Anesthesia Postprocedure Evaluation (Signed)
Anesthesia Post Note  Patient: Felicia Salinas  Procedure(s) Performed: Procedure(s) (LRB): LAPAROSCOPIC CHOLECYSTECTOMY WITH INTRAOPERATIVE CHOLANGIOGRAM (N/A)  Anesthesia type: general  Patient location: PACU  Post pain: Pain level controlled  Post assessment: Patient's Cardiovascular Status Stable  Last Vitals:  Filed Vitals:   10/18/11 1415  BP: 128/86  Pulse: 67  Temp:   Resp: 21    Post vital signs: Reviewed and stable  Level of consciousness: sedated  Complications: No apparent anesthesia complications

## 2011-10-18 NOTE — Transfer of Care (Signed)
Immediate Anesthesia Transfer of Care Note  Patient: Felicia Salinas  Procedure(s) Performed: Procedure(s) (LRB): LAPAROSCOPIC CHOLECYSTECTOMY WITH INTRAOPERATIVE CHOLANGIOGRAM (N/A)  Patient Location: PACU  Anesthesia Type: General  Level of Consciousness: awake, oriented and sedated  Airway & Oxygen Therapy: Patient Spontanous Breathing and Patient connected to face mask oxygen  Post-op Assessment: Report given to PACU RN and Post -op Vital signs reviewed and stable  Post vital signs: Reviewed and stable  Complications: No apparent anesthesia complications

## 2011-10-18 NOTE — Anesthesia Procedure Notes (Signed)
Procedure Name: Intubation Date/Time: 10/18/2011 10:21 AM Performed by: Margaree Mackintosh Pre-anesthesia Checklist: Patient identified, Emergency Drugs available, Suction available, Patient being monitored and Timeout performed Patient Re-evaluated:Patient Re-evaluated prior to inductionOxygen Delivery Method: Circle system utilized Preoxygenation: Pre-oxygenation with 100% oxygen Intubation Type: IV induction Ventilation: Mask ventilation without difficulty Laryngoscope Size: Mac and 3 Grade View: Grade I Tube type: Oral Tube size: 7.0 mm Number of attempts: 1 Airway Equipment and Method: Stylet Placement Confirmation: ETT inserted through vocal cords under direct vision,  positive ETCO2 and breath sounds checked- equal and bilateral Secured at: 23 cm Tube secured with: Tape Dental Injury: Teeth and Oropharynx as per pre-operative assessment

## 2011-10-18 NOTE — Op Note (Signed)
Preoperative diagnosis: Symptomatic cholelithiasis Postoperative diagnosis: Chronic cholecystitis and symptomatic cholelithiasis Procedure: Laparoscopic cholecystectomy Surgeon: Dr. Harden Mo Asst.: Dr. Darnell Level Specimens: Gallbladder and contents to pathology Estimated blood loss: Minimal Complications: None Drains: None Anesthesia: Gen. Endotracheal Sponge and needle count was correct x2 at end of operation Disposition to recovery in stable condition  Indications: This is a 21 year old female I know well from an open appendectomy for gangrenous appendicitis. She then presented with right upper quadrant pain and gallstones on ultrasound. She appears to have symptomatic cholelithiasis and she and I discussed a laparoscopic cholecystectomy.  Procedure: After informed consent was obtained the patient was taken to the operating room. She was administered 2 g of intravenous cefoxitin. Sequential compression devices were placed on her legs prior to beginning. She was then placed under general endotracheal anesthesia without complication. Her abdomen was prepped and draped in the standard sterile surgical fashion. An oral gastric tube was placed in her stomach was evacuated. Surgical time out was performed.  I made a 5 mm incision in her left upper quadrant and access to review the Optiview technique with a 5 mm trocar using a 0 scope. This did hit the omentum but there was no evidence of any injury upon entry. I looked a couple of times at this and there was no evidence of injury. Once I had done this we then put a trocar in the right side of the abdomen. We then used blunt dissection to take down some adhesions around the umbilicus. I then placed a 10 mm trocar below the umbilicus. We then placed 2 further 5 mm trocars in the epigastrium and right upper quadrant under direct vision without complication. Her gallbladder was retracted cephalad. Her gallbladder was very contracted, intrahepatic,  and had evidence of chronic cholecystitis. This was eventually retracted cephalad and lateral. This was very difficult dissection as her gallbladder was very scarred in. Eventually we are able to obtain the critical view of safety and the entire triangle was dissected using both the cystic duct and artery. Due to the fact that her cystic duct was very short I decided not to do a cholangiogram. I then placed clips across the duct and divided this. This was very close to the common duct as the duct was not long. These clips were in good position upon completion and I was happy with that. I then treated the 2 branches of the artery in a similar fashion. I then removed the gallbladder from the liver bed with some difficulty as it was very adherent to that. The gallbladder bed she was placed in Endo Catch bag and removed from the umbilicus. I then obtained hemostasis. I did place a piece of Surgicel on the liver bed. Irrigation was performed until this was clear. I then removed my 10 mm trocar. I then used the Endo Close device and a 2-0 Vicryl to close the incision. I then desufflated the abdomen removed all the trocars. These were closed with 4 Monocryl and Dermabond. She tolerated as well as extubated and transferred to recovery stable.

## 2011-10-19 ENCOUNTER — Encounter (HOSPITAL_COMMUNITY): Payer: Self-pay | Admitting: General Surgery

## 2011-11-02 ENCOUNTER — Encounter (INDEPENDENT_AMBULATORY_CARE_PROVIDER_SITE_OTHER): Payer: BC Managed Care – PPO | Admitting: General Surgery

## 2011-11-05 ENCOUNTER — Ambulatory Visit (INDEPENDENT_AMBULATORY_CARE_PROVIDER_SITE_OTHER): Payer: BC Managed Care – PPO | Admitting: General Surgery

## 2011-11-05 ENCOUNTER — Encounter (INDEPENDENT_AMBULATORY_CARE_PROVIDER_SITE_OTHER): Payer: Self-pay | Admitting: General Surgery

## 2011-11-05 VITALS — BP 124/72 | HR 70 | Temp 98.2°F | Resp 16 | Ht 64.0 in | Wt 242.0 lb

## 2011-11-05 DIAGNOSIS — Z09 Encounter for follow-up examination after completed treatment for conditions other than malignant neoplasm: Secondary | ICD-10-CM

## 2011-11-05 NOTE — Progress Notes (Signed)
Subjective:     Patient ID: Felicia Salinas, female   DOB: December 19, 1990, 21 y.o.   MRN: [id number]  HPI This is a 21 year old female I know from a perforated appendix requiring  appendectomy in an open fashion. Following this she began to have systematic cholelithiasis. I took her to the operating room on July 25 for a laparoscopic cholecystectomy. She did well after this. Her pathology shows chronic cholecystitis and cholelithiasis. She returns today doing well without any real complaints. She has an abdominal wall pain and some constipation.  Review of Systems     Objective:   Physical Exam Incisions healing well without infection    Assessment:     Status post laparoscopic cholecystectomy    Plan:     She to return to work next week. I told her to wait a month before doing any strenuous exercise or lifting. I think she just has some abdominal wall pain which should resolve over some time. I discussed MiraLAX or milk of magnesia for constipation. She's going to call me back if she needs anything otherwise I will see her as needed.

## 2011-12-21 ENCOUNTER — Encounter (HOSPITAL_COMMUNITY): Payer: Self-pay | Admitting: *Deleted

## 2011-12-21 ENCOUNTER — Emergency Department (HOSPITAL_COMMUNITY)
Admission: EM | Admit: 2011-12-21 | Discharge: 2011-12-21 | Disposition: A | Discharge status: Home / Self Care | Payer: BC Managed Care – PPO | Attending: Emergency Medicine | Admitting: Emergency Medicine

## 2011-12-21 DIAGNOSIS — T148 Other injury of unspecified body region: Secondary | ICD-10-CM | POA: Insufficient documentation

## 2011-12-21 DIAGNOSIS — W57XXXA Bitten or stung by nonvenomous insect and other nonvenomous arthropods, initial encounter: Secondary | ICD-10-CM | POA: Insufficient documentation

## 2011-12-21 DIAGNOSIS — Y92009 Unspecified place in unspecified non-institutional (private) residence as the place of occurrence of the external cause: Secondary | ICD-10-CM | POA: Insufficient documentation

## 2011-12-21 MED ORDER — SULFAMETHOXAZOLE-TRIMETHOPRIM 800-160 MG PO TABS: 1.0000 | ORAL_TABLET | Freq: Two times a day (BID) | ORAL | Status: AC

## 2011-12-21 MED ORDER — PERMETHRIN 5 % EX CREA: TOPICAL_CREAM | CUTANEOUS | Status: DC

## 2011-12-21 NOTE — ED Provider Notes (Signed)
History     CSN: [id number]  Arrival date & time 12/21/11  0037   First MD Initiated Contact with Patient 12/21/11 0105      Chief Complaint  Patient presents with  . Insect Bite    (Consider location/radiation/quality/duration/timing/severity/associated sxs/prior treatment) Patient is a 22 y.o. female presenting with animal bite.  Animal Bite  The incident occurred more than 2 days ago. The incident occurred at home. She came to the ER via personal transport. There is an injury to the chin. The patient is experiencing no pain. It is unlikely that a foreign body is present. Her tetanus status is UTD.    Past Medical History  Diagnosis Date  . Environmental allergies   . Asthma     controlled per patient last attack in January    Past Surgical History  Procedure Date  . Tonsillectomy   . Wisdom tooth extraction   . Cholecystectomy 10/18/2011    Procedure: LAPAROSCOPIC CHOLECYSTECTOMY WITH INTRAOPERATIVE CHOLANGIOGRAM;  Surgeon: Emelia Loron, MD;  Location: [facility] OR;  Service: General;  Laterality: N/A;  . Appendectomy 04/07/2011    Family History  Problem Relation Age of Onset  . Breast cancer Maternal Grandmother   . Ovarian cancer Paternal Aunt   . Ovarian cancer Maternal Aunt   . Diabetes Sister   . Diabetes Paternal Grandfather   . Diabetes Maternal Grandmother   . Hypertension Maternal Grandfather   . Colon cancer Neg Hx     History  Substance Use Topics  . Smoking status: Never Smoker   . Smokeless tobacco: Never Used  . Alcohol Use: Yes     Occasional    OB History    Grav Para Term Preterm Abortions TAB SAB Ect Mult Living                  Review of Systems  Skin: Positive for color change.  All other systems reviewed and are negative.    Allergies  Benadryl; Other; Eggs or egg-derived products; and Tape  Home Medications   Current Outpatient Rx  Name Route Sig Dispense Refill  . ALBUTEROL SULFATE HFA 108 (90 BASE) MCG/ACT IN AERS  Inhalation Inhale 2 puffs into the lungs every 4 (four) hours as needed. For shortness of breath    . BECLOMETHASONE DIPROPIONATE 40 MCG/ACT IN AERS Inhalation Inhale 2 puffs into the lungs 2 (two) times daily.    Marland Kitchen FEXOFENADINE HCL 30 MG PO TABS Oral Take 30 mg by mouth daily as needed. For allergies    . IBUPROFEN 200 MG PO TABS Oral Take 800 mg by mouth every 6 (six) hours as needed. For pain    . LORATADINE 10 MG PO TABS Oral Take 10 mg by mouth daily as needed. For allergies    . ADULT MULTIVITAMIN W/MINERALS CH Oral Take 1 tablet by mouth daily.    . OXYCODONE-ACETAMINOPHEN 5-325 MG PO TABS  as needed.    Marland Kitchen TRAMADOL HCL 50 MG PO TABS Oral Take 50 mg by mouth every 4 (four) hours as needed. For pain      BP 117/71  Pulse 81  Temp 97.6 F (36.4 C) (Oral)  Resp 18  SpO2 100%  Physical Exam  Constitutional: She is oriented to person, place, and time. She appears well-developed and well-nourished.  HENT:  Head: Normocephalic and atraumatic.  Eyes: Conjunctivae normal and EOM are normal. Pupils are equal, round, and reactive to light.  Neck: Normal range of motion.  Cardiovascular: Normal rate, regular  rhythm and normal heart sounds.   Pulmonary/Chest: Effort normal and breath sounds normal.  Abdominal: Soft. Bowel sounds are normal.  Musculoskeletal: Normal range of motion.  Neurological: She is alert and oriented to person, place, and time.  Skin: Skin is warm and dry. Rash noted. There is erythema.       Superficial erythema to chin with dime sized area of impetigo.  Similar lesion to left breast,  hand  Psychiatric: She has a normal mood and affect. Her behavior is normal.    ED Course  Procedures (including critical care time)  Labs Reviewed - No data to display No results found.   No diagnosis found.    MDM  + multiple insect bite.  Will bactrim for crusting area.  Dc to [facility] outpt.  Signigicant other without bites        Twinkle Sockwell Lytle Michaels, MD 12/21/11  0111

## 2011-12-21 NOTE — ED Notes (Signed)
Pt here c/o multiple insect bites.  She is worried she has a spider bite on her chin and possible mosquito bites to her hand, chin, etc.  The "bite" to her chin is macular, weeping with a crust and feels stiff.  The other bites are swollen, itchy and sore.

## 2011-12-29 ENCOUNTER — Encounter (HOSPITAL_COMMUNITY): Payer: Self-pay | Admitting: *Deleted

## 2011-12-29 ENCOUNTER — Emergency Department (HOSPITAL_COMMUNITY)
Admission: EM | Admit: 2011-12-29 | Discharge: 2011-12-29 | Disposition: A | Discharge status: Home / Self Care | Payer: BC Managed Care – PPO | Source: Home / Self Care | Attending: Emergency Medicine | Admitting: Emergency Medicine

## 2011-12-29 DIAGNOSIS — L309 Dermatitis, unspecified: Secondary | ICD-10-CM

## 2011-12-29 DIAGNOSIS — L03211 Cellulitis of face: Secondary | ICD-10-CM

## 2011-12-29 DIAGNOSIS — L259 Unspecified contact dermatitis, unspecified cause: Secondary | ICD-10-CM

## 2011-12-29 DIAGNOSIS — L0201 Cutaneous abscess of face: Secondary | ICD-10-CM

## 2011-12-29 MED ORDER — TRIAMCINOLONE ACETONIDE 0.1 % EX CREA: TOPICAL_CREAM | Freq: Two times a day (BID) | CUTANEOUS | Status: DC

## 2011-12-29 MED ORDER — DOXYCYCLINE HYCLATE 100 MG PO CAPS: 100.0000 mg | ORAL_CAPSULE | Freq: Two times a day (BID) | ORAL | Status: DC

## 2011-12-29 MED ORDER — TRIAMCINOLONE ACETONIDE 0.1 % EX CREA: TOPICAL_CREAM | Freq: Two times a day (BID) | CUTANEOUS | Status: AC

## 2011-12-29 NOTE — ED Notes (Signed)
Pt  Has   Some  Swelling   l  Side  Of face     -  P[t  Was  Seen  1  Week   Ago  For  Skin  Infection   Was  Placed  On  Anti  Biotics    And   Cream

## 2011-12-29 NOTE — ED Provider Notes (Addendum)
History     CSN: [id number]  Arrival date & time 12/29/11  1106   First MD Initiated Contact with Patient 12/29/11 1144      Chief Complaint  Patient presents with  . Facial Swelling    (Consider location/radiation/quality/duration/timing/severity/associated sxs/prior treatment) HPI Comments: Patient describes that she went to the emergency department about a week ago with his rash on her face and on her right forearm. She feels that this occurred after she had slept on a carpet in a new house. As she might have been bitten by some insects or that she has allergies to it. Feels the antibiotic but she's been taking have not completely worked and she feels some degree of tenderness and continues to see some bruising from one of the lesions on his face with some honey crusted material in the same way she observes the same or seem to another lesion she has on her right forearm. Patient denies any other symptoms such as fevers, chills malaise, arthralgias or changes in her appetite.  The history is provided by the patient.    Past Medical History  Diagnosis Date  . Environmental allergies   . Asthma     controlled per patient last attack in January    Past Surgical History  Procedure Date  . Tonsillectomy   . Wisdom tooth extraction   . Cholecystectomy 10/18/2011    Procedure: LAPAROSCOPIC CHOLECYSTECTOMY WITH INTRAOPERATIVE CHOLANGIOGRAM;  Surgeon: Emelia Loron, MD;  Location: [facility] OR;  Service: General;  Laterality: N/A;  . Appendectomy 04/07/2011    Family History  Problem Relation Age of Onset  . Breast cancer Maternal Grandmother   . Ovarian cancer Paternal Aunt   . Ovarian cancer Maternal Aunt   . Diabetes Sister   . Diabetes Paternal Grandfather   . Diabetes Maternal Grandmother   . Hypertension Maternal Grandfather   . Colon cancer Neg Hx     History  Substance Use Topics  . Smoking status: Never Smoker   . Smokeless tobacco: Never Used  . Alcohol Use: Yes   Occasional    OB History    Grav Para Term Preterm Abortions TAB SAB Ect Mult Living                  Review of Systems  Constitutional: Negative for fever, activity change, appetite change and fatigue.  Respiratory: Negative for shortness of breath.   Skin: Positive for rash. Negative for pallor and wound.    Allergies  Benadryl; Other; Eggs or egg-derived products; and Tape  Home Medications   Current Outpatient Rx  Name Route Sig Dispense Refill  . ALBUTEROL SULFATE HFA 108 (90 BASE) MCG/ACT IN AERS Inhalation Inhale 2 puffs into the lungs every 4 (four) hours as needed. For shortness of breath    . DOXYCYCLINE HYCLATE 100 MG PO CAPS Oral Take 1 capsule (100 mg total) by mouth 2 (two) times daily. 20 capsule 0  . IBUPROFEN 200 MG PO TABS Oral Take 800 mg by mouth every 6 (six) hours as needed. For pain    . LORATADINE 10 MG PO TABS Oral Take 10 mg by mouth daily as needed. For allergies    . ADULT MULTIVITAMIN W/MINERALS CH Oral Take 1 tablet by mouth daily.    Marland Kitchen PERMETHRIN 5 % EX CREA  Apply to affected area once 10 g 0  . SULFAMETHOXAZOLE-TRIMETHOPRIM 800-160 MG PO TABS Oral Take 1 tablet by mouth 2 (two) times daily. 14 tablet 0  . TRIAMCINOLONE  ACETONIDE 0.1 % EX CREA Topical Apply topically 2 (two) times daily. To not apply to face for more than 5 or 7 days. 30 g 0    BP 126/80  Pulse 78  Temp 99.2 F (37.3 C) (Oral)  Resp 16  SpO2 99%  LMP 12/04/2011  Physical Exam  Nursing note and vitals reviewed. Constitutional: Vital signs are normal. She appears well-developed and well-nourished.  Non-toxic appearance. She does not have a sickly appearance. No distress.  Skin: Rash noted. No abrasion and no laceration noted. There is erythema.       ED Course  Procedures (including critical care time)   Labs Reviewed  WOUND CULTURE   No results found.   1. Facial cellulitis   2. Dermatitis       MDM  Possibly secondary infected insect bites versus contact  or allergenic dermatitis. Patient has been taking Bactrim and have encouraged her to finish the treatment course for 2 more days to complete her doxycycline course. Was also prescribed triamcinolone cream, not abuse for more than 5 days on the face. Discussed with patient if this is not fully resolve 10-14 days to return if she we might need to refer to a dermatologist. Patient agrees with treatment plan and followup care as necessary     Jimmie Molly, MD 12/29/11 2039  Jimmie Molly, MD 12/29/11 2039

## 2012-01-01 LAB — WOUND CULTURE

## 2012-03-28 ENCOUNTER — Emergency Department (INDEPENDENT_AMBULATORY_CARE_PROVIDER_SITE_OTHER)
Admission: EM | Admit: 2012-03-28 | Discharge: 2012-03-28 | Disposition: A | Discharge status: Home / Self Care | Payer: BC Managed Care – PPO | Source: Home / Self Care | Attending: Emergency Medicine | Admitting: Emergency Medicine

## 2012-03-28 DIAGNOSIS — H01004 Unspecified blepharitis left upper eyelid: Secondary | ICD-10-CM

## 2012-03-28 DIAGNOSIS — H01009 Unspecified blepharitis unspecified eye, unspecified eyelid: Secondary | ICD-10-CM

## 2012-03-28 MED ORDER — TETRACAINE HCL 0.5 % OP SOLN
OPHTHALMIC | Status: AC
  Filled 2012-03-28: qty 2

## 2012-03-28 MED ORDER — TOBRAMYCIN 0.3 % OP SOLN: 1.0000 [drp] | Freq: Four times a day (QID) | OPHTHALMIC | Status: DC

## 2012-03-28 MED ORDER — TOBRAMYCIN 0.3 % OP SOLN: 1.0000 [drp] | Freq: Four times a day (QID) | OPHTHALMIC | Status: AC

## 2012-03-28 NOTE — ED Provider Notes (Signed)
History     CSN: [id number]  Arrival date & time 03/28/12  1639   First MD Initiated Contact with Patient 03/28/12 1727      Chief Complaint  Patient presents with  . Eye Problem    (Consider location/radiation/quality/duration/timing/severity/associated sxs/prior treatment) HPI Comments: Patient started with sudden onset of discomfort on her left upper eyelid " feels as if there is something in it", for the last few hours it has gotten progressively worse and now look swollen" ( referring to her upper eyelid). Denies any visual changes with it does bother her when she blinks or touches her left upper eyelid. She does not recall hitting her eye or injuring her eye in any way.  Patient is a 22 y.o. female presenting with eye problem. The history is provided by the patient.  Eye Problem  This is a new problem. The current episode started 12 to 24 hours ago. The problem occurs constantly. The problem has not changed since onset.The left eye is affected.The pain is at a severity of 3/10. The pain is mild. There is no history of trauma to the eye. There is no known exposure to pink eye. She does not wear contacts. Associated symptoms include foreign body sensation and eye redness. Pertinent negatives include no numbness, no blurred vision, no discharge, no double vision, no photophobia, no tingling, no weakness and no itching. She has tried water for the symptoms. The treatment provided no relief.    Past Medical History  Diagnosis Date  . Environmental allergies   . Asthma     controlled per patient last attack in January    Past Surgical History  Procedure Date  . Tonsillectomy   . Wisdom tooth extraction   . Cholecystectomy 10/18/2011    Procedure: LAPAROSCOPIC CHOLECYSTECTOMY WITH INTRAOPERATIVE CHOLANGIOGRAM;  Surgeon: Emelia Loron, MD;  Location: [facility] OR;  Service: General;  Laterality: N/A;  . Appendectomy 04/07/2011    Family History  Problem Relation Age of Onset  . Breast  cancer Maternal Grandmother   . Ovarian cancer Paternal Aunt   . Ovarian cancer Maternal Aunt   . Diabetes Sister   . Diabetes Paternal Grandfather   . Diabetes Maternal Grandmother   . Hypertension Maternal Grandfather   . Colon cancer Neg Hx     History  Substance Use Topics  . Smoking status: Never Smoker   . Smokeless tobacco: Never Used  . Alcohol Use: Yes     Comment: Occasional    OB History    Grav Para Term Preterm Abortions TAB SAB Ect Mult Living                  Review of Systems  Constitutional: Negative for chills, diaphoresis, activity change and appetite change.  Eyes: Positive for redness and itching. Negative for blurred vision, double vision, photophobia, pain, discharge and visual disturbance.  Skin: Negative for itching.  Neurological: Negative for dizziness, tingling, weakness, numbness and headaches.    Allergies  Benadryl; Other; Eggs or egg-derived products; and Tape  Home Medications   Current Outpatient Rx  Name  Route  Sig  Dispense  Refill  . ALBUTEROL SULFATE HFA 108 (90 BASE) MCG/ACT IN AERS   Inhalation   Inhale 2 puffs into the lungs every 4 (four) hours as needed. For shortness of breath         . DOXYCYCLINE HYCLATE 100 MG PO CAPS   Oral   Take 1 capsule (100 mg total) by mouth 2 (two) times  daily.   20 capsule   0   . IBUPROFEN 200 MG PO TABS   Oral   Take 800 mg by mouth every 6 (six) hours as needed. For pain         . LORATADINE 10 MG PO TABS   Oral   Take 10 mg by mouth daily as needed. For allergies         . ADULT MULTIVITAMIN W/MINERALS CH   Oral   Take 1 tablet by mouth daily.         Marland Kitchen PERMETHRIN 5 % EX CREA      Apply to affected area once   10 g   0   . TOBRAMYCIN SULFATE 0.3 % OP SOLN   Left Eye   Place 1 drop into the left eye every 6 (six) hours.   5 mL   0     BP 115/83  Pulse 74  Temp 98.4 F (36.9 C) (Oral)  Resp 18  SpO2 99%  Physical Exam  Nursing note and vitals  reviewed. Constitutional: She appears well-developed and well-nourished.  Eyes: No foreign bodies found. Right eye exhibits no discharge and no exudate. No foreign body present in the right eye. Left eye exhibits no discharge and no exudate. Left conjunctiva is injected. Left conjunctiva has no hemorrhage. No scleral icterus. Left eye exhibits normal extraocular motion and no nystagmus.    Neurological: She is alert.  Skin: No rash noted. No erythema.    ED Course  Procedures (including critical care time)  Labs Reviewed - No data to display No results found.   1. Blepharitis of left upper eyelid       MDM  Left upper eyelid blepharitis. Patient has been prescribed antibiotic eye drop.        Jimmie Molly, MD 03/28/12 [phone]

## 2012-03-28 NOTE — ED Notes (Signed)
C/o discomfort eye today ; seen by MD

## 2012-06-21 ENCOUNTER — Encounter (HOSPITAL_COMMUNITY): Payer: Self-pay | Admitting: *Deleted

## 2012-06-21 ENCOUNTER — Emergency Department (HOSPITAL_COMMUNITY)
Admission: EM | Admit: 2012-06-21 | Discharge: 2012-06-21 | Disposition: A | Discharge status: Home / Self Care | Payer: BC Managed Care – PPO | Attending: Emergency Medicine | Admitting: Emergency Medicine

## 2012-06-21 DIAGNOSIS — Z79899 Other long term (current) drug therapy: Secondary | ICD-10-CM | POA: Insufficient documentation

## 2012-06-21 DIAGNOSIS — R220 Localized swelling, mass and lump, head: Secondary | ICD-10-CM | POA: Insufficient documentation

## 2012-06-21 DIAGNOSIS — L02519 Cutaneous abscess of unspecified hand: Secondary | ICD-10-CM | POA: Insufficient documentation

## 2012-06-21 DIAGNOSIS — J45909 Unspecified asthma, uncomplicated: Secondary | ICD-10-CM | POA: Insufficient documentation

## 2012-06-21 DIAGNOSIS — L03114 Cellulitis of left upper limb: Secondary | ICD-10-CM

## 2012-06-21 MED ORDER — METHYLPREDNISOLONE 4 MG PO KIT: PACK | ORAL | Status: DC

## 2012-06-21 MED ORDER — SULFAMETHOXAZOLE-TRIMETHOPRIM 800-160 MG PO TABS: 1.0000 | ORAL_TABLET | Freq: Two times a day (BID) | ORAL | Status: DC

## 2012-06-21 NOTE — ED Provider Notes (Signed)
History    This chart was scribed for non-physician practitioner working with Gilda Crease, * by Leone Payor, ED Scribe. This patient was seen in room TR09C/TR09C and the patient's care was started at 1943.   CSN: [id number]  Arrival date & time 06/21/12  1943   First MD Initiated Contact with Patient 06/21/12 2002      Chief Complaint  Patient presents with  . Rash     The history is provided by the patient. No language interpreter was used.    Felicia Salinas is a 22 y.o. female who presents to the Emergency Department complaining of new, constant bumps to left anterior forearm/wrist, mid back, and neck starting earlier this week. Pt states the bumps are itchy and painful. States she has had similar symptoms twice before. Pt reports the first time she was seen for these symptoms, she was told it was a bug bite. She states that this episode is worse than the previous times. She denies using new products or having new furniture recently. 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  No new soaps, laundry detergents, or other personal care products.  Significant other is without lesions.  She denies taking benadryl because she is allergic.  Pt has h/o asthma, environmental allergies.   Past Medical History  Diagnosis Date  . Environmental allergies   . Asthma     controlled per patient last attack in January    Past Surgical History  Procedure Laterality Date  . Tonsillectomy    . Wisdom tooth extraction    . Cholecystectomy  10/18/2011    Procedure: LAPAROSCOPIC CHOLECYSTECTOMY WITH INTRAOPERATIVE CHOLANGIOGRAM;  Surgeon: Emelia Loron, MD;  Location: [facility] OR;  Service: General;  Laterality: N/A;  . Appendectomy  04/07/2011    Family History  Problem Relation Age of Onset  . Breast cancer Maternal Grandmother   . Ovarian cancer Paternal Aunt   . Ovarian cancer Maternal Aunt   . Diabetes Sister   . Diabetes Paternal  Grandfather   . Diabetes Maternal Grandmother   . Hypertension Maternal Grandfather   . Colon cancer Neg Hx     History  Substance Use Topics  . Smoking status: Never Smoker   . Smokeless tobacco: Never Used  . Alcohol Use: Yes     Comment: Occasional    OB History   Grav Para Term Preterm Abortions TAB SAB Ect Mult Living                  Review of Systems  HENT: Positive for facial swelling.   Skin: Positive for rash.  All other systems reviewed and are negative.    Allergies  Benadryl; Other; Eggs or egg-derived products; and Tape  Home Medications   Current Outpatient Rx  Name  Route  Sig  Dispense  Refill  . albuterol (PROVENTIL HFA;VENTOLIN HFA) 108 (90 BASE) MCG/ACT inhaler   Inhalation   Inhale 2 puffs into the lungs every 4 (four) hours as needed. For shortness of breath         . doxycycline (VIBRAMYCIN) 100 MG capsule   Oral   Take 1 capsule (100 mg total) by mouth 2 (two) times daily.   20 capsule   0   . ibuprofen (ADVIL,MOTRIN) 200 MG tablet   Oral   Take 800 mg by mouth every 6 (six) hours as needed. For pain         . loratadine (  CLARITIN) 10 MG tablet   Oral   Take 10 mg by mouth daily as needed. For allergies         . Multiple Vitamin (MULITIVITAMIN WITH MINERALS) TABS   Oral   Take 1 tablet by mouth daily.         . permethrin (ELIMITE) 5 % cream      Apply to affected area once   10 g   0     BP 108/66  Pulse 77  Temp(Src) 98.5 F (36.9 C) (Oral)  SpO2 95%  LMP 05/28/2012  Physical Exam  Nursing note and vitals reviewed. Constitutional: She is oriented to person, place, and time. She appears well-developed and well-nourished.  HENT:  Head: Normocephalic and atraumatic.  Mouth/Throat: Oropharynx is clear and moist.  Eyes: Conjunctivae and EOM are normal. Pupils are equal, round, and reactive to light.  Neck: Normal range of motion.  Cardiovascular: Normal rate, regular rhythm and normal heart sounds.    Pulmonary/Chest: Effort normal and breath sounds normal.  Abdominal: Soft. Bowel sounds are normal.  Musculoskeletal:       Arms: Neurological: She is alert and oriented to person, place, and time.  Skin: Skin is warm and dry.  2 small lesions on R scapula without signs of abscess or infection  Psychiatric: She has a normal mood and affect.    ED Course  Procedures (including critical care time)  DIAGNOSTIC STUDIES: Oxygen Saturation is 95% on room air, adequate by my interpretation.    COORDINATION OF CARE: 8:16 PM Discussed treatment plan with pt at bedside and pt agreed to plan.    Labs Reviewed - No data to display No results found.   1. Cellulitis of hand, left       MDM   Pt unsure where lesions on wrist or back came from.  No new soaps, laundry detergent, lotion, or other personal care products.  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  Areas are hard and indurated without obvious fluctuance or infection.  Prior episode of this well controlled with bactrim- will give rx.  Pt is allergic to benadryl, will given steroids to aid the inflammation.  Return precautions advised.     I personally performed the services described in this documentation, which was scribed in my presence. The recorded information has been reviewed and is accurate.    Garlon Hatchet, PA-C 06/22/12 1601

## 2012-06-21 NOTE — ED Notes (Signed)
Face swelling, big bump on lt. Ant. Forearm/wrist, and x 2 bumps on back and warmth.

## 2012-06-22 NOTE — ED Provider Notes (Signed)
Medical screening examination/treatment/procedure(s) were performed by non-physician practitioner and as supervising physician I was immediately available for consultation/collaboration.   Braelynn Benning J. Acelin Ferdig, MD 06/22/12 1607 

## 2012-07-10 ENCOUNTER — Emergency Department (HOSPITAL_COMMUNITY)
Admission: EM | Admit: 2012-07-10 | Discharge: 2012-07-10 | Disposition: A | Discharge status: Home / Self Care | Payer: BC Managed Care – PPO | Attending: Emergency Medicine | Admitting: Emergency Medicine

## 2012-07-10 ENCOUNTER — Encounter (HOSPITAL_COMMUNITY): Payer: Self-pay

## 2012-07-10 DIAGNOSIS — R42 Dizziness and giddiness: Secondary | ICD-10-CM | POA: Insufficient documentation

## 2012-07-10 DIAGNOSIS — L03019 Cellulitis of unspecified finger: Secondary | ICD-10-CM | POA: Insufficient documentation

## 2012-07-10 DIAGNOSIS — J45909 Unspecified asthma, uncomplicated: Secondary | ICD-10-CM | POA: Insufficient documentation

## 2012-07-10 DIAGNOSIS — L0390 Cellulitis, unspecified: Secondary | ICD-10-CM

## 2012-07-10 DIAGNOSIS — H01009 Unspecified blepharitis unspecified eye, unspecified eyelid: Secondary | ICD-10-CM | POA: Insufficient documentation

## 2012-07-10 DIAGNOSIS — Z79899 Other long term (current) drug therapy: Secondary | ICD-10-CM | POA: Insufficient documentation

## 2012-07-10 DIAGNOSIS — H01004 Unspecified blepharitis left upper eyelid: Secondary | ICD-10-CM

## 2012-07-10 DIAGNOSIS — L02519 Cutaneous abscess of unspecified hand: Secondary | ICD-10-CM | POA: Insufficient documentation

## 2012-07-10 DIAGNOSIS — R209 Unspecified disturbances of skin sensation: Secondary | ICD-10-CM | POA: Insufficient documentation

## 2012-07-10 MED ORDER — CEPHALEXIN 500 MG PO CAPS: 500.0000 mg | ORAL_CAPSULE | Freq: Four times a day (QID) | ORAL | Status: DC

## 2012-07-10 MED ORDER — TOBRAMYCIN-DEXAMETHASONE 0.3-0.1 % OP SUSP: 1.0000 [drp] | OPHTHALMIC | Status: DC

## 2012-07-10 MED ORDER — SULFAMETHOXAZOLE-TRIMETHOPRIM 800-160 MG PO TABS: 1.0000 | ORAL_TABLET | Freq: Two times a day (BID) | ORAL | Status: DC

## 2012-07-10 MED ORDER — CEPHALEXIN 250 MG PO CAPS
1000.0000 mg | ORAL_CAPSULE | Freq: Once | ORAL | Status: AC
  Administered 2012-07-10: 1000 mg via ORAL
  Filled 2012-07-10: qty 4

## 2012-07-10 MED ORDER — IBUPROFEN 400 MG PO TABS
400.0000 mg | ORAL_TABLET | Freq: Once | ORAL | Status: AC
  Administered 2012-07-10: 400 mg via ORAL
  Filled 2012-07-10: qty 1

## 2012-07-10 MED ORDER — SULFAMETHOXAZOLE-TMP DS 800-160 MG PO TABS
1.0000 | ORAL_TABLET | Freq: Once | ORAL | Status: AC
  Administered 2012-07-10: 1 via ORAL
  Filled 2012-07-10: qty 1

## 2012-07-10 MED ORDER — TRAMADOL HCL 50 MG PO TABS: 50.0000 mg | ORAL_TABLET | Freq: Four times a day (QID) | ORAL | Status: DC | PRN

## 2012-07-10 NOTE — ED Notes (Signed)
Pt c/o waking this am with swelling to her (R) middle finger and (L) eye swelling

## 2012-07-10 NOTE — ED Provider Notes (Signed)
History     CSN: [id number]  Arrival date & time 07/10/12  4098   First MD Initiated Contact with Patient 07/10/12 [phone]      Chief Complaint  Patient presents with  . Finger Injury  . Eye Pain    (Consider location/radiation/quality/duration/timing/severity/associated sxs/prior treatment) HPI   Felicia Salinas is a 22 y.o. female complaining of swelling to right middle finger for one day and left eyelid swelling starting this a.m. She denies trauma, fever, change in vision, pain with eye movement, change in visual acuity. She states that the pain in her finger is a constant dull ache rated at 5/10, associated with a tingling paresthesia. 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 Patient denies recent eye trauma, the medication, blurred vision, decreased visual acuity, crusting of the eyes or pain patient tried warm compresses with no relief. Patient she has been intermittently lightheaded for the last 3 days episodes last approximately 30 minutes in which case she feels as if she is hyperventilating and associated with seeing spots" vision and "tracers." Last episode was yesterday.   Past Medical History  Diagnosis Date  . Environmental allergies   . Asthma     controlled per patient last attack in January    Past Surgical History  Procedure Laterality Date  . Tonsillectomy    . Wisdom tooth extraction    . Cholecystectomy  10/18/2011    Procedure: LAPAROSCOPIC CHOLECYSTECTOMY WITH INTRAOPERATIVE CHOLANGIOGRAM;  Surgeon: Emelia Loron, MD;  Location: [facility] OR;  Service: General;  Laterality: N/A;  . Appendectomy  04/07/2011    Family History  Problem Relation Age of Onset  . Breast cancer Maternal Grandmother   . Ovarian cancer Paternal Aunt   . Ovarian cancer Maternal Aunt   . Diabetes Sister   . Diabetes Paternal Grandfather   . Diabetes Maternal Grandmother   . Hypertension Maternal Grandfather   . Colon cancer Neg Hx     History    Substance Use Topics  . Smoking status: Never Smoker   . Smokeless tobacco: Never Used  . Alcohol Use: Yes     Comment: Occasional    OB History   Grav Para Term Preterm Abortions TAB SAB Ect Mult Living                  Review of Systems  Constitutional: Negative for fever.  Eyes: Negative for photophobia, pain, discharge, redness, itching and visual disturbance.  Respiratory: Negative for shortness of breath.   Cardiovascular: Negative for chest pain.  Gastrointestinal: Negative for nausea, vomiting, abdominal pain and diarrhea.  Musculoskeletal: Positive for joint swelling and arthralgias.  All other systems reviewed and are negative.    Allergies  Benadryl; Other; Eggs or egg-derived products; and Tape  Home Medications   Current Outpatient Rx  Name  Route  Sig  Dispense  Refill  . acetaminophen (TYLENOL) 500 MG tablet   Oral   Take 500 mg by mouth every 6 (six) hours as needed for pain.         Marland Kitchen albuterol (PROVENTIL HFA;VENTOLIN HFA) 108 (90 BASE) MCG/ACT inhaler   Inhalation   Inhale 2 puffs into the lungs every 4 (four) hours as needed. For shortness of breath         . fexofenadine (ALLEGRA) 30 MG tablet   Oral   Take 30 mg by mouth daily.         Marland Kitchen ibuprofen (ADVIL,MOTRIN) 200 MG tablet  Oral   Take 800 mg by mouth every 6 (six) hours as needed. For pain           BP 108/66  Pulse 83  Temp(Src) 98.1 F (36.7 C) (Oral)  Resp 18  SpO2 100%  LMP 06/28/2012  Physical Exam  Nursing note and vitals reviewed. Constitutional: She is oriented to person, place, and time. She appears well-developed and well-nourished. No distress.  HENT:  Head: Normocephalic.  Mouth/Throat: Oropharynx is clear and moist.  Eyes: Conjunctivae and EOM are normal. Pupils are equal, round, and reactive to light.  No conjunctival injection, no discharge, extraocular movements intact with no pain or diplopia. Mild swelling to left upper eyelid with mild erythema and  tenderness to palpation, no induration. Patient also has scabs above left eyebrow.   Neck: Normal range of motion.  Cardiovascular: Normal rate.   Pulmonary/Chest: Effort normal and breath sounds normal. No stridor. No respiratory distress. She has no wheezes. She has no rales. She exhibits no tenderness.  Abdominal: Soft. Bowel sounds are normal. She exhibits no distension. There is no tenderness. There is no rebound.  Musculoskeletal: Normal range of motion.  Swelling to right third digit proximal phalanx is more swollen than others, there is diffuse tenderness to palpation especially on the dorsum (extensor surface) of the proximal phalanx, patient can fully extend the finger with no tenderness to palpation, there is no tenderness to palpation along the flexor tendon sheath.  Neurological: She is alert and oriented to person, place, and time.  Psychiatric: She has a normal mood and affect.          ED Course  Procedures (including critical care time)  Labs Reviewed - No data to display No results found.   1. Cellulitis   2. Blepharitis of left upper eyelid       MDM   Felicia Salinas is a 22 y.o. female right middle finger pain and swelling, and no trauma. Patient has swollen proximal right third digit. Tenderness to palpation seems to be centered on the extensor side on the proximal phalanx. There is a trace warmth to the area. Patient is able to fully extend the digit with no tenderness to palpation of the flexor tendon sheath, I doubt tenosynovitis.    This is a shared visit with attending Dr. Manus Gunning, who has discussed the case with the hand surgeon Dr. Izora Ribas who doubts flexor tenosynovitis.    Filed Vitals:   07/10/12 0845 07/10/12 0900  BP: 108/66 101/65  Pulse: 83 71  Temp: 98.1 F (36.7 C)   TempSrc: Oral   Resp: 18   SpO2: 100% 100%     Pt verbalized understanding and agrees with care plan. Outpatient follow-up and return precautions given.    New  Prescriptions   CEPHALEXIN (KEFLEX) 500 MG CAPSULE    Take 1 capsule (500 mg total) by mouth 4 (four) times daily.   SULFAMETHOXAZOLE-TRIMETHOPRIM (SEPTRA DS) 800-160 MG PER TABLET    Take 1 tablet by mouth every 12 (twelve) hours.   TOBRAMYCIN-DEXAMETHASONE (TOBRADEX) OPHTHALMIC SOLUTION    Place 1 drop into the left eye every 4 (four) hours while awake.   TRAMADOL (ULTRAM) 50 MG TABLET    Take 1 tablet (50 mg total) by mouth every 6 (six) hours as needed for pain.           Wynetta Emery, PA-C 07/10/12 1147

## 2012-07-10 NOTE — ED Provider Notes (Signed)
Medical screening examination/treatment/procedure(s) were conducted as a shared visit with non-physician practitioner(s) and myself.  I personally evaluated the patient during the encounter  Atraumatic swelling of the proximal phalanx of the right middle finger. No fever. Full range of motion. No pain with passive extension. No pain along the flexor tendon sheath. D/w Dr. Izora Ribas.  Consistent with celluilitis, doubt tenosynovitis.  Recommends splint and antibiotics.  Glynn Octave, MD 07/10/12 [phone]

## 2012-07-10 NOTE — Progress Notes (Signed)
Orthopedic Tech Progress Note Patient Details:  Felicia Salinas 06-Sep-1990 [id number] Finger splint applied to 3rd digit on Right hand. Tolerated well.   Ortho Devices Type of Ortho Device: Finger splint Ortho Device/Splint Interventions: Application   [name] 07/10/2012, 12:09 PM

## 2012-07-23 ENCOUNTER — Other Ambulatory Visit: Payer: Self-pay | Admitting: General Surgery

## 2012-07-23 DIAGNOSIS — M7989 Other specified soft tissue disorders: Secondary | ICD-10-CM

## 2012-07-23 DIAGNOSIS — M79609 Pain in unspecified limb: Secondary | ICD-10-CM

## 2012-07-23 DIAGNOSIS — M65849 Other synovitis and tenosynovitis, unspecified hand: Secondary | ICD-10-CM

## 2012-07-23 DIAGNOSIS — M65839 Other synovitis and tenosynovitis, unspecified forearm: Secondary | ICD-10-CM

## 2012-07-29 ENCOUNTER — Ambulatory Visit
Admission: RE | Admit: 2012-07-29 | Discharge: 2012-07-29 | Disposition: A | Discharge status: Home / Self Care | Payer: BC Managed Care – PPO | Source: Ambulatory Visit | Attending: General Surgery | Admitting: General Surgery

## 2012-07-29 DIAGNOSIS — M79609 Pain in unspecified limb: Secondary | ICD-10-CM

## 2012-07-29 DIAGNOSIS — M65839 Other synovitis and tenosynovitis, unspecified forearm: Secondary | ICD-10-CM

## 2012-07-29 DIAGNOSIS — M7989 Other specified soft tissue disorders: Secondary | ICD-10-CM

## 2012-07-29 DIAGNOSIS — M65849 Other synovitis and tenosynovitis, unspecified hand: Secondary | ICD-10-CM

## 2012-08-07 ENCOUNTER — Encounter (HOSPITAL_COMMUNITY): Payer: Self-pay | Admitting: Emergency Medicine

## 2012-08-07 ENCOUNTER — Emergency Department (HOSPITAL_COMMUNITY)
Admission: EM | Admit: 2012-08-07 | Discharge: 2012-08-07 | Disposition: A | Discharge status: Home / Self Care | Payer: BC Managed Care – PPO | Attending: Emergency Medicine | Admitting: Emergency Medicine

## 2012-08-07 ENCOUNTER — Emergency Department (HOSPITAL_COMMUNITY): Payer: BC Managed Care – PPO

## 2012-08-07 DIAGNOSIS — J069 Acute upper respiratory infection, unspecified: Secondary | ICD-10-CM | POA: Insufficient documentation

## 2012-08-07 DIAGNOSIS — J45909 Unspecified asthma, uncomplicated: Secondary | ICD-10-CM | POA: Insufficient documentation

## 2012-08-07 DIAGNOSIS — Z79899 Other long term (current) drug therapy: Secondary | ICD-10-CM | POA: Insufficient documentation

## 2012-08-07 DIAGNOSIS — R0602 Shortness of breath: Secondary | ICD-10-CM | POA: Insufficient documentation

## 2012-08-07 DIAGNOSIS — Z8709 Personal history of other diseases of the respiratory system: Secondary | ICD-10-CM | POA: Insufficient documentation

## 2012-08-07 DIAGNOSIS — R0982 Postnasal drip: Secondary | ICD-10-CM | POA: Insufficient documentation

## 2012-08-07 MED ORDER — BENZONATATE 100 MG PO CAPS: 100.0000 mg | ORAL_CAPSULE | Freq: Three times a day (TID) | ORAL | Status: DC

## 2012-08-07 NOTE — ED Notes (Signed)
Chest congestion, cough, sore throat x 2 weeks. Thought it was her "allergies, but it didn't go away". Pt is in no distress.

## 2012-08-07 NOTE — ED Provider Notes (Signed)
Medical screening examination/treatment/procedure(s) were performed by non-physician practitioner and as supervising physician I was immediately available for consultation/collaboration.   Carleene Cooper III, MD 08/07/12 [phone]

## 2012-08-07 NOTE — ED Provider Notes (Signed)
History     CSN: [id number]  Arrival date & time 08/07/12  1041   First MD Initiated Contact with Patient 08/07/12 1057      Chief Complaint  Patient presents with  . URI    (Consider location/radiation/quality/duration/timing/severity/associated sxs/prior treatment) HPI Comments: Patient presents with a chief complaint of cough.  She reports that she has had a productive cough for the past 2.5 weeks.  She is unsure if she has had a fever.  She denies shortness of breath.  She does have some chest pain, but only with coughing.  She has taken Mucinex with mild relief of symptoms.  PMH significant for Asthma and Seasonal Allergies.  Patient is a 22 y.o. female presenting with URI.  URI Presenting symptoms: congestion and cough   Presenting symptoms: no ear pain, no fever and no sore throat   Progression:  Worsening Associated symptoms: no neck pain, no sinus pain and no wheezing   Risk factors: no sick contacts     Past Medical History  Diagnosis Date  . Environmental allergies   . Asthma     controlled per patient last attack in January    Past Surgical History  Procedure Laterality Date  . Tonsillectomy    . Wisdom tooth extraction    . Appendectomy  04/07/2011  . Cholecystectomy  10/18/2011    Procedure: LAPAROSCOPIC CHOLECYSTECTOMY WITH INTRAOPERATIVE CHOLANGIOGRAM;  Surgeon: Emelia Loron, MD;  Location: [facility] OR;  Service: General;  Laterality: N/A;    Family History  Problem Relation Age of Onset  . Breast cancer Maternal Grandmother   . Diabetes Maternal Grandmother   . Rheum arthritis Maternal Grandmother   . Ovarian cancer Paternal Aunt   . Ovarian cancer Maternal Aunt   . Diabetes Sister   . Diabetes Paternal Grandfather   . Hypertension Maternal Grandfather   . Colon cancer Neg Hx     History  Substance Use Topics  . Smoking status: Never Smoker   . Smokeless tobacco: Never Used  . Alcohol Use: Yes     Comment: Occasional    OB History   Grav  Para Term Preterm Abortions TAB SAB Ect Mult Living                  Review of Systems  Constitutional: Negative for fever and chills.       Subjective fever  HENT: Positive for congestion and postnasal drip. Negative for ear pain, sore throat, neck pain, neck stiffness and sinus pressure.   Respiratory: Positive for cough and shortness of breath. Negative for wheezing.   All other systems reviewed and are negative.    Allergies  Benadryl; Other; Eggs or egg-derived products; and Tape  Home Medications   Current Outpatient Rx  Name  Route  Sig  Dispense  Refill  . albuterol (PROVENTIL HFA;VENTOLIN HFA) 108 (90 BASE) MCG/ACT inhaler   Inhalation   Inhale 2 puffs into the lungs every 4 (four) hours as needed. For shortness of breath           BP 109/71  Pulse 76  Temp(Src) 98.1 F (36.7 C) (Oral)  Resp 16  Ht 5\' 5"  (1.651 m)  Wt 220 lb (99.791 kg)  BMI 36.61 kg/m2  SpO2 98%  LMP 07/28/2012  Physical Exam  Nursing note and vitals reviewed. Constitutional: She appears well-developed and well-nourished. No distress.  HENT:  Head: Normocephalic and atraumatic.  Right Ear: Tympanic membrane and ear canal normal.  Left Ear: Tympanic membrane and  ear canal normal.  Nose: Nose normal. Right sinus exhibits no maxillary sinus tenderness and no frontal sinus tenderness. Left sinus exhibits no maxillary sinus tenderness and no frontal sinus tenderness.  Mouth/Throat: Uvula is midline, oropharynx is clear and moist and mucous membranes are normal.     Neck: Normal range of motion. Neck supple.  Cardiovascular: Normal rate, regular rhythm and normal heart sounds.   Pulmonary/Chest: Effort normal and breath sounds normal. No respiratory distress. She has no wheezes. She has no rales.  Neurological: She is alert.  Skin: Skin is warm and dry. No rash noted. She is not diaphoretic.  Psychiatric: She has a normal mood and affect.    ED Course  Procedures (including critical  care time)  Labs Reviewed - No data to display Dg Chest 2 View  08/07/2012   *RADIOLOGY REPORT*  Clinical Data: History of chest pain and coughing.  History of asthma.  History of shortness of breath.  CHEST - 2 VIEW  Comparison: 01/22/2007.  Findings: Stable normal size and shape of the cardiac silhouette is seen.  Stable normal appearance and contours of hilar or mediastinal regions are seen.  No pulmonary infiltrates or masses are seen. No pleural abnormality is evident. Bones appear average for age.  IMPRESSION: No acute or active cardiopulmonary disease is evident.   Original Report Authenticated By: Onalee Hua Call     No diagnosis found.    MDM  Pt CXR negative for acute infiltrate. Patients symptoms are consistent with URI, likely viral etiology. Discussed that antibiotics are not indicated for viral infections. Pt will be discharged with symptomatic treatment.  Verbalizes understanding and is agreeable with plan. Pt is hemodynamically stable & in NAD prior to dc.        Pascal Lux Frisbee, PA-C 08/07/12 [phone]

## 2012-08-07 NOTE — ED Notes (Signed)
Pt states she has had a cough for the past week and a half. Pt states she has headaches on and off throughout the past week and a half. Pt states she is coughing up yellow-green sputum, pt states she has a running nose and is also blowing out yellow-green sputum. Pt states she has chest pain and back pain from coughing so much. Pt is not able to sleep through the night. Pt denies any fever.

## 2012-11-04 ENCOUNTER — Emergency Department (HOSPITAL_COMMUNITY)
Admission: EM | Admit: 2012-11-04 | Discharge: 2012-11-05 | Disposition: A | Discharge status: Home / Self Care | Payer: BC Managed Care – PPO | Attending: Emergency Medicine | Admitting: Emergency Medicine

## 2012-11-04 ENCOUNTER — Encounter (HOSPITAL_COMMUNITY): Payer: Self-pay | Admitting: *Deleted

## 2012-11-04 DIAGNOSIS — R109 Unspecified abdominal pain: Secondary | ICD-10-CM

## 2012-11-04 DIAGNOSIS — Z79899 Other long term (current) drug therapy: Secondary | ICD-10-CM | POA: Insufficient documentation

## 2012-11-04 DIAGNOSIS — Z3202 Encounter for pregnancy test, result negative: Secondary | ICD-10-CM | POA: Insufficient documentation

## 2012-11-04 DIAGNOSIS — R110 Nausea: Secondary | ICD-10-CM | POA: Insufficient documentation

## 2012-11-04 DIAGNOSIS — J45909 Unspecified asthma, uncomplicated: Secondary | ICD-10-CM | POA: Insufficient documentation

## 2012-11-04 DIAGNOSIS — R1084 Generalized abdominal pain: Secondary | ICD-10-CM | POA: Insufficient documentation

## 2012-11-04 LAB — CBC WITH DIFFERENTIAL/PLATELET
Basophils Absolute: 0 10*3/uL (ref 0.0–0.1)
HCT: 36.5 % (ref 36.0–46.0)
Hemoglobin: 12.9 g/dL (ref 12.0–15.0)
Lymphocytes Relative: 32 % (ref 12–46)
Monocytes Absolute: 0.9 10*3/uL (ref 0.1–1.0)
Monocytes Relative: 11 % (ref 3–12)
Neutro Abs: 3.8 10*3/uL (ref 1.7–7.7)
RDW: 12.2 % (ref 11.5–15.5)
WBC: 7.8 10*3/uL (ref 4.0–10.5)

## 2012-11-04 LAB — COMPREHENSIVE METABOLIC PANEL
ALT: 14 U/L (ref 0–35)
AST: 15 U/L (ref 0–37)
Alkaline Phosphatase: 59 U/L (ref 39–117)
CO2: 30 mEq/L (ref 19–32)
Chloride: 101 mEq/L (ref 96–112)
Creatinine, Ser: 0.77 mg/dL (ref 0.50–1.10)
GFR calc non Af Amer: >90 mL/min (ref >90)
Potassium: 4 mEq/L (ref 3.5–5.1)
Total Bilirubin: 0.2 mg/dL — ABNORMAL LOW (ref 0.3–1.2)

## 2012-11-04 LAB — URINALYSIS, ROUTINE W REFLEX MICROSCOPIC
Bilirubin Urine: NEGATIVE
Glucose, UA: NEGATIVE mg/dL
Hgb urine dipstick: NEGATIVE
Ketones, ur: NEGATIVE mg/dL
Protein, ur: NEGATIVE mg/dL

## 2012-11-04 NOTE — ED Notes (Signed)
Mid abd pain for 4 days with nausea.  lmp   aug

## 2012-11-04 NOTE — ED Notes (Signed)
The pt reports that her bp is usually in the 90s

## 2012-11-05 ENCOUNTER — Emergency Department (HOSPITAL_COMMUNITY): Payer: BC Managed Care – PPO

## 2012-11-05 MED ORDER — KETOROLAC TROMETHAMINE 60 MG/2ML IM SOLN
60.0000 mg | Freq: Once | INTRAMUSCULAR | Status: AC
  Administered 2012-11-05: 60 mg via INTRAMUSCULAR
  Filled 2012-11-05: qty 2

## 2012-11-05 MED ORDER — HYDROCODONE-ACETAMINOPHEN 5-325 MG PO TABS: 1.0000 | ORAL_TABLET | Freq: Four times a day (QID) | ORAL | Status: DC | PRN

## 2012-11-05 NOTE — ED Provider Notes (Signed)
CSN: [id number]     Arrival date & time 11/04/12  2003 History  This chart was scribed for Jovanni Rash Smitty Cords, MD by Greggory Stallion, ED Scribe. This patient was seen in room A05C/A05C and the patient's care was started at 12:00 AM.   Chief Complaint  Patient presents with  . Abdominal Salinas   Patient is a 22 y.o. female presenting with abdominal Salinas. The history is provided by the patient. No language interpreter was used.  Abdominal Salinas Salinas location:  Generalized Salinas quality: aching   Salinas radiates to:  Does not radiate Salinas severity:  Moderate Onset quality:  Gradual Duration:  5 days Timing:  Constant Progression:  Unchanged Chronicity:  New Context: not alcohol use, not retching and not sick contacts   Relieved by:  Nothing Associated symptoms: nausea   Associated symptoms: no anorexia, no dysuria, no fever, no vaginal bleeding and no vaginal discharge   Risk factors: no recent hospitalization     HPI Comments: Felicia Salinas is a 22 y.o. female who presents to the Emergency Department complaining of gradual onset, constant abdominal Salinas that started 4 days ago. Certain movements worsen the Salinas. She has taken tylenol and ibuprofen with some relief. Pt denies fever, vaginal discharge, vaginal bleeding and dysuria as associated symptoms. Appendectomy in January 2012 and cholecystectomy in July 2012.   Past Medical History  Diagnosis Date  . Environmental allergies   . Asthma     controlled per patient last attack in January   Past Surgical History  Procedure Laterality Date  . Tonsillectomy    . Wisdom tooth extraction    . Appendectomy  04/07/2011  . Cholecystectomy  10/18/2011    Procedure: LAPAROSCOPIC CHOLECYSTECTOMY WITH INTRAOPERATIVE CHOLANGIOGRAM;  Surgeon: Emelia Loron, MD;  Location: [facility] OR;  Service: General;  Laterality: N/A;   Family History  Problem Relation Age of Onset  . Breast cancer Maternal Grandmother   . Diabetes Maternal Grandmother    . Rheum arthritis Maternal Grandmother   . Ovarian cancer Paternal Aunt   . Ovarian cancer Maternal Aunt   . Diabetes Sister   . Diabetes Paternal Grandfather   . Hypertension Maternal Grandfather   . Colon cancer Neg Hx    History  Substance Use Topics  . Smoking status: Never Smoker   . Smokeless tobacco: Never Used  . Alcohol Use: Yes     Comment: Occasional   OB History   Grav Para Term Preterm Abortions TAB SAB Ect Mult Living                 Review of Systems  Constitutional: Negative for fever.  Gastrointestinal: Positive for nausea and abdominal Salinas. Negative for anorexia.  Genitourinary: Negative for dysuria, vaginal bleeding and vaginal discharge.  All other systems reviewed and are negative.    Allergies  Benadryl; Other; Eggs or egg-derived products; and Tape  Home Medications   Current Outpatient Rx  Name  Route  Sig  Dispense  Refill  . albuterol (PROVENTIL HFA;VENTOLIN HFA) 108 (90 BASE) MCG/ACT inhaler   Inhalation   Inhale 2 puffs into the lungs every 4 (four) hours as needed. For shortness of breath         . PRESCRIPTION MEDICATION   Oral   Take 1 tablet by mouth daily.          BP 98/61  Pulse 80  Temp(Src) 98.7 F (37.1 C)  Resp 20  SpO2 98%  LMP 10/27/2012  Physical Exam  Nursing note and vitals reviewed. Constitutional: She is oriented to person, place, and time. She appears well-developed and well-nourished. No distress.  HENT:  Head: Normocephalic and atraumatic.  Right Ear: External ear normal.  Left Ear: External ear normal.  Nose: Nose normal.  Mouth/Throat: Oropharynx is clear and moist.  Moist mucous membranes.   Eyes: Conjunctivae and EOM are normal. Pupils are equal, round, and reactive to light. Right eye exhibits no discharge. Left eye exhibits no discharge.  Neck: Normal range of motion. Neck supple. No tracheal deviation present.  Cardiovascular: Normal rate, regular rhythm and normal heart sounds.   No murmur  heard. Pulmonary/Chest: Effort normal and breath sounds normal. No respiratory distress. She has no wheezes. She has no rales.  Abdominal: Soft. Bowel sounds are normal. She exhibits no distension and no mass. There is no tenderness. There is no rebound and no guarding.  Musculoskeletal: Normal range of motion.  Neurological: She is alert and oriented to person, place, and time. She has normal reflexes.  Skin: Skin is warm and dry. She is not diaphoretic.  Psychiatric: She has a normal mood and affect. Her behavior is normal.    ED Course   Procedures (including critical care time)  DIAGNOSTIC STUDIES: Oxygen Saturation is 98% on RA, normal by my interpretation.    COORDINATION OF CARE: 12:06 AM-Discussed treatment plan which includes Salinas medication and acute abdominal series with pt at bedside and pt agreed to plan.   Labs Reviewed  CBC WITH DIFFERENTIAL - Abnormal; Notable for the following:    Eosinophils Relative 8 (*)    All other components within normal limits  COMPREHENSIVE METABOLIC PANEL - Abnormal; Notable for the following:    Total Bilirubin 0.2 (*)    All other components within normal limits  URINALYSIS, ROUTINE W REFLEX MICROSCOPIC - Abnormal; Notable for the following:    Specific Gravity, Urine 1.002 (*)    All other components within normal limits  LIPASE, BLOOD  POCT PREGNANCY, URINE   No results found. No diagnosis found.  MDM  Likely abdominal Salinas Salinas no advanced imaging needed follow up with your family doctor for ongoing care      Medical screening examination/treatment/procedure(s) were performed by non-physician practitioner and as supervising physician I was immediately available for consultation/collaboration.   Jasmine Awe, MD 11/05/12 [phone]

## 2013-02-03 ENCOUNTER — Emergency Department (HOSPITAL_COMMUNITY)
Admission: EM | Admit: 2013-02-03 | Discharge: 2013-02-04 | Disposition: A | Discharge status: Home / Self Care | Payer: BC Managed Care – PPO | Attending: Emergency Medicine | Admitting: Emergency Medicine

## 2013-02-03 ENCOUNTER — Encounter (HOSPITAL_COMMUNITY): Payer: Self-pay | Admitting: Emergency Medicine

## 2013-02-03 DIAGNOSIS — J45901 Unspecified asthma with (acute) exacerbation: Secondary | ICD-10-CM | POA: Insufficient documentation

## 2013-02-03 DIAGNOSIS — L509 Urticaria, unspecified: Secondary | ICD-10-CM | POA: Insufficient documentation

## 2013-02-03 DIAGNOSIS — E669 Obesity, unspecified: Secondary | ICD-10-CM | POA: Insufficient documentation

## 2013-02-03 DIAGNOSIS — Z79899 Other long term (current) drug therapy: Secondary | ICD-10-CM | POA: Insufficient documentation

## 2013-02-03 HISTORY — DX: Obesity, unspecified: E66.9

## 2013-02-03 MED ORDER — PREDNISONE 20 MG PO TABS: ORAL_TABLET | ORAL | Status: DC

## 2013-02-03 MED ORDER — PREDNISONE 20 MG PO TABS
60.0000 mg | ORAL_TABLET | Freq: Once | ORAL | Status: AC
  Administered 2013-02-04: 60 mg via ORAL
  Filled 2013-02-03: qty 3

## 2013-02-03 MED ORDER — HYDROCORTISONE 2.5 % EX LOTN
TOPICAL_LOTION | Freq: Two times a day (BID) | CUTANEOUS | Status: DC
Start: 2013-02-03 — End: 2013-10-02

## 2013-02-03 NOTE — ED Notes (Signed)
Patient has been seen for the same.  Has been taking Montelukast and Clemastine Fumarate for her rash.  This medicine has not been working and the ras has come back.  Rash on her arms, abd, under her arms.  No new clothing, soaps, foods, detergents.

## 2013-02-03 NOTE — ED Notes (Signed)
Pt. reports persistent generalized itchy rashes for 1 month unrelieved by prescription medications prescribed by her allergy MD ( Dr. Sharyn Lull). Respirations unlabored / airway intact.

## 2013-02-03 NOTE — ED Provider Notes (Signed)
CSN: [id number]     Arrival date & time 02/03/13  2145 History   First MD Initiated Contact with Patient 02/03/13 2236    This chart was scribed for Dahlia Client Muthersbaug PA-C, a non-physician practitioner working with No att. providers found by Lewanda Rife, ED Scribe. This patient was seen in room TR07C/TR07C and the patient's care was started at 12:35 AM     Chief Complaint  Patient presents with  . Rash   (Consider location/radiation/quality/duration/timing/severity/associated sxs/prior Treatment) The history is provided by the patient and medical records. No language interpreter was used.   HPI Comments: Felicia Salinas is a 22 y.o. female who presents to the Emergency Department complaining of waxing and waning diffuse rash onset 1-2 months. Describes rash as pruritic. She reports it is most commonly on her arms and chest, but tonight she also has hives on her abd. Reports when rash appears it disappears and reappears in other places. Reports associated mild shortness of breath she attributes to her asthma, but which is not present tonight. Denies associated tongue swelling, lip swelling, and dysphagia. Reports PMHx of multiple allergies including benadryl and environmental (tree, grass, dust, pollen, mold). Denies recent sick contacts or other household members with the same rash. Reports trying permethrin cream with no relief of symptoms. States Dr. Laurette Schimke is her allergist and after extensive testing they will begin allergy shots soon.  She is also taking Monoleukast and Clemastine Fumarate for this. She reports that she came to the ER tonight because she is itching.      Past Medical History  Diagnosis Date  . Environmental allergies   . Asthma     controlled per patient last attack in January  . Obesity    Past Surgical History  Procedure Laterality Date  . Tonsillectomy    . Wisdom tooth extraction    . Appendectomy  04/07/2011  . Cholecystectomy  10/18/2011     Procedure: LAPAROSCOPIC CHOLECYSTECTOMY WITH INTRAOPERATIVE CHOLANGIOGRAM;  Surgeon: Emelia Loron, MD;  Location: [facility] OR;  Service: General;  Laterality: N/A;   Family History  Problem Relation Age of Onset  . Breast cancer Maternal Grandmother   . Diabetes Maternal Grandmother   . Rheum arthritis Maternal Grandmother   . Ovarian cancer Paternal Aunt   . Ovarian cancer Maternal Aunt   . Diabetes Sister   . Diabetes Paternal Grandfather   . Hypertension Maternal Grandfather   . Colon cancer Neg Hx    History  Substance Use Topics  . Smoking status: Never Smoker   . Smokeless tobacco: Never Used  . Alcohol Use: Yes     Comment: Occasional   OB History   Grav Para Term Preterm Abortions TAB SAB Ect Mult Living                 Review of Systems  Constitutional: Negative for fever, diaphoresis, appetite change, fatigue and unexpected weight change.  HENT: Negative for mouth sores.   Eyes: Negative for visual disturbance.  Respiratory: Positive for shortness of breath and wheezing. Negative for cough and chest tightness.   Cardiovascular: Negative for chest pain.  Gastrointestinal: Negative for nausea, vomiting, abdominal pain, diarrhea and constipation.  Endocrine: Negative for polydipsia, polyphagia and polyuria.  Genitourinary: Negative for dysuria, urgency, frequency and hematuria.  Musculoskeletal: Negative for back pain and neck stiffness.  Skin: Positive for rash.  Allergic/Immunologic: Negative for immunocompromised state.  Neurological: Negative for syncope, light-headedness and headaches.  Hematological: Does not bruise/bleed easily.  Psychiatric/Behavioral: Negative  for sleep disturbance. The patient is not nervous/anxious.   All other systems reviewed and are negative.   A complete 10 system review of systems was obtained and all systems are negative except as noted in the HPI and PMHx.    Allergies  Benadryl; Other; Eggs or egg-derived products; and  Tape  Home Medications   Current Outpatient Rx  Name  Route  Sig  Dispense  Refill  . albuterol (PROVENTIL HFA;VENTOLIN HFA) 108 (90 BASE) MCG/ACT inhaler   Inhalation   Inhale 2 puffs into the lungs every 4 (four) hours as needed. For shortness of breath         . PRESCRIPTION MEDICATION      Medication for allergies. Name unknown         . hydrocortisone 2.5 % lotion   Topical   Apply topically 2 (two) times daily.   59 mL   0   . predniSONE (DELTASONE) 20 MG tablet      3 tabs po daily x 3 days, then 2 tabs x 3 days, then 1.5 tabs x 3 days, then 1 tab x 3 days, then 0.5 tabs x 3 days   27 tablet   0    BP 150/70  Pulse 100  Temp(Src) 98.6 F (37 C) (Oral)  Resp 20  Wt 242 lb (109.77 kg)  SpO2 100%  LMP 01/12/2013 Physical Exam  Nursing note and vitals reviewed. Constitutional: She appears well-developed and well-nourished. No distress.  Awake, alert, nontoxic appearance  HENT:  Head: Normocephalic and atraumatic.  Right Ear: Tympanic membrane, external ear and ear canal normal.  Left Ear: Tympanic membrane, external ear and ear canal normal.  Nose: Nose normal. No mucosal edema.  Mouth/Throat: Uvula is midline, oropharynx is clear and moist and mucous membranes are normal. No trismus in the jaw. No uvula swelling. No oropharyngeal exudate, posterior oropharyngeal edema, posterior oropharyngeal erythema or tonsillar abscesses.  Handling oral secretions. Airway patent. No swelling of tongue or lips.    Eyes: Conjunctivae are normal. No scleral icterus.  Neck: Normal range of motion. Neck supple.  Cardiovascular: Normal rate, regular rhythm, normal heart sounds and intact distal pulses.   No murmur heard. Regular rate and rhythm, no tachycardia  Pulmonary/Chest: Effort normal and breath sounds normal. No stridor. No respiratory distress. She has no wheezes. She has no rales.  Clear and equal breath sounds, no wheezing or rhonchi  Abdominal: Soft. Bowel sounds  are normal. She exhibits no mass. There is no tenderness. There is no rebound and no guarding.  Musculoskeletal: Normal range of motion. She exhibits no edema.  Neurological: She is alert. She exhibits normal muscle tone. Coordination normal.  Speech is clear and goal oriented Moves extremities without ataxia  Skin: Skin is warm and dry. Rash noted. Rash is papular and urticarial. She is not diaphoretic. There is erythema.  Scattered papules with urticarial rash on arms, chest, abdomen Some hyperkeratotic excoriated patches mainly on the extensor regions of the upper extremities  Psychiatric: She has a normal mood and affect. Her behavior is normal.    ED Course  Procedures (including critical care time)   Treatment plan initiated: Medications  predniSONE (DELTASONE) tablet 60 mg (60 mg Oral Given 02/04/13 0013)     Initial diagnostic testing ordered.    Labs Review Labs Reviewed - No data to display Imaging Review No results found.  EKG Interpretation   None       MDM   1. Urticaria  Felicia Salinas presents with mild allergic reaction. Rash on chest, abdomen consistent with urticaria. Hyperkeratotic regions on the extensor regions of the upper extremities consistent with eczema. Patient with history of significant allergies and asthma.  Patient is alert, oriented, patent airway, no stridor, handling secretions without swelling of the oropharynx. Question possible contact dermatitis versus persistent allergic reaction.  Patient is seeing an allergist for this and will soon be receiving shots. Until that time patient counseled on care for eczema. Will prescribe hydrocortisone lotion and prednisone taper.  Patient re-evaluated prior to dc, is hemodynamically stable, in no respiratory distress, and denies the feeling of throat closing. Pt has been advised to take medications & return to the ED if they have a mod-severe allergic rxn (s/s including throat closing, difficulty  breathing, swelling of lips face or tongue). Pt is to follow up with their PCP. Pt is agreeable with plan & verbalizes understanding.  It has been determined that no acute conditions requiring further emergency intervention are present at this time. The patient/guardian have been advised of the diagnosis and plan. We have discussed signs and symptoms that warrant return to the ED, such as changes or worsening in symptoms.   Vital signs are stable at discharge.   BP 150/70  Temp(Src) 98.6 F (37 C) (Oral)  Resp 20  Wt 242 lb (109.77 kg)  SpO2 100%  LMP 01/12/2013  Patient/guardian has voiced understanding and agreed to follow-up with the PCP or specialist.  I personally performed the services described in this documentation, which was scribed in my presence. The recorded information has been reviewed and is accurate.    Dahlia Client Kao Berkheimer, PA-C 02/04/13 0036

## 2013-02-04 NOTE — ED Provider Notes (Signed)
Medical screening examination/treatment/procedure(s) were performed by non-physician practitioner and as supervising physician I was immediately available for consultation/collaboration.  EKG Interpretation   None         Enid Skeens, MD 02/04/13 0110

## 2013-10-02 ENCOUNTER — Emergency Department (HOSPITAL_COMMUNITY): Payer: BC Managed Care – PPO

## 2013-10-02 ENCOUNTER — Emergency Department (HOSPITAL_COMMUNITY)
Admission: EM | Admit: 2013-10-02 | Discharge: 2013-10-02 | Disposition: A | Discharge status: Home / Self Care | Payer: BC Managed Care – PPO | Attending: Emergency Medicine | Admitting: Emergency Medicine

## 2013-10-02 ENCOUNTER — Encounter (HOSPITAL_COMMUNITY): Payer: Self-pay | Admitting: Emergency Medicine

## 2013-10-02 DIAGNOSIS — Z3202 Encounter for pregnancy test, result negative: Secondary | ICD-10-CM | POA: Insufficient documentation

## 2013-10-02 DIAGNOSIS — Z79899 Other long term (current) drug therapy: Secondary | ICD-10-CM | POA: Insufficient documentation

## 2013-10-02 DIAGNOSIS — R109 Unspecified abdominal pain: Secondary | ICD-10-CM | POA: Insufficient documentation

## 2013-10-02 DIAGNOSIS — R110 Nausea: Secondary | ICD-10-CM | POA: Insufficient documentation

## 2013-10-02 DIAGNOSIS — J45909 Unspecified asthma, uncomplicated: Secondary | ICD-10-CM | POA: Insufficient documentation

## 2013-10-02 DIAGNOSIS — M7989 Other specified soft tissue disorders: Secondary | ICD-10-CM

## 2013-10-02 DIAGNOSIS — Z792 Long term (current) use of antibiotics: Secondary | ICD-10-CM | POA: Insufficient documentation

## 2013-10-02 DIAGNOSIS — E669 Obesity, unspecified: Secondary | ICD-10-CM | POA: Insufficient documentation

## 2013-10-02 DIAGNOSIS — N390 Urinary tract infection, site not specified: Secondary | ICD-10-CM

## 2013-10-02 DIAGNOSIS — R319 Hematuria, unspecified: Secondary | ICD-10-CM

## 2013-10-02 LAB — URINALYSIS, ROUTINE W REFLEX MICROSCOPIC
Bilirubin Urine: NEGATIVE
GLUCOSE, UA: NEGATIVE mg/dL
Ketones, ur: 15 mg/dL — AB
Nitrite: NEGATIVE
PH: 7.5 (ref 5.0–8.0)
PROTEIN: 100 mg/dL — AB
Specific Gravity, Urine: 1.016 (ref 1.005–1.030)
Urobilinogen, UA: 1 mg/dL (ref 0.0–1.0)

## 2013-10-02 LAB — I-STAT CHEM 8, ED
BUN: 7 mg/dL (ref 6–23)
CALCIUM ION: 1.2 mmol/L (ref 1.12–1.23)
CHLORIDE: 101 meq/L (ref 96–112)
CREATININE: 0.7 mg/dL (ref 0.50–1.10)
Glucose, Bld: 79 mg/dL (ref 70–99)
HCT: 40 % (ref 36.0–46.0)
Hemoglobin: 13.6 g/dL (ref 12.0–15.0)
Potassium: 3.8 mEq/L (ref 3.7–5.3)
SODIUM: 139 meq/L (ref 137–147)
TCO2: 28 mmol/L (ref 0–100)

## 2013-10-02 LAB — URINE MICROSCOPIC-ADD ON

## 2013-10-02 LAB — PRO B NATRIURETIC PEPTIDE: Pro B Natriuretic peptide (BNP): 29.6 pg/mL (ref 0–125)

## 2013-10-02 LAB — POC URINE PREG, ED: PREG TEST UR: NEGATIVE

## 2013-10-02 MED ORDER — CEPHALEXIN 500 MG PO CAPS: 500.0000 mg | ORAL_CAPSULE | Freq: Three times a day (TID) | ORAL | Status: DC

## 2013-10-02 NOTE — Discharge Instructions (Signed)
Please call your doctor for a followup appointment within 24-48 hours. When you talk to your doctor please let them know that you were seen in the emergency department and have them acquire all of your records so that they can discuss the findings with you and formulate a treatment plan to fully care for your new and ongoing problems. Please call and set up an appointment with women's outpatient clinic to be reassessed Please take antibiotics as prescribed for urinary tract infection Please drink plenty of water Please rest and elevate the legs-toes of nose Please decrease salt intake Please continue to monitor symptoms closely and if symptoms are to worsen or change (fever greater than 101, chills, chest pain, shortness of breath, difficulty breathing, numbness, tingling, worsening or changes to pain pattern, weakness, swelling worsens, changes to skin colored, ulcers, fall, injury) please report back to the ED immediately  Edema Edema is an abnormal buildup of fluids. It is more common in your legs and thighs. Painless swelling of the feet and ankles is more likely as a person ages. It also is common in looser skin, like around your eyes. HOME CARE   Keep the affected body part above the level of the heart while lying down.  Do not sit still or stand for a long time.  Do not put anything right under your knees when you lie down.  Do not wear tight clothes on your upper legs.  Exercise your legs to help the puffiness (swelling) go down.  Wear elastic bandages or support stockings as told by your doctor.  A low-salt diet may help lessen the puffiness.  Only take medicine as told by your doctor. GET HELP IF:  Treatment is not working.  You have heart, liver, or kidney disease and notice that your skin looks puffy or shiny.  You have puffiness in your legs that does not get better when you raise your legs.  You have sudden weight gain for no reason. GET HELP RIGHT AWAY IF:   You  have shortness of breath or chest pain.  You cannot breathe when you lie down.  You have pain, redness, or warmth in the areas that are puffy.  You have heart, liver, or kidney disease and get edema all of a sudden.  You have a fever and your symptoms get worse all of a sudden. MAKE SURE YOU:   Understand these instructions.  Will watch your condition.  Will get help right away if you are not doing well or get worse. Document Released: 08/29/2007 Document Revised: 03/17/2013 Document Reviewed: 01/02/2013 [facility] Patient Information 2015 [place], [place]. This information is not intended to replace advice given to you by your health care provider. Make sure you discuss any questions you have with your health care provider. Urinary Tract Infection Urinary tract infections (UTIs) can develop anywhere along your urinary tract. Your urinary tract is your body's drainage system for removing wastes and extra water. Your urinary tract includes two kidneys, two ureters, a bladder, and a urethra. Your kidneys are a pair of bean-shaped organs. Each kidney is about the size of your fist. They are located below your ribs, one on each side of your spine. CAUSES Infections are caused by microbes, which are microscopic organisms, including fungi, viruses, and bacteria. These organisms are so small that they can only be seen through a microscope. Bacteria are the microbes that most commonly cause UTIs. SYMPTOMS  Symptoms of UTIs may vary by age and gender of the patient and  by the location of the infection. Symptoms in young women typically include a frequent and intense urge to urinate and a painful, burning feeling in the bladder or urethra during urination. Older women and men are more likely to be tired, shaky, and weak and have muscle aches and abdominal pain. A fever may mean the infection is in your kidneys. Other symptoms of a kidney infection include pain in your back or sides below the ribs, nausea,  and vomiting. DIAGNOSIS To diagnose a UTI, your caregiver will ask you about your symptoms. Your caregiver also will ask to provide a urine sample. The urine sample will be tested for bacteria and white blood cells. White blood cells are made by your body to help fight infection. TREATMENT  Typically, UTIs can be treated with medication. Because most UTIs are caused by a bacterial infection, they usually can be treated with the use of antibiotics. The choice of antibiotic and length of treatment depend on your symptoms and the type of bacteria causing your infection. HOME CARE INSTRUCTIONS  If you were prescribed antibiotics, take them exactly as your caregiver instructs you. Finish the medication even if you feel better after you have only taken some of the medication.  Drink enough water and fluids to keep your urine clear or pale yellow.  Avoid caffeine, tea, and carbonated beverages. They tend to irritate your bladder.  Empty your bladder often. Avoid holding urine for long periods of time.  Empty your bladder before and after sexual intercourse.  After a bowel movement, women should cleanse from front to back. Use each tissue only once. SEEK MEDICAL CARE IF:   You have back pain.  You develop a fever.  Your symptoms do not begin to resolve within 3 days. SEEK IMMEDIATE MEDICAL CARE IF:   You have severe back pain or lower abdominal pain.  You develop chills.  You have nausea or vomiting.  You have continued burning or discomfort with urination. MAKE SURE YOU:   Understand these instructions.  Will watch your condition.  Will get help right away if you are not doing well or get worse. Document Released: 12/20/2004 Document Revised: 09/11/2011 Document Reviewed: 04/20/2011 [facility] Patient Information 2015 [place], [place]. This information is not intended to replace advice given to you by your health care provider. Make sure you discuss any questions you have with your  health care provider.   Emergency Department Resource Guide 1) Find a Doctor and Pay Out of Pocket Although you won't have to find out who is covered by your insurance plan, it is a good idea to ask around and get recommendations. You will then need to call the office and see if the doctor you have chosen will accept you as a new patient and what types of options they offer for patients who are self-pay. Some doctors offer discounts or will set up payment plans for their patients who do not have insurance, but you will need to ask so you aren't surprised when you get to your appointment.  2) Contact Your Local Health Department Not all health departments have doctors that can see patients for sick visits, but many do, so it is worth a call to see if yours does. If you don't know where your local health department is, you can check in your phone book. The CDC also has a tool to help you locate your state's health department, and many state websites also have listings of all of their local health departments.  3)  Find a Walk-in Clinic If your illness is not likely to be very severe or complicated, you may want to try a walk in clinic. These are popping up all over the country in pharmacies, drugstores, and shopping centers. They're usually staffed by nurse practitioners or physician assistants that have been trained to treat common illnesses and complaints. They're usually fairly quick and inexpensive. However, if you have serious medical issues or chronic medical problems, these are probably not your best option.  No Primary Care Doctor: - Call Health Connect at  [phone] - they can help you locate a primary care doctor that  accepts your insurance, provides certain services, etc. - Physician Referral Service- [phone]  Chronic Pain Problems: Organization         Address  Phone   Notes  [facility]  [phone] Patients need to be referred by their primary care doctor.    Medication Assistance: Organization         Address  Phone   Notes  [facility] Medication [facility] [address] [place] [place], [place] [address] [phone] --Must be a resident of [facility] -- Must have NO insurance coverage whatsoever (no Medicaid/ Medicare, etc.) -- The pt. MUST have a primary care doctor that directs their care regularly and follows them in the community   [facility]ist  [phone]   [employer]  [phone]    Agencies that provide inexpensive medical care: Organization         Address  Phone   Notes  [facility]  [phone]   [facility]    [phone]   [facility] [address] [place], [place] [address] [phone]   [facility] [address] [place] [place] [phone]   Planned Parenthood    [phone]   [facility]    [phone]   [facility]  [address], [place] Phone:  [phone], Fax:  [phone] Hours of Operation:  9 am - 6 pm, M-F.  Also accepts Medicaid/Medicare and self-pay.  [facility]  [address], [place] Phone: [phone], Fax: [phone]. Hours of Operation:  8:30 am - 5:30 pm, M-F.  Also accepts Medicaid and self-pay.  [facility] [place] [address] [place] Phone: [phone]   [facility] [address] Natasha Bence [place], [place] [phone], Ext. [phone] Mondays & Thursdays: 7-9 AM.  First 15 patients are seen on a first come, first serve basis.    Medicaid-accepting [facility] Providers:  Organization         Address  Phone   Notes  [facility] [address], [place] [phone] Also accepts self-pay patients.  [facility] [address] Laurell Josephs [place], [place]  [phone]   [facility] [address], [place] [phone]   [facility] [address] [place] [phone]   Renaye Rakers [address], [place]   [phone] Only accepts [place] Access [place] patients after they have their name applied to their card.   Self-Pay (no insurance) in [facility]:  Organization         Address  Phone   Notes  Sickle Cell Patients, [facility] [address] [place] [phone]   [facility] [address] [place], [place] [phone]   [facility]  [address], [place] [phone]   [facility]/Dr. Osei-Bonsu  [address] [place] or [address], [place] [phone] Phone number for both [place] and [place] locations is the same.  [facility] [address] [place] [phone]   [facility] [address] [place] or [address] [phone]   [facility] [address], [place] [phone], phone; [phone], fax Sees patients 1st and 3rd Saturday of every month.  Must not qualify for public or private insurance (i.e. Medicaid, Medicare, [place] Health Choice, Veterans' Benefits)  Household income should be no more than 200% of the poverty level The clinic cannot treat you if you are pregnant or think you are pregnant  Sexually transmitted diseases are not treated at the clinic.    Dental Care: Organization         Address  Phone  Notes  [facility] Department of [facility] [address] [place], [place] [phone] Accepts children up to age 66 who are enrolled in [place] or [place] Health Choice; pregnant women with a Medicaid card; and children who have applied for Medicaid or [place] Health Choice, but were declined, whose parents can pay a reduced fee at time of service.    [facility] Department of [facility]  [address], [place] [phone] Accepts children up to age 16 who are enrolled in [place] or [place] Health Choice; pregnant women with a Medicaid card; and children who have applied for Medicaid or [place] Health Choice, but were declined, whose parents can pay a reduced fee at time of service.  [facility]  [address] [place], [place] [phone] Patients are seen by appointment only. Walk-ins are not accepted. [facility] will see patients 20 years of age and older. Monday - Tuesday (8am-5pm) Most Wednesdays (8:30-5pm) $30 per visit, cash only  [facility]  [address], [facility] [phone] Patients are seen by appointment only. Walk-ins are not accepted. [facility] will see patients 21 years of age and older. One Wednesday Evening (Monthly: Volunteer Based).  $30 per visit, cash only  [employer]  [phone] for adults; Children under age 30, call Graduate Pediatric Dentistry at [phone]. Children aged 70-14, please call [phone] to request a pediatric application.  Dental services are provided in all areas of dental care including fillings, crowns and bridges, complete and partial dentures, implants, gum treatment, root canals, and extractions. Preventive care is also provided. Treatment is provided to both adults and children. Patients are selected via a lottery and there is often a waiting list.   [facility] [address] [place]  [phone] [email or web address]   [facility] [address] [place], [place] [phone], Ext. [phone] Second and Fourth Thursday of each month, opens at 6:30 AM; Clinic ends at 9 AM.  Patients are seen on a first-come first-served basis, and a limited number are seen during each clinic.   [facility]  [address] Ether Griffins [place], [place] [phone]   Eligibility Requirements You must have lived in [place],  [place], or [place] counties for at least the last three months.   You cannot be eligible for state or federal sponsored [employer], including [employer], [place], or [employer].   You generally cannot be eligible for healthcare insurance through your employer.    How to apply: Eligibility screenings are held every Tuesday and Wednesday afternoon from 1:00 pm until 4:00 pm. You do not need an appointment for the interview!  [facility] [address], [place], [place]   [place]  [place] Department  [place]  [phone]    Behavioral Health Resources in the Community: Intensive Outpatient Programs Organization         Address  Phone  Notes  [place]. [address] [place], [place] [phone]   [facility] [address], [place], [place]   ADS: [facility] Drug [facility] [address], [place], [place]   [place] [address],  [place], [place] or [phone]   Substance Abuse Resources Organization         Address  Phone  Notes  Al[facility] Drug Services  [phone]   [place]  [phone]   [facility] [place]   Chinita Pester  [phone]   Residential & Outpatient Substance Abuse Program  [phone]   Psychological Services Organization         Address  Phone  Notes  [facility] [place]  [place]  [phone]   [place] [address], [place] or [phone]    Mobile Crisis Teams Organization         Address  Phone  Notes  [facility], Mobile Crisis Care Unit  [phone]   [facility]  [address] [place], [place]   [place] [address] [place] [phone]    Self-Help/Support Groups Organization         Address  Phone             Notes  [place]. of [place] - variety of support groups  [place] Call for more information  Narcotics Anonymous (NA), [facility] [address], [employer] [place]  2 meetings at this location   Special educational needs teacher         Address  Phone  Notes  [facility] [place],    [place]  [phone]   [facility]  [address], [place] [phone], [place], [place]   [place], [place] [phone] Admissions: 8am-3pm M-F  Incentives Substance [place] [address],    [place], [place] [phone]   [facility] [address] Jadene Pierini [place], [place]   [facility] [address]  [place], [place]   [facility] - Intensive Outpatient [address] Kristeen Mans [address], [place], [place]   [facility] ([place].) [place].,  [place], [place] or [phone]   [facility] ([facility]) [address], [place], [place] Accepts Medicaid  [facility] [place] [address],  [place] [phone] Substance Abuse/Addiction Treatment   [facility] Resources Organization         Address  Phone  Notes  [facility]  [phone]) [phone]   Angie FavaJulie Brannon, PhD [address] [place], [place]   [phone](336) [phone] or [phone](336) [phone]   [facility] [place]   [address] [place], [place] [phone]) [phone]   [facility] [address], [place], [place] [phone](336) [phone] Insurance/Medicaid/sponsorship through [facility]  Faith and Families [address]                                    [place], [place] [phone]) [phone]  Therapy/tele-psych/case  [facility] [address]   [place], [place] [phone](336) [phone]    Dr. Lolly MustacheArfeen  [phone]) [phone]   [facility] [place]  [employer] [facility] 1) [address], [place] 2) [address], [place] 3)  [address], [place] [phone]) [phone](336) [phone]  [phone]) [phone]   [facility] Child Abuse Hotline [phone](336) [phone] or [phone](336) [phone] (After Hours)

## 2013-10-02 NOTE — ED Notes (Signed)
Pt also states chance she could be pregnant. Spotting, but not regular. No birth control.

## 2013-10-02 NOTE — Progress Notes (Signed)
*  PRELIMINARY RESULTS* Vascular Ultrasound Lower extremity venous duplex has been completed.  Preliminary findings: no evidence of DVT.   Farrel DemarkJill Eunice, RDMS, RVT  10/02/2013, 2:50 PM

## 2013-10-02 NOTE — ED Notes (Signed)
Declined W/C at D/C and was escorted to lobby by RN. 

## 2013-10-02 NOTE — ED Notes (Signed)
To ED for eval of bil feet swelling for past  cple of weeks. No hx. Pt unknow if pregnant. Last normal menstral in May with neg pregnancy since. Pt denies SOB, CP, Leg Pain. Urinating wnl per pt

## 2013-10-02 NOTE — ED Notes (Signed)
Called vascular to check on time frame for patient's testing.   Noone available to give me information at this time.  They took my name, number and patient information and stated that they will call me back.

## 2013-10-02 NOTE — ED Provider Notes (Signed)
CSN: [id number]     Arrival date & time 10/02/13  1003 History  This chart was scribed for non-physician practitioner, Raymon Mutton, PA-C,working with Gilda Crease, MD, by Karle Plumber, ED Scribe.  This patient was seen in room TR07C/TR07C and the patient's care was started at 12:04 PM.  Chief Complaint  Patient presents with  . Leg Swelling   The history is provided by the patient. No language interpreter was used.   HPI Comments:  Felicia Salinas is a 23 y.o. obese female who presents to the Emergency Department complaining of bilateral foot swelling for the past 2-3 weeks. She reports associated intermittent pain of the feet and intermittent nausea for the past week. She reports staying well hydrated and is not on her feet often. 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 She reports intermittently elevating the legs. Pt states her last regular menstrual period was in May. She reports a light, irregular period that lasted only half a day last month. She states she started spotting today. She denies CP, SOB, difficulty breathing, fever, chills, vomiting, HA, blurred or loss of vision, numbness, tingling, throbbing or calf pain. She denies any recent travel. She denies vaginal discharge. She states she took a pregnancy test with negative result two weeks ago.   Past Medical History  Diagnosis Date  . Environmental allergies   . Asthma     controlled per patient last attack in January  . Obesity    Past Surgical History  Procedure Laterality Date  . Tonsillectomy    . Wisdom tooth extraction    . Appendectomy  04/07/2011  . Cholecystectomy  10/18/2011    Procedure: LAPAROSCOPIC CHOLECYSTECTOMY WITH INTRAOPERATIVE CHOLANGIOGRAM;  Surgeon: Emelia Loron, MD;  Location: [facility] OR;  Service: General;  Laterality: N/A;   Family History  Problem Relation Age of Onset  . Breast cancer Maternal Grandmother   . Diabetes Maternal Grandmother   . Rheum  arthritis Maternal Grandmother   . Ovarian cancer Paternal Aunt   . Ovarian cancer Maternal Aunt   . Diabetes Sister   . Diabetes Paternal Grandfather   . Hypertension Maternal Grandfather   . Colon cancer Neg Hx    History  Substance Use Topics  . Smoking status: Never Smoker   . Smokeless tobacco: Never Used  . Alcohol Use: Yes     Comment: Occasional   OB History   Grav Para Term Preterm Abortions TAB SAB Ect Mult Living                 Review of Systems  Constitutional: Negative for fever and chills.  Respiratory: Negative for shortness of breath.   Cardiovascular: Positive for leg swelling. Negative for chest pain.  Gastrointestinal: Positive for nausea and abdominal pain. Negative for vomiting.  Genitourinary: Negative for vaginal bleeding.  Neurological: Negative for numbness and headaches.  All other systems reviewed and are negative.   Allergies  Benadryl; Other; Eggs or egg-derived products; and Tape  Home Medications   Prior to Admission medications   Medication Sig Start Date End Date Taking? Authorizing Provider  albuterol (PROVENTIL HFA;VENTOLIN HFA) 108 (90 BASE) MCG/ACT inhaler Inhale 2 puffs into the lungs every 4 (four) hours as needed. For shortness of breath   Yes Historical Provider, MD  loratadine (CLARITIN) 10 MG tablet Take 10 mg by mouth daily.   Yes Historical Provider, MD  montelukast (SINGULAIR) 10 MG tablet Take 10 mg by mouth  at bedtime.   Yes Historical Provider, MD  cephALEXin (KEFLEX) 500 MG capsule Take 1 capsule (500 mg total) by mouth 3 (three) times daily. 10/02/13   Jahleah Mariscal, PA-C   Triage Vitals: BP 117/69  Pulse 79  Temp(Src) 98.5 F (36.9 C) (Oral)  Resp 18  SpO2 100%  LMP 08/02/2013 Physical Exam  Nursing note and vitals reviewed. Constitutional: She is oriented to person, place, and time. She appears well-developed and well-nourished. No distress.  HENT:  Head: Normocephalic and atraumatic.  Mouth/Throat:  Oropharynx is clear and moist. No oropharyngeal exudate.  Eyes: Conjunctivae and EOM are normal. Pupils are equal, round, and reactive to light. Right eye exhibits no discharge. Left eye exhibits no discharge.  Neck: Normal range of motion. Neck supple. No tracheal deviation present.  Negative neck stiffness Negative nuchal rigidity Negative cervical lymphadenopathy Negative meningeal signs  Cardiovascular: Normal rate, regular rhythm and normal heart sounds.  Exam reveals no friction rub.   No murmur heard. Pulses:      Radial pulses are 2+ on the right side, and 2+ on the left side.       Dorsalis pedis pulses are 2+ on the right side, and 2+ on the left side.  Cap refill less than 3 seconds Positive swelling localized to the lower extremities, mainly to the dorsal aspect of the feet bilaterally Negative Homans sign bilaterally  Pulmonary/Chest: Effort normal and breath sounds normal. No respiratory distress. She has no wheezes. She has no rales.  Patient is able to speak in full sentences without difficulty Negative use of accessory muscles Negative stridor  Musculoskeletal: Normal range of motion.  Full ROM to upper and lower extremities without difficulty noted, negative ataxia noted.  Lymphadenopathy:    She has no cervical adenopathy.  Neurological: She is alert and oriented to person, place, and time. No cranial nerve deficit. She exhibits normal muscle tone. Coordination normal.  Cranial nerves III-XII grossly intact Strength 5+/5+ to upper and lower extremities bilaterally with resistance applied, equal distribution noted Sensation intact Negative facial drooping Negative slurred speech Negative aphasia Negative arm drift  Skin: Skin is warm and dry. She is not diaphoretic.  Psychiatric: She has a normal mood and affect. Her behavior is normal.    ED Course  Procedures (including critical care time) DIAGNOSTIC STUDIES: Oxygen Saturation is 100% on RA, normal by my  interpretation.   COORDINATION OF CARE: 12:13 PM- Will order bilateral dopplers and labs. Pt verbalizes understanding and agrees to plan.  Medications - No data to display   Results for orders placed during the hospital encounter of 10/02/13  PRO B NATRIURETIC PEPTIDE      Result Value Ref Range   Pro B Natriuretic peptide (BNP) 29.6  0 - 125 pg/mL  URINALYSIS, ROUTINE W REFLEX MICROSCOPIC      Result Value Ref Range   Color, Urine RED (*) YELLOW   APPearance CLOUDY (*) CLEAR   Specific Gravity, Urine 1.016  1.005 - 1.030   pH 7.5  5.0 - 8.0   Glucose, UA NEGATIVE  NEGATIVE mg/dL   Hgb urine dipstick LARGE (*) NEGATIVE   Bilirubin Urine NEGATIVE  NEGATIVE   Ketones, ur 15 (*) NEGATIVE mg/dL   Protein, ur 161 (*) NEGATIVE mg/dL   Urobilinogen, UA 1.0  0.0 - 1.0 mg/dL   Nitrite NEGATIVE  NEGATIVE   Leukocytes, UA SMALL (*) NEGATIVE  URINE MICROSCOPIC-ADD ON      Result Value Ref Range   Squamous Epithelial /  LPF FEW (*) RARE   WBC, UA 21-50  <3 WBC/hpf   RBC / HPF TOO NUMEROUS TO COUNT  <3 RBC/hpf   Bacteria, UA MANY (*) RARE  POC URINE PREG, ED      Result Value Ref Range   Preg Test, Ur NEGATIVE  NEGATIVE  I-STAT CHEM 8, ED      Result Value Ref Range   Sodium 139  137 - 147 mEq/L   Potassium 3.8  3.7 - 5.3 mEq/L   Chloride 101  96 - 112 mEq/L   BUN 7  6 - 23 mg/dL   Creatinine, Ser 9.60  0.50 - 1.10 mg/dL   Glucose, Bld 79  70 - 99 mg/dL   Calcium, Ion 4.54  1.12 - 1.23 mmol/L   TCO2 28  0 - 100 mmol/L   Hemoglobin 13.6  12.0 - 15.0 g/dL   HCT 09.8  11.9 - 14.7 %    Labs Review Labs Reviewed  URINALYSIS, ROUTINE W REFLEX MICROSCOPIC - Abnormal; Notable for the following:    Color, Urine RED (*)    APPearance CLOUDY (*)    Hgb urine dipstick LARGE (*)    Ketones, ur 15 (*)    Protein, ur 100 (*)    Leukocytes, UA SMALL (*)    All other components within normal limits  URINE MICROSCOPIC-ADD ON - Abnormal; Notable for the following:    Squamous Epithelial /  LPF FEW (*)    Bacteria, UA MANY (*)    All other components within normal limits  URINE CULTURE  PRO B NATRIURETIC PEPTIDE  POC URINE PREG, ED  I-STAT CHEM 8, ED    Imaging Review Dg Chest 2 View  10/02/2013   CLINICAL DATA:  Swelling in the feet and ankles.  EXAM: CHEST  2 VIEW  COMPARISON:  Acute abdominal series 11/05/2012. Two-view chest 08/07/2012.  FINDINGS: The heart size is normal. The lungs are clear. The visualized soft tissues and bony thorax are unremarkable.  IMPRESSION: Negative two view chest.   Electronically Signed   By: Gennette Pac M.D.   On: 10/02/2013 12:54     EKG Interpretation None      MDM   Final diagnoses:  Bilateral swelling of feet  Urinary tract infection with hematuria, site unspecified    Filed Vitals:   10/02/13 1045  BP: 117/69  Pulse: 79  Temp: 98.5 F (36.9 C)  TempSrc: Oral  Resp: 18  SpO2: 100%   I personally performed the services described in this documentation, which was scribed in my presence. The recorded information has been reviewed and is accurate.  Chem 8 noted elevated glucose. Hemoglobin and hematocrit negative elevation-hemoglobin 13.6, hematocrit 40.0. Creatinine 0.70. BNP negative elevation-29.6. Urine pregnancy negative. Urinalysis noted large hemoglobin small leukocytes with white blood cell count ranging from 21-50, many bacteria. Urine culture pending. Chest x-ray negative acute abnormalities. Negative cardiomegaly identified-heart size appears to be normal. Negative findings of pleural effusion or pulmonary hypertension. Doppler of bilateral lower extremities negative for DVT. Doubt CHF. Doubt DVT. Doubt PE. Doubt acute renal failure. Patient presenting with incidental urinary tract infection-urine culture pending. Peripheral bilateral foot swelling etiology unknown. Suspicion of patient to be beginning her menstrual cycle. Patient stable, afebrile. Patient not septic appearing. Discharged patient. Discharge patient  with antibiotics for urinary tract infection. [facility] outpatient clinic secondary to irregular menstrual cycles. Discussed with patient to rest and elevate the legs toes above her nose.  Discussed with patient to closely monitor symptoms and if symptoms are to worsen or change to report back to the ED - strict return instructions given.  Patient agreed to plan of care, understood, all questions answered.   Raymon MuttonMarissa Safira Proffit, PA-C 10/03/13 0727  Raymon MuttonMarissa Theophilus Walz, PA-C 10/03/13 [phone]

## 2013-10-03 LAB — URINE CULTURE
Colony Count: >=100000
Special Requests: NORMAL

## 2013-10-05 NOTE — ED Provider Notes (Signed)
Medical screening examination/treatment/procedure(s) were performed by non-physician practitioner and as supervising physician I was immediately available for consultation/collaboration.   EKG Interpretation None        Gilda Creasehristopher J. Bethanie Bloxom, MD 10/05/13 1544

## 2013-10-06 IMAGING — CT CT ABD-PELV W/ CM
2 of 4 series · 17 of 46 positions shown, 19 images · IV contrast (omnipaque)
Comparison: None

CLINICAL DATA: Right lower quadrant pain.  Pain radiates into the
right side.  Symptoms for 6 days.

CT ABDOMEN AND PELVIS WITH CONTRAST
TECHNIQUE: Multidetector CT imaging of the abdomen and pelvis was
performed following the standard protocol during bolus
administration of intravenous contrast.
Contrast: 100mL OMNIPAQUE IOHEXOL 300 MG/ML IV SOLN, 20mL OMNIPAQUE
IOHEXOL 300 MG/ML IV SOLN

[Series 2: routine · axial · 0.91mm/px · z∈[-446,-26]mm · 14 of 94 slices shown, 16 images]
[im 5/94  soft-tissue]
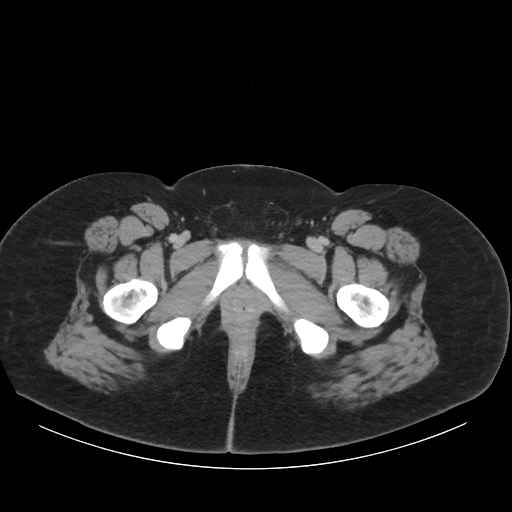
[im 5/94  bone]
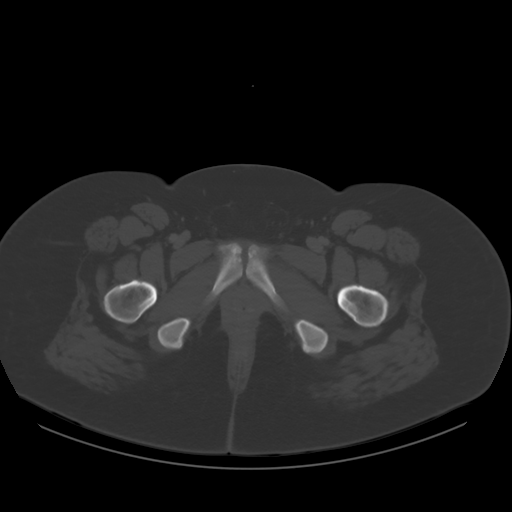
[im 13/94  soft-tissue]
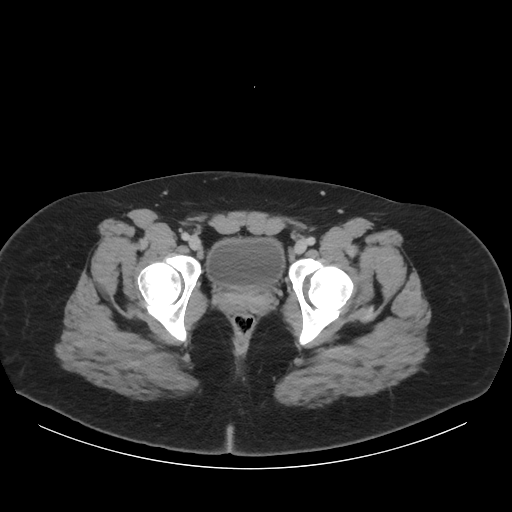
[im 17/94  soft-tissue]
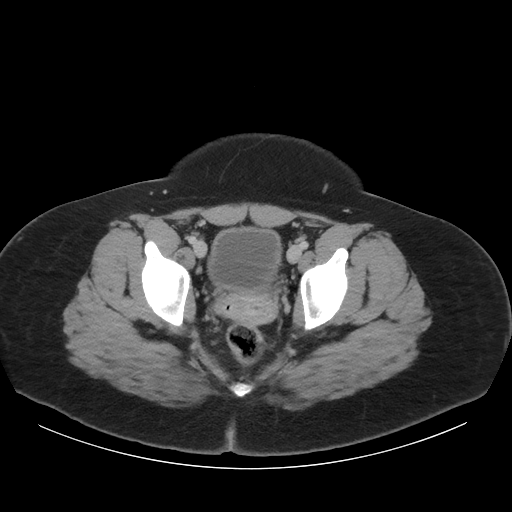
[im 25/94  soft-tissue]
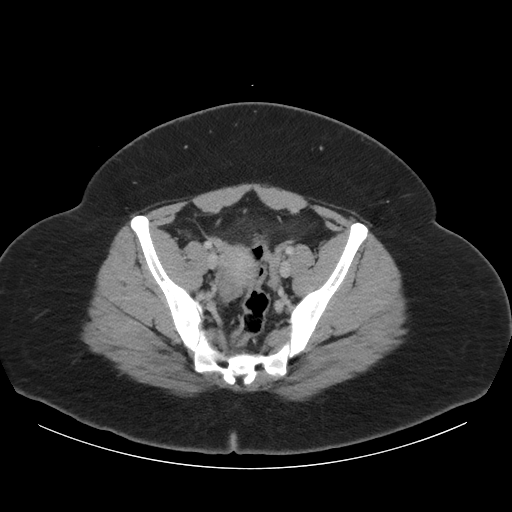
[im 33/94  soft-tissue]
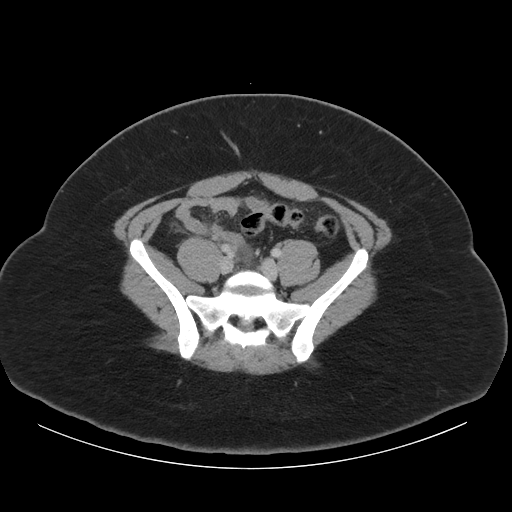
[im 37/94  soft-tissue]
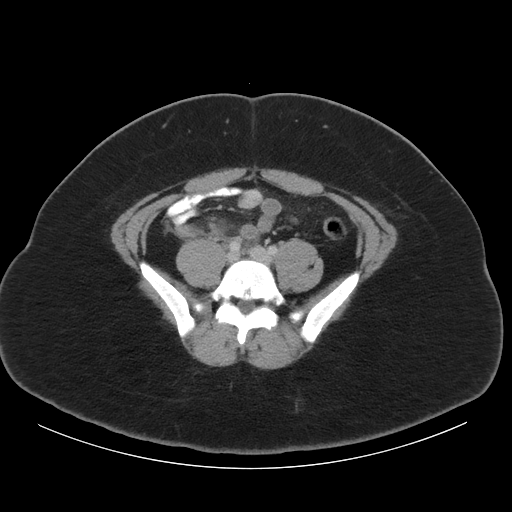
[im 45/94  soft-tissue]
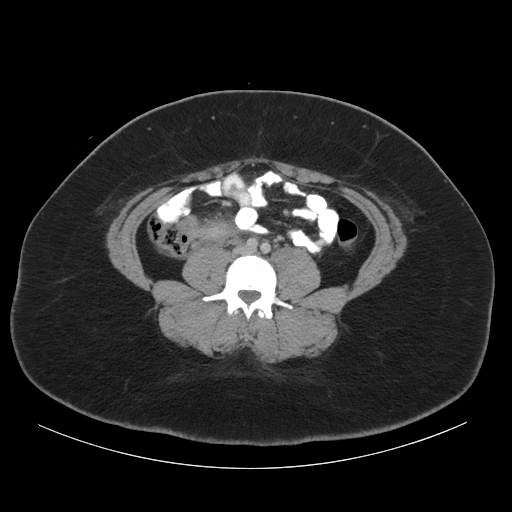
[im 49/94  soft-tissue]
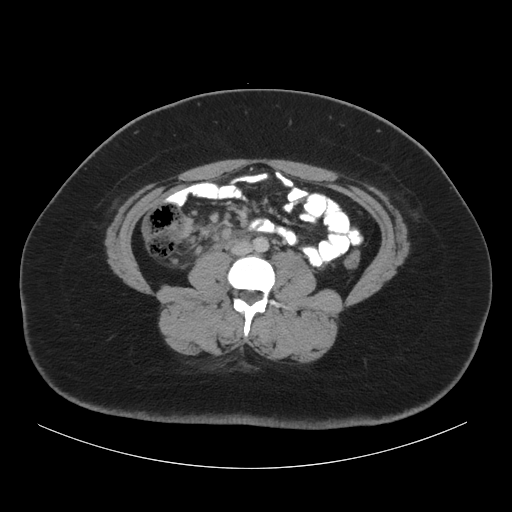
[im 57/94  soft-tissue]
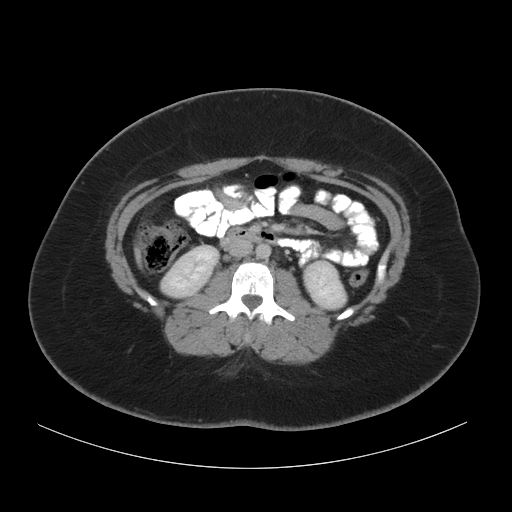
[im 57/94  bone]
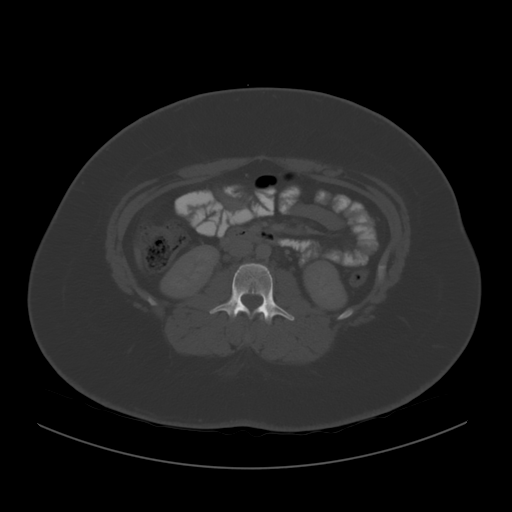
[im 61/94  soft-tissue]
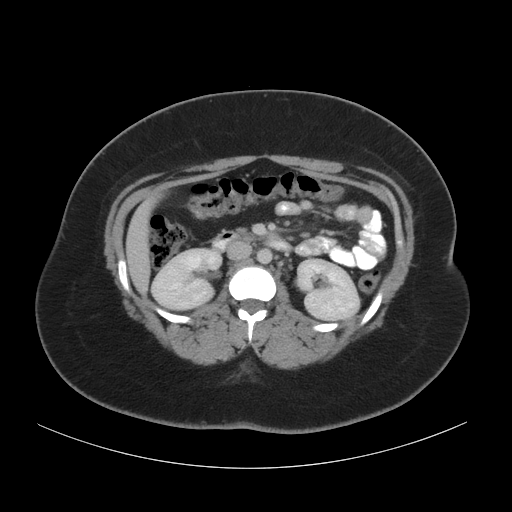
[im 69/94  soft-tissue]
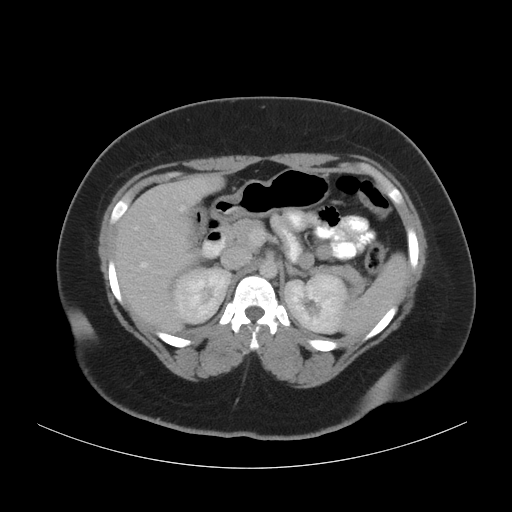
[im 77/94  soft-tissue]
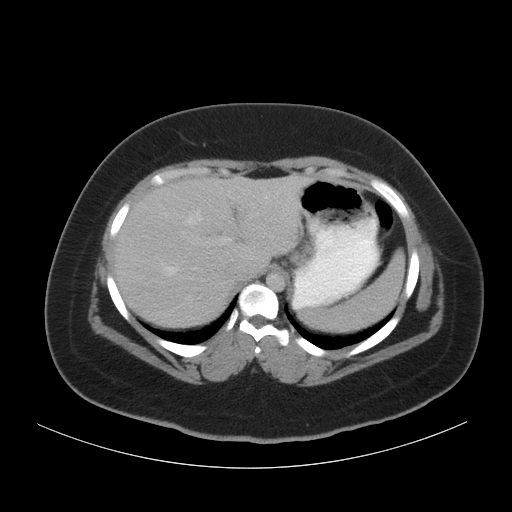
[im 81/94  soft-tissue]
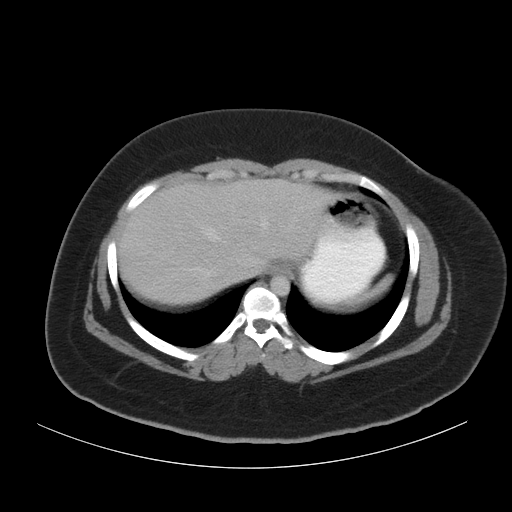
[im 89/94  soft-tissue]
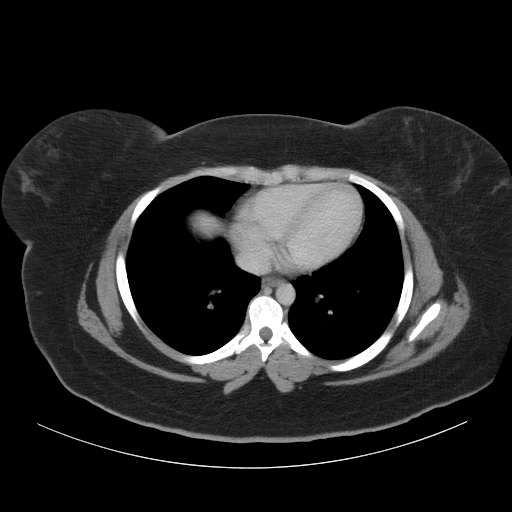

[mpr, coronals, coronal · coronal · 0.91mm/px · 3 of 74 slices shown]
[im 25/74  soft-tissue]
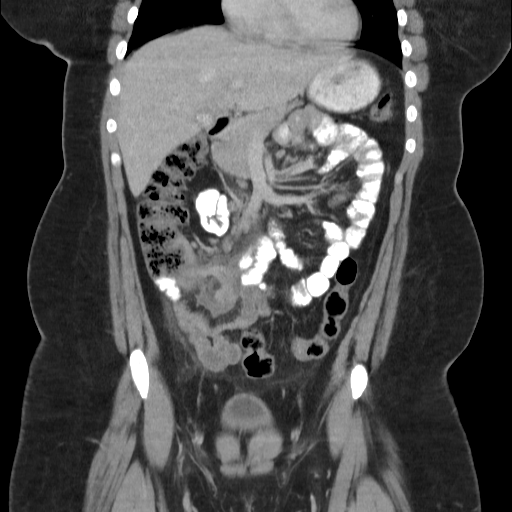
[im 33/74  soft-tissue]
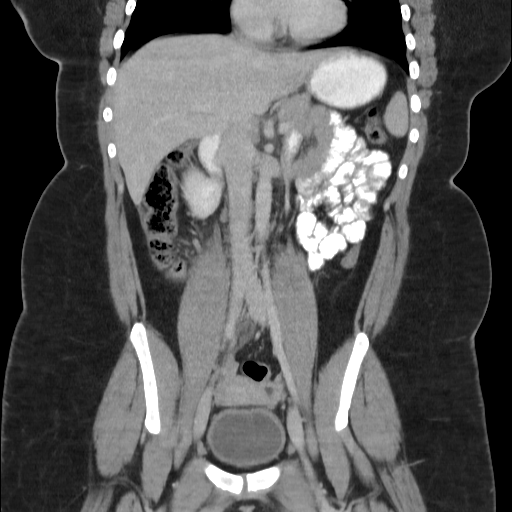
[im 41/74  soft-tissue]
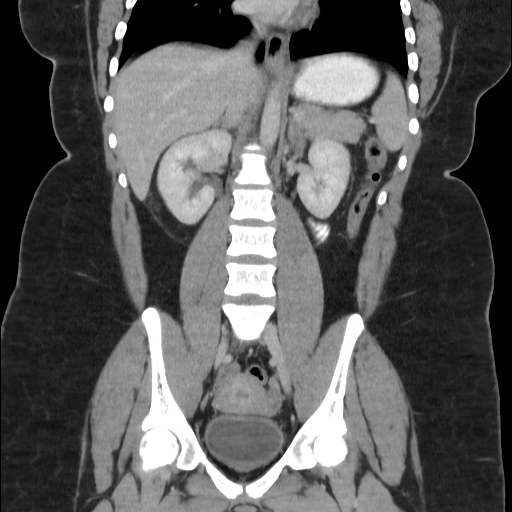

[17 of 46 positions shown; findings below may reference images not displayed]

FINDINGS: Within the right lower quadrant, there is significant
inflammatory change.  Medial to the cecum, what appears to be the
appendix appears inflamed and dilated, measuring 1.8 cm.  There is
significant stranding surrounding this structure.  Significant
right lower quadrant adenopathy is identified, favored to be
reactive.  The terminal ileum appears to be adjacent but less
affected.  There is fluid within the right lower quadrant.  No
evidence for free intraperitoneal air.

Lung bases are negative.  No focal abnormality is identified within
the liver, spleen, pancreas, adrenal glands, or kidneys.  The
gallbladder contains numerous calcified gallstones. No biliary
ductal dilatation or pericholecystic fluid identified.

The uterus is present.  No adnexal mass. Visualized osseous
structures have a normal appearance. No evidence for aortic
aneurysm.
IMPRESSION: 1.  Significant inflammatory change within the right lower
quadrant, associated with dilatation of probable appendix and
adenopathy. The differential diagnosis includes acute appendicitis
and Crohn disease.  The appearance favors acute appendicitis.
2. Multiple calcified gallstones within the gallbladder.  No other
evidence for acute cholecystitis.

The findings were discussed with Dr. Saban on 04/07/2011 at [DATE]
p.m..

## 2014-04-13 ENCOUNTER — Emergency Department (HOSPITAL_COMMUNITY): Payer: BLUE CROSS/BLUE SHIELD

## 2014-04-13 ENCOUNTER — Encounter (HOSPITAL_COMMUNITY): Payer: Self-pay | Admitting: Emergency Medicine

## 2014-04-13 ENCOUNTER — Emergency Department (HOSPITAL_COMMUNITY)
Admission: EM | Admit: 2014-04-13 | Discharge: 2014-04-13 | Disposition: A | Discharge status: Home / Self Care | Payer: BLUE CROSS/BLUE SHIELD | Attending: Emergency Medicine | Admitting: Emergency Medicine

## 2014-04-13 DIAGNOSIS — Y9389 Activity, other specified: Secondary | ICD-10-CM | POA: Insufficient documentation

## 2014-04-13 DIAGNOSIS — Y998 Other external cause status: Secondary | ICD-10-CM | POA: Insufficient documentation

## 2014-04-13 DIAGNOSIS — S39012A Strain of muscle, fascia and tendon of lower back, initial encounter: Secondary | ICD-10-CM

## 2014-04-13 DIAGNOSIS — Y92410 Unspecified street and highway as the place of occurrence of the external cause: Secondary | ICD-10-CM | POA: Diagnosis not present

## 2014-04-13 DIAGNOSIS — J45909 Unspecified asthma, uncomplicated: Secondary | ICD-10-CM | POA: Diagnosis not present

## 2014-04-13 DIAGNOSIS — E669 Obesity, unspecified: Secondary | ICD-10-CM | POA: Insufficient documentation

## 2014-04-13 DIAGNOSIS — S161XXA Strain of muscle, fascia and tendon at neck level, initial encounter: Secondary | ICD-10-CM | POA: Diagnosis not present

## 2014-04-13 DIAGNOSIS — S299XXA Unspecified injury of thorax, initial encounter: Secondary | ICD-10-CM | POA: Diagnosis present

## 2014-04-13 DIAGNOSIS — F419 Anxiety disorder, unspecified: Secondary | ICD-10-CM | POA: Diagnosis not present

## 2014-04-13 DIAGNOSIS — S20219A Contusion of unspecified front wall of thorax, initial encounter: Secondary | ICD-10-CM | POA: Diagnosis not present

## 2014-04-13 DIAGNOSIS — S0990XA Unspecified injury of head, initial encounter: Secondary | ICD-10-CM | POA: Diagnosis not present

## 2014-04-13 MED ORDER — OXYCODONE-ACETAMINOPHEN 5-325 MG PO TABS
1.0000 | ORAL_TABLET | Freq: Once | ORAL | Status: AC
  Administered 2014-04-13: 1 via ORAL
  Filled 2014-04-13: qty 1

## 2014-04-13 MED ORDER — HYDROCODONE-ACETAMINOPHEN 5-325 MG PO TABS: 2.0000 | ORAL_TABLET | ORAL | Status: DC | PRN

## 2014-04-13 MED ORDER — CYCLOBENZAPRINE HCL 10 MG PO TABS: 10.0000 mg | ORAL_TABLET | Freq: Three times a day (TID) | ORAL | Status: DC | PRN

## 2014-04-13 MED ORDER — IBUPROFEN 800 MG PO TABS: 800.0000 mg | ORAL_TABLET | Freq: Three times a day (TID) | ORAL | Status: DC
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
## 2014-04-13 NOTE — Discharge Instructions (Signed)
Back Pain, Adult °Low back pain is very common. About 1 in 5 people have back pain. The cause of low back pain is rarely dangerous. The pain often gets better over time. About half of people with a sudden onset of back pain feel better in just 2 weeks. About 8 in 10 people feel better by 6 weeks.  °CAUSES °Some common causes of back pain include: °· Strain of the muscles or ligaments supporting the spine. °· Wear and tear (degeneration) of the spinal discs. °· Arthritis. °· Direct injury to the back. °DIAGNOSIS °Most of the time, the direct cause of low back pain is not known. However, back pain can be treated effectively even when the exact cause of the pain is unknown. Answering your caregiver's questions about your overall health and symptoms is one of the most accurate ways to make sure the cause of your pain is not dangerous. If your caregiver needs more information, he or she may order lab work or imaging tests (X-rays or MRIs). However, even if imaging tests show changes in your back, this usually does not require surgery. °HOME CARE INSTRUCTIONS °For many people, back pain returns. Since low back pain is rarely dangerous, it is often a condition that people can learn to manage on their own.  °· Remain active. It is stressful on the back to sit or stand in one place. Do not sit, drive, or stand in one place for more than 30 minutes at a time. Take short walks on level surfaces as soon as pain allows. Try to increase the length of time you walk each day. °· Do not stay in bed. Resting more than 1 or 2 days can delay your recovery. °· Do not avoid exercise or work. Your body is made to move. It is not dangerous to be active, even though your back may hurt. Your back will likely heal faster if you return to being active before your pain is gone. °· Pay attention to your body when you  bend and lift. Many people have less discomfort when lifting if they bend their knees, keep the load close to their bodies, and  avoid twisting. Often, the most comfortable positions are those that put less stress on your recovering back. °· Find a comfortable position to sleep. Use a firm mattress and lie on your side with your knees slightly bent. If you lie on your back, put a pillow under your knees. °· Only take over-the-counter or prescription medicines as directed by your caregiver. Over-the-counter medicines to reduce pain and inflammation are often the most helpful. Your caregiver may prescribe muscle relaxant drugs. These medicines help dull your pain so you can more quickly return to your normal activities and healthy exercise. °· Put ice on the injured area. °· Put ice in a plastic bag. °· Place a towel between your skin and the bag. °· Leave the ice on for 15-20 minutes, 03-04 times a day for the first 2 to 3 days. After that, ice and heat may be alternated to reduce pain and spasms. °· Ask your caregiver about trying back exercises and gentle massage. This may be of some benefit. °· Avoid feeling anxious or stressed. Stress increases muscle tension and can worsen back pain. It is important to recognize when you are anxious or stressed and learn ways to manage it. Exercise is a great option. °SEEK MEDICAL CARE IF: °· You have pain that is not relieved with rest or medicine. °· You have pain that does not improve in 1 week. °· You have new symptoms. °· You are generally not feeling well. °SEEK   IMMEDIATE MEDICAL CARE IF:  °· You have pain that radiates from your back into your legs. °· You develop new bowel or bladder control problems. °· You have unusual weakness or numbness in your arms or legs. °· You develop nausea or vomiting. °· You develop abdominal pain. °· You feel faint. °Document Released: 03/12/2005 Document Revised: 09/11/2011 Document Reviewed: 07/14/2013 °Exit[employer]® Patient Information ©2015 [employer]. This information is not intended to replace advice given to you by your health care provider. Make sure you  discuss any questions you have with your health care provider. ° °Cervical Sprain °A cervical sprain is an injury in the neck in which the strong, fibrous tissues (ligaments) that connect your neck bones stretch or tear. Cervical sprains can range from mild to severe. Severe cervical sprains can cause the neck vertebrae to be unstable. This can lead to damage of the spinal cord and can result in serious nervous system problems. The amount of time it takes for a cervical sprain to get better depends on the cause and extent of the injury. Most cervical sprains heal in 1 to 3 weeks. °CAUSES  °Severe cervical sprains may be caused by:  °· Contact sport injuries (such as from football, rugby, wrestling, hockey, auto racing, gymnastics, diving, martial arts, or boxing).   °· Motor vehicle collisions.   °· Whiplash injuries. This is an injury from a sudden forward and backward whipping movement of the head and neck.  °· Falls.   °Mild cervical sprains may be caused by:  °· Being in an awkward position, such as while cradling a telephone between your ear and shoulder.   °· Sitting in a chair that does not offer proper support.   °· Working at a poorly designed computer station.   °· Looking up or down for long periods of time.   °SYMPTOMS  °· Pain, soreness, stiffness, or a burning sensation in the front, back, or sides of the neck. This discomfort may develop immediately after the injury or slowly, 24 hours or more after the injury.   °· Pain or tenderness directly in the middle of the back of the neck.   °· Shoulder or upper back pain.   °· Limited ability to move the neck.   °· Headache.   °· Dizziness.   °· Weakness, numbness, or tingling in the hands or arms.   °· Muscle spasms.   °· Difficulty swallowing or chewing.   °· Tenderness and swelling of the neck.   °DIAGNOSIS  °Most of the time your health care provider can diagnose a cervical sprain by taking your history and doing a physical exam. Your health care  provider will ask about previous neck injuries and any known neck problems, such as arthritis in the neck. X-rays may be taken to find out if there are any other problems, such as with the bones of the neck. Other tests, such as a CT scan or MRI, may also be needed.  °TREATMENT  °Treatment depends on the severity of the cervical sprain. Mild sprains can be treated with rest, keeping the neck in place (immobilization), and pain medicines. Severe cervical sprains are immediately immobilized. Further treatment is done to help with pain, muscle spasms, and other symptoms and may include: °· Medicines, such as pain relievers, numbing medicines, or muscle relaxants.   °· Physical therapy. This may involve stretching exercises, strengthening exercises, and posture training. Exercises and improved posture can help stabilize the neck, strengthen muscles, and help stop symptoms from returning.   °HOME CARE INSTRUCTIONS  °· Put ice on the injured area.   °¨ Put ice in a   plastic bag.   °¨ Place a towel between your skin and the bag.   °¨ Leave the ice on for 15-20 minutes, 3-4 times a day.   °· If your injury was severe, you may have been given a cervical collar to wear. A cervical collar is a two-piece collar designed to keep your neck from moving while it heals. °¨ Do not remove the collar unless instructed by your health care provider. °¨ If you have long hair, keep it outside of the collar. °¨ Ask your health care provider before making any adjustments to your collar. Minor adjustments may be required over time to improve comfort and reduce pressure on your chin or on the back of your head. °¨ If you are allowed to remove the collar for cleaning or bathing, follow your health care provider's instructions on how to do so safely. °¨ Keep your collar clean by wiping it with mild soap and water and drying it completely. If the collar you have been given includes removable pads, remove them every 1-2 days and hand wash them with  soap and water. Allow them to air dry. They should be completely dry before you wear them in the collar. °¨ If you are allowed to remove the collar for cleaning and bathing, wash and dry the skin of your neck. Check your skin for irritation or sores. If you see any, tell your health care provider. °¨ Do not drive while wearing the collar.   °· Only take over-the-counter or prescription medicines for pain, discomfort, or fever as directed by your health care provider.   °· Keep all follow-up appointments as directed by your health care provider.   °· Keep all physical therapy appointments as directed by your health care provider.   °· Make any needed adjustments to your workstation to promote good posture.   °· Avoid positions and activities that make your symptoms worse.   °· Warm up and stretch before being active to help prevent problems.   °SEEK MEDICAL CARE IF:  °· Your pain is not controlled with medicine.   °· You are unable to decrease your pain medicine over time as planned.   °· Your activity level is not improving as expected.   °SEEK IMMEDIATE MEDICAL CARE IF:  °· You develop any bleeding. °· You develop stomach upset. °· You have signs of an allergic reaction to your medicine.   °· Your symptoms get worse.   °· You develop new, unexplained symptoms.   °· You have numbness, tingling, weakness, or paralysis in any part of your body.   °MAKE SURE YOU:  °· Understand these instructions. °· Will watch your condition. °· Will get help right away if you are not doing well or get worse. °Document Released: 01/07/2007 Document Revised: 03/17/2013 Document Reviewed: 09/17/2012 °ExitCare® Patient Information ©2015 [employer]. This information is not intended to replace advice given to you by your health care provider. Make sure you discuss any questions you have with your health care provider. ° °
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
## 2014-06-01 ENCOUNTER — Encounter (HOSPITAL_COMMUNITY): Payer: Self-pay | Admitting: Emergency Medicine

## 2014-06-01 ENCOUNTER — Emergency Department (HOSPITAL_COMMUNITY): Payer: BLUE CROSS/BLUE SHIELD

## 2014-06-01 ENCOUNTER — Emergency Department (HOSPITAL_COMMUNITY)
Admission: EM | Admit: 2014-06-01 | Discharge: 2014-06-01 | Disposition: A | Discharge status: Home / Self Care | Payer: BLUE CROSS/BLUE SHIELD | Attending: Emergency Medicine | Admitting: Emergency Medicine

## 2014-06-01 DIAGNOSIS — Z87828 Personal history of other (healed) physical injury and trauma: Secondary | ICD-10-CM | POA: Diagnosis not present

## 2014-06-01 DIAGNOSIS — M25562 Pain in left knee: Secondary | ICD-10-CM

## 2014-06-01 DIAGNOSIS — J45909 Unspecified asthma, uncomplicated: Secondary | ICD-10-CM | POA: Diagnosis not present

## 2014-06-01 DIAGNOSIS — Z79899 Other long term (current) drug therapy: Secondary | ICD-10-CM | POA: Diagnosis not present

## 2014-06-01 DIAGNOSIS — E669 Obesity, unspecified: Secondary | ICD-10-CM | POA: Insufficient documentation

## 2014-06-01 DIAGNOSIS — Z791 Long term (current) use of non-steroidal anti-inflammatories (NSAID): Secondary | ICD-10-CM | POA: Insufficient documentation

## 2014-06-01 MED ORDER — IBUPROFEN 800 MG PO TABS: 800.0000 mg | ORAL_TABLET | Freq: Three times a day (TID) | ORAL | Status: DC

## 2014-06-01 MED ORDER — HYDROCODONE-ACETAMINOPHEN 5-325 MG PO TABS: 2.0000 | ORAL_TABLET | ORAL | Status: DC | PRN

## 2014-06-01 NOTE — ED Notes (Signed)
Patient transported to X-ray 
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
## 2014-06-01 NOTE — ED Notes (Signed)
[facility] and has continued to have pain in left knee since then

## 2014-06-01 NOTE — Discharge Instructions (Signed)

## 2015-05-23 LAB — CYTOLOGY - PAP

## 2015-09-15 ENCOUNTER — Emergency Department (HOSPITAL_COMMUNITY)
Admission: EM | Admit: 2015-09-15 | Discharge: 2015-09-15 | Disposition: A | Discharge status: Home / Self Care | Payer: 59 | Attending: Emergency Medicine | Admitting: Emergency Medicine

## 2015-09-15 ENCOUNTER — Encounter (HOSPITAL_COMMUNITY): Payer: Self-pay

## 2015-09-15 DIAGNOSIS — J45909 Unspecified asthma, uncomplicated: Secondary | ICD-10-CM | POA: Insufficient documentation

## 2015-09-15 DIAGNOSIS — M7989 Other specified soft tissue disorders: Secondary | ICD-10-CM

## 2015-09-15 DIAGNOSIS — R2243 Localized swelling, mass and lump, lower limb, bilateral: Secondary | ICD-10-CM | POA: Diagnosis present

## 2015-09-15 NOTE — ED Notes (Signed)
Pt here with bilateral ankle swelling x 2 days.  Unsure of cause.  Sitting job.  No recent travel.

## 2015-09-15 NOTE — Discharge Instructions (Signed)
There is not appear to be an emergent cause for your symptoms at this time. Your swelling could be due to heat or sitting for long periods of time. You may try to walk more, elevate your legs and use ace wraps to help with the swelling. Follow up with your doctor as needed. Return to ED for any new or worsening symptoms as we discussed.  Edema Edema is an abnormal buildup of fluids in your bodytissues. Edema is somewhatdependent on gravity to pull the fluid to the lowest place in your body. That makes the condition more common in the legs and thighs (lower extremities). Painless swelling of the feet and ankles is common and becomes more likely as you get older. It is also common in looser tissues, like around your eyes.  When the affected area is squeezed, the fluid may move out of that spot and leave a dent for a few moments. This dent is called pitting.  CAUSES  There are many possible causes of edema. Eating too much salt and being on your feet or sitting for a long time can cause edema in your legs and ankles. Hot weather may make edema worse. Common medical causes of edema include:  Heart failure.  Liver disease.  Kidney disease.  Weak blood vessels in your legs.  Cancer.  An injury.  Pregnancy.  Some medications.  Obesity. SYMPTOMS  Edema is usually painless.Your skin may look swollen or shiny.  DIAGNOSIS  Your health care provider may be able to diagnose edema by asking about your medical history and doing a physical exam. You may need to have tests such as X-rays, an electrocardiogram, or blood tests to check for medical conditions that may cause edema.  TREATMENT  Edema treatment depends on the cause. If you have heart, liver, or kidney disease, you need the treatment appropriate for these conditions. General treatment may include:  Elevation of the affected body part above the level of your heart.  Compression of the affected body part. Pressure from elastic bandages  or support stockings squeezes the tissues and forces fluid back into the blood vessels. This keeps fluid from entering the tissues.  Restriction of fluid and salt intake.  Use of a water pill (diuretic). These medications are appropriate only for some types of edema. They pull fluid out of your body and make you urinate more often. This gets rid of fluid and reduces swelling, but diuretics can have side effects. Only use diuretics as directed by your health care provider. HOME CARE INSTRUCTIONS   Keep the affected body part above the level of your heart when you are lying down.   Do not sit still or stand for prolonged periods.   Do not put anything directly under your knees when lying down.  Do not wear constricting clothing or garters on your upper legs.   Exercise your legs to work the fluid back into your blood vessels. This may help the swelling go down.   Wear elastic bandages or support stockings to reduce ankle swelling as directed by your health care provider.   Eat a low-salt diet to reduce fluid if your health care provider recommends it.   Only take medicines as directed by your health care provider. SEEK MEDICAL CARE IF:   Your edema is not responding to treatment.  You have heart, liver, or kidney disease and notice symptoms of edema.  You have edema in your legs that does not improve after elevating them.   You have  sudden and unexplained weight gain. SEEK IMMEDIATE MEDICAL CARE IF:   You develop shortness of breath or chest pain.   You cannot breathe when you lie down.  You develop pain, redness, or warmth in the swollen areas.   You have heart, liver, or kidney disease and suddenly get edema.  You have a fever and your symptoms suddenly get worse. MAKE SURE YOU:   Understand these instructions.  Will watch your condition.  Will get help right away if you are not doing well or get worse.   This information is not intended to replace advice  given to you by your health care provider. Make sure you discuss any questions you have with your health care provider.   Document Released: 03/12/2005 Document Revised: 04/02/2014 Document Reviewed: 01/02/2013 Elsevier Interactive Patient Education [employer]2016 Elsevier Inc.

## 2015-09-15 NOTE — ED Provider Notes (Signed)
CSN: [id number]     Arrival date & time 09/15/15  1309 History  By signing my name below, I, Rosario AdieWilliam Andrew Hiatt, attest that this documentation has been prepared under the direction and in the presence of General MillsBenjamin Mccauley Diehl, PA-C.   Electronically Signed: Rosario AdieWilliam Andrew Hiatt, ED Scribe. 09/15/2015. 2:14 PM.   Chief Complaint  Patient presents with  . Leg Swelling   The history is provided by the patient and medical records. No language interpreter was used.   HPI Comments: Felicia Salinas is a 25 y.o. female with a PMHx significant for obesity who presents to the Emergency Department complaining of gradual onset, unchanged, constant, bilateral foot swelling onset 1 week ago. She has had associated mild pain to the affected area. Her pain is exacerbated with ambulation after sitting for long periods of time. Pt reports that she has tried elevating her legs with no relief of her swelling. She has a history of similar swelling, and was seen in the ED in 2015 (~2 years ago) for her symptoms. Pt reports that she regularly sits at a desk all day while working at her job. She denies any recent long travel. She does not have a hx of blood clots, CHF, or DVT. She is not currently taking BCP. Pt denies SOB, chest pain, dysuria, hematuria, other urinary symptoms, or cough.   Past Medical History  Diagnosis Date  . Environmental allergies   . Asthma     controlled per patient last attack in January  . Obesity    Past Surgical History  Procedure Laterality Date  . Tonsillectomy    . Wisdom tooth extraction    . Appendectomy  04/07/2011  . Cholecystectomy  10/18/2011    Procedure: LAPAROSCOPIC CHOLECYSTECTOMY WITH INTRAOPERATIVE CHOLANGIOGRAM;  Surgeon: Emelia LoronMatthew Wakefield, MD;  [facility];  Service: General;  Laterality: N/A;   Family History  Problem Relation Age of Onset  . Breast cancer Maternal Grandmother   . Diabetes Maternal Grandmother   . Rheum arthritis Maternal Grandmother   .  Ovarian cancer Paternal Aunt   . Ovarian cancer Maternal Aunt   . Diabetes Sister   . Diabetes Paternal Grandfather   . Hypertension Maternal Grandfather   . Colon cancer Neg Hx    Social History  Substance Use Topics  . Smoking status: Never Smoker   . Smokeless tobacco: Never Used  . Alcohol Use: Yes     Comment: Occasional   OB History    No data available     Review of Systems A complete 10 system review of systems was obtained and all systems are negative except as noted in the HPI and PMH.   Allergies  Dairy aid; Shellfish allergy; Benadryl; Eggs or egg-derived products; and Tape  Home Medications   Prior to Admission medications   Medication Sig Start Date End Date Taking? Authorizing Provider  albuterol (PROVENTIL HFA;VENTOLIN HFA) 108 (90 BASE) MCG/ACT inhaler Inhale 2 puffs into the lungs every 4 (four) hours as needed for shortness of breath (shortness of breath). For shortness of breath    Historical Provider, MD  cephALEXin (KEFLEX) 500 MG capsule Take 1 capsule (500 mg total) by mouth 3 (three) times daily. Patient not taking: Reported on 04/13/2014 10/02/13   Marissa Sciacca, PA-C  cyclobenzaprine (FLEXERIL) 10 MG tablet Take 1 tablet (10 mg total) by mouth 3 (three) times daily as needed for muscle spasms. Patient not taking: Reported on 06/01/2014 04/13/14   Gilda Creasehristopher J Pollina, MD  EPINEPHrine 0.3  mg/0.3 mL IJ SOAJ injection Inject 0.3 mg into the muscle once.    Historical Provider, MD  ferrous sulfate 325 (65 FE) MG tablet Take 325 mg by mouth daily with breakfast.    Historical Provider, MD  HYDROcodone-acetaminophen (NORCO/VICODIN) 5-325 MG per tablet Take 2 tablets by mouth every 4 (four) hours as needed for moderate pain. 06/01/14   Elson AreasLeslie K Sofia, PA-C  ibuprofen (ADVIL,MOTRIN) 800 MG tablet Take 1 tablet (800 mg total) by mouth 3 (three) times daily. 06/01/14   Elson AreasLeslie K Sofia, PA-C  loratadine (CLARITIN) 10 MG tablet Take 10 mg by mouth daily as needed for  allergies (allergies).     Historical Provider, MD  montelukast (SINGULAIR) 10 MG tablet Take 10 mg by mouth at bedtime.    Historical Provider, MD  Prenatal Vit-Fe Fumarate-FA (MULTIVITAMIN-PRENATAL) 27-0.8 MG TABS tablet Take 1 tablet by mouth daily at 12 noon.    Historical Provider, MD   BP 119/80 mmHg  Pulse 84  Temp(Src) 98.2 F (36.8 C) (Oral)  Resp 16  SpO2 100%  LMP 08/19/2015   Physical Exam  Constitutional: She is oriented to person, place, and time. She appears well-developed and well-nourished. No distress.  Pt is obese.  HENT:  Head: Normocephalic.  Eyes: Conjunctivae are normal.  Neck: Normal range of motion.  Cardiovascular: Normal rate, regular rhythm and normal heart sounds.   No murmur heard. Pulmonary/Chest: Effort normal and breath sounds normal. No respiratory distress. She has no wheezes. She has no rales.  Abdominal: She exhibits no distension.  Musculoskeletal: Normal range of motion.       Right foot: There is tenderness.       Left foot: There is tenderness.  She has very mild foot swelling without erythema, tenderness, or other abnormal findings. She does not have calf pain, neg homans sign bilaterally, or unilateral leg swelling. FAROM  Neurological: She is alert and oriented to person, place, and time. No cranial nerve deficit. Coordination normal.  Skin: Skin is warm and dry.  Psychiatric: She has a normal mood and affect. Her behavior is normal.  Nursing note and vitals reviewed.  ED Course  Procedures (including critical care time)  DIAGNOSTIC STUDIES: Oxygen Saturation is 100% on RA, normal by my interpretation.   COORDINATION OF CARE: 2:08 PM-Discussed next steps with pt including icing, ambulating, continued elevation, and monitoring. Pt verbalized understanding and is agreeable with the plan.  Filed Vitals:   09/15/15 1324  BP: 119/80  Pulse: 84  Temp: 98.2 F (36.8 C)  Resp: 16    MDM  Patient presents for 1 week history of  bilateral foot swelling. She is PERC negative. Doubt PE, DVT. Vital signs are stable, afebrile, no cardiopulmonary complaints. No unilateral leg swelling. Symptoms likely secondary to heat edema versus sedentary lifestyle. Encouraged increased physical activity, elevation, compression with Ace wraps. Follow-up with PCP. Return precautions discussed. Final diagnoses:  Foot swelling   I personally performed the services described in this documentation, which was scribed in my presence. The recorded information has been reviewed and is accurate.     Joycie PeekBenjamin Grover Robinson, PA-C 09/15/15 1422  Pricilla LovelessScott Goldston, MD 09/15/15 [phone]

## 2016-04-02 ENCOUNTER — Encounter (HOSPITAL_COMMUNITY): Payer: Self-pay | Admitting: Emergency Medicine

## 2016-04-02 ENCOUNTER — Ambulatory Visit (HOSPITAL_COMMUNITY)
Admission: EM | Admit: 2016-04-02 | Discharge: 2016-04-02 | Disposition: A | Discharge status: Home / Self Care | Payer: 59 | Attending: Emergency Medicine | Admitting: Emergency Medicine

## 2016-04-02 DIAGNOSIS — R0982 Postnasal drip: Secondary | ICD-10-CM

## 2016-04-02 DIAGNOSIS — R059 Cough, unspecified: Secondary | ICD-10-CM

## 2016-04-02 DIAGNOSIS — R05 Cough: Secondary | ICD-10-CM

## 2016-04-02 MED ORDER — ALBUTEROL SULFATE HFA 108 (90 BASE) MCG/ACT IN AERS: 2.0000 | INHALATION_SPRAY | RESPIRATORY_TRACT | 0 refills | Status: DC | PRN

## 2016-04-02 NOTE — Discharge Instructions (Signed)
Your lungs are clear today. You have quite a bit of drainage in the back of your throat in this is likely the reason for your cough. Recommend taking either Zyrtec, Allegra for drainage and at nighttime if needed may take Chlor-Trimeton 2-4 mg every 4 hours. Drink plenty of cool liquids and stay well-hydrated. May also take dextromethorphan such as Delsym.

## 2016-04-02 NOTE — ED Provider Notes (Signed)
CSN: [id number]     Arrival date & time 04/02/16  1345 History   First MD Initiated Contact with Patient 04/02/16 1557     Chief Complaint  Patient presents with  . Cough   (Consider location/radiation/quality/duration/timing/severity/associated sxs/prior Treatment) 26 year old female complaining of a cough for one week. She has had PND, changes in the voice, questionable wheezes, no fever and possibly some shortness of breath at times. She states she has a history of asthma whenever she gets sick. She has used an albuterol inhaler the past however she does not have one now.      Past Medical History:  Diagnosis Date  . Asthma    controlled per patient last attack in January  . Environmental allergies   . Obesity    Past Surgical History:  Procedure Laterality Date  . APPENDECTOMY  04/07/2011  . CHOLECYSTECTOMY  10/18/2011   Procedure: LAPAROSCOPIC CHOLECYSTECTOMY WITH INTRAOPERATIVE CHOLANGIOGRAM;  Surgeon: Emelia LoronMatthew Wakefield, MD;  Location: [facility] OR;  Service: General;  Laterality: N/A;  . TONSILLECTOMY    . WISDOM TOOTH EXTRACTION     Family History  Problem Relation Age of Onset  . Breast cancer Maternal Grandmother   . Diabetes Maternal Grandmother   . Rheum arthritis Maternal Grandmother   . Ovarian cancer Paternal Aunt   . Ovarian cancer Maternal Aunt   . Diabetes Sister   . Diabetes Paternal Grandfather   . Hypertension Maternal Grandfather   . Colon cancer Neg Hx    Social History  Substance Use Topics  . Smoking status: Never Smoker  . Smokeless tobacco: Never Used  . Alcohol use Yes     Comment: Occasional   OB History    No data available     Review of Systems  Constitutional: Negative for activity change, chills, fatigue and fever.  HENT: Positive for postnasal drip and voice change. Negative for congestion, ear pain and sore throat.   Respiratory: Positive for cough. Negative for chest tightness.   Cardiovascular: Positive for chest pain.   Gastrointestinal: Negative.   Genitourinary: Negative.   Skin: Negative.   All other systems reviewed and are negative.   Allergies  Dairy aid [lactase]; Shellfish allergy; Benadryl [diphenhydramine hcl]; Eggs or egg-derived products; and Tape  Home Medications   Prior to Admission medications   Medication Sig Start Date End Date Taking? Authorizing Provider  albuterol (PROVENTIL HFA;VENTOLIN HFA) 108 (90 Base) MCG/ACT inhaler Inhale 2 puffs into the lungs every 4 (four) hours as needed for wheezing or shortness of breath. 04/02/16   Hayden Rasmussenavid Tanish Prien, NP   Meds Ordered and Administered this Visit  Medications - No data to display  Pulse 81   Temp 98.9 F (37.2 C) (Oral)   Resp 18   LMP 03/23/2016 (Exact Date)   SpO2 100%  No data found.   Physical Exam  Constitutional: She is oriented to person, place, and time. She appears well-developed and well-nourished. No distress.  HENT:  Mouth/Throat: No oropharyngeal exudate.  Bilateral TMs are normal. Oropharynx with much clear PND and minor cobblestoning.  Eyes: EOM are normal.  Neck: Normal range of motion. Neck supple.  Cardiovascular: Normal rate, regular rhythm, normal heart sounds and intact distal pulses.   Pulmonary/Chest: Effort normal and breath sounds normal. No respiratory distress. She has no wheezes. She has no rales. She exhibits tenderness.  Lymphadenopathy:    She has no cervical adenopathy.  Neurological: She is alert and oriented to person, place, and time.  Skin: Skin is warm  and dry.  Psychiatric: She has a normal mood and affect.  Nursing note and vitals reviewed.   Urgent Care Course   Clinical Course     Procedures (including critical care time)  Labs Review Labs Reviewed - No data to display  Imaging Review No results found.   Visual Acuity Review  Right Eye Distance:   Left Eye Distance:   Bilateral Distance:    Right Eye Near:   Left Eye Near:    Bilateral Near:         MDM   1.  Cough   2. PND (post-nasal drip)    Your lungs are clear today. You have quite a bit of drainage in the back of your throat in this is likely the reason for your cough. Recommend taking either Zyrtec, Allegra for drainage and at nighttime if needed may take Chlor-Trimeton 2-4 mg every 4 hours. Drink plenty of cool liquids and stay well-hydrated. May also take dextromethorphan such as Delsym.     Hayden Rasmussen, NP 04/02/16 1615

## 2016-04-02 NOTE — ED Triage Notes (Signed)
[facility] a [facility] and chest wall pain x 1 week.

## 2016-07-06 ENCOUNTER — Emergency Department (HOSPITAL_COMMUNITY): Payer: 59

## 2016-07-06 ENCOUNTER — Encounter (HOSPITAL_COMMUNITY): Payer: Self-pay | Admitting: Emergency Medicine

## 2016-07-06 ENCOUNTER — Emergency Department (HOSPITAL_COMMUNITY)
Admission: EM | Admit: 2016-07-06 | Discharge: 2016-07-07 | Disposition: A | Discharge status: Home / Self Care | Payer: 59 | Attending: Emergency Medicine | Admitting: Emergency Medicine

## 2016-07-06 DIAGNOSIS — M79671 Pain in right foot: Secondary | ICD-10-CM

## 2016-07-06 DIAGNOSIS — M7989 Other specified soft tissue disorders: Secondary | ICD-10-CM | POA: Insufficient documentation

## 2016-07-06 DIAGNOSIS — J45909 Unspecified asthma, uncomplicated: Secondary | ICD-10-CM | POA: Insufficient documentation

## 2016-07-06 LAB — CBC WITH DIFFERENTIAL/PLATELET
Basophils Absolute: 0 10*3/uL (ref 0.0–0.1)
Basophils Relative: 0 %
Eosinophils Absolute: 0.2 10*3/uL (ref 0.0–0.7)
Eosinophils Relative: 2 %
HEMATOCRIT: 38.9 % (ref 36.0–46.0)
Hemoglobin: 13.1 g/dL (ref 12.0–15.0)
LYMPHS PCT: 40 %
Lymphs Abs: 3.7 10*3/uL (ref 0.7–4.0)
MCH: 29.9 pg (ref 26.0–34.0)
MCHC: 33.7 g/dL (ref 30.0–36.0)
MCV: 88.8 fL (ref 78.0–100.0)
Monocytes Absolute: 0.7 10*3/uL (ref 0.1–1.0)
Monocytes Relative: 7 %
NEUTROS ABS: 4.6 10*3/uL (ref 1.7–7.7)
Neutrophils Relative %: 51 %
Platelets: 410 10*3/uL — ABNORMAL HIGH (ref 150–400)
RBC: 4.38 MIL/uL (ref 3.87–5.11)
RDW: 12.8 % (ref 11.5–15.5)
WBC: 9.1 10*3/uL (ref 4.0–10.5)

## 2016-07-06 LAB — COMPREHENSIVE METABOLIC PANEL
ALBUMIN: 4.1 g/dL (ref 3.5–5.0)
ALK PHOS: 52 U/L (ref 38–126)
ALT: 29 U/L (ref 14–54)
ANION GAP: 8 (ref 5–15)
AST: 20 U/L (ref 15–41)
BUN: 18 mg/dL (ref 6–20)
CHLORIDE: 104 mmol/L (ref 101–111)
CO2: 26 mmol/L (ref 22–32)
Calcium: 9.5 mg/dL (ref 8.9–10.3)
Creatinine, Ser: 0.93 mg/dL (ref 0.44–1.00)
GFR calc Af Amer: >60 mL/min (ref >60)
GFR calc non Af Amer: >60 mL/min (ref >60)
GLUCOSE: 87 mg/dL (ref 65–99)
Potassium: 3.8 mmol/L (ref 3.5–5.1)
SODIUM: 138 mmol/L (ref 135–145)
Total Bilirubin: 0.5 mg/dL (ref 0.3–1.2)
Total Protein: 8.1 g/dL (ref 6.5–8.1)

## 2016-07-06 MED ORDER — MELOXICAM 15 MG PO TABS: 15.0000 mg | ORAL_TABLET | Freq: Every day | ORAL | 0 refills | Status: DC

## 2016-07-06 NOTE — ED Provider Notes (Signed)
[facility]-EMERGENCY DEPT Provider Note   CSN: [id number] Arrival date & time: 07/06/16  1847  By signing my name below, I, Felicia Salinas, attest that this documentation has been prepared under the direction and in the presence of Felicia Crigler, PA-C. Electronically Signed: Marnette Burgess Salinas, Scribe. 07/06/2016. 10:54 PM.   History   Chief Complaint Chief Complaint  Patient presents with  . Foot Swelling   The history is provided by the patient and medical records. No language interpreter was used.    HPI Comments:  Felicia Salinas is a morbidly obese 26 y.o. female with a PMHx of Allergies and Asthma, who presents to the Emergency Department complaining of constant, generalized, right foot swelling onset a week and a half ago. Pt reports this swelling arising a week and a half ago and worsening with associated pain since onset. She has a h/o Arthritis in her hands but has never experienced this type of pain in her foot before. She qualifies the pain as throbbing and rated 9/10. She states ambulation and weight bearing exacerbate her symptoms. Pt denies numbness, tingling, weakness, fever, cough, congestion, sneezing, and any other complaints at this time. Pt does not currently have a Company secretary.    Past Medical History:  Diagnosis Date  . Asthma    controlled per patient last attack in January  . Environmental allergies   . Obesity     Patient Active Problem List   Diagnosis Date Noted  . Obesity (BMI 30-39.9) 04/11/2011    Past Surgical History:  Procedure Laterality Date  . APPENDECTOMY  04/07/2011  . CHOLECYSTECTOMY  10/18/2011   Procedure: LAPAROSCOPIC CHOLECYSTECTOMY WITH INTRAOPERATIVE CHOLANGIOGRAM;  Surgeon: Emelia Loron, MD;  Location: [facility] OR;  Service: General;  Laterality: N/A;  . TONSILLECTOMY    . WISDOM TOOTH EXTRACTION      OB History    No data available       Home Medications    Prior to Admission medications   Medication Sig  Start Date End Date Taking? Authorizing Provider  albuterol (PROVENTIL HFA;VENTOLIN HFA) 108 (90 Base) MCG/ACT inhaler Inhale 2 puffs into the lungs every 4 (four) hours as needed for wheezing or shortness of breath. 04/02/16  Yes Hayden Rasmussen, NP  cetirizine (ZYRTEC) 10 MG tablet Take 10 mg by mouth daily. 05/01/16  Yes Historical Provider, MD  fluticasone (FLONASE) 50 MCG/ACT nasal spray Place 2 sprays into both nostrils daily as needed for allergies. 05/01/16  Yes Historical Provider, MD    Family History Family History  Problem Relation Age of Onset  . Breast cancer Maternal Grandmother   . Diabetes Maternal Grandmother   . Rheum arthritis Maternal Grandmother   . Ovarian cancer Paternal Aunt   . Ovarian cancer Maternal Aunt   . Diabetes Sister   . Diabetes Paternal Grandfather   . Hypertension Maternal Grandfather   . Colon cancer Neg Hx     Social History Social History  Substance Use Topics  . Smoking status: Never Smoker  . Smokeless tobacco: Never Used  . Alcohol use Yes     Comment: Occasional     Allergies   Dairy aid [lactase]; Shellfish allergy; Benadryl [diphenhydramine hcl]; Eggs or egg-derived products; and Tape   Review of Systems Review of Systems  Constitutional: Negative for activity change and fever.  HENT: Negative for congestion and sneezing.   Respiratory: Negative for cough.   Musculoskeletal: Positive for arthralgias, joint swelling and myalgias. Negative for back pain and neck  pain.  Skin: Negative for wound.  Neurological: Negative for weakness and numbness.     Physical Exam Updated Vital Signs BP (!) 118/91 (BP Location: Left Arm)   Pulse 99   Temp 98.8 F (37.1 C) (Oral)   Resp 18   Ht  (1.626 m)   Wt 278 lb (126.1 kg)   LMP 06/24/2016   SpO2 99%   BMI 47.72 kg/m   Physical Exam  Constitutional: She appears well-developed and well-nourished.  HENT:  Head: Normocephalic and atraumatic.  Eyes: Conjunctivae are normal. Pupils are  equal, round, and reactive to light.  Neck: Normal range of motion. Neck supple.  Cardiovascular: Normal rate and normal pulses.  Exam reveals no decreased pulses.   Pulses:      Dorsalis pedis pulses are 2+ on the right side, and 2+ on the left side.  Pulmonary/Chest: Effort normal.  Abdominal: She exhibits no distension.  Musculoskeletal: Normal range of motion. She exhibits tenderness. She exhibits no edema.       Right knee: Normal.       Right ankle: Normal. No tenderness. Achilles tendon exhibits no pain.       Right lower leg: Normal.       Right foot: There is tenderness, bony tenderness and swelling. There is normal range of motion.       Feet:  Neurological: She is alert. No sensory deficit.  Motor, sensation, and vascular distal to the injury is fully intact.   Skin: Skin is warm and dry.  Psychiatric: She has a normal mood and affect.  Nursing note and vitals reviewed.    ED Treatments / Results  DIAGNOSTIC STUDIES:  Oxygen Saturation is 99% on RA, normal by my interpretation.    COORDINATION OF CARE:  10:52 PM Discussed treatment plan with pt at bedside including XR of the right foot with anti-inflammatories and CBC, CMP blood work and pt agreed to plan.  11:31 PM Pt re-evaluated with discussion held on her XR results.   Labs (all labs ordered are listed, but only abnormal results are displayed) Labs Reviewed  CBC WITH DIFFERENTIAL/PLATELET - Abnormal; Notable for the following:       Result Value   Platelets 410 (*)    All other components within normal limits  COMPREHENSIVE METABOLIC PANEL    Radiology Dg Foot Complete Right  Result Date: 07/06/2016 CLINICAL DATA:  Diffuse right foot pain and swelling 1.5 weeks. EXAM: RIGHT FOOT COMPLETE - 3+ VIEW COMPARISON:  None. FINDINGS: There is diffuse soft tissue swelling of the visualized leg, ankle and dorsum of the forefoot. No acute fracture or malalignment. No bone destruction is seen. Joint spaces are  maintained. No abnormal soft tissue mass or mineralization. IMPRESSION: Nonspecific soft tissue swelling of the right leg, ankle and foot. No acute osseous abnormality. Electronically Signed   By: Tollie Eth M.D.   On: 07/06/2016 23:23    Procedures Procedures (including critical care time)  Medications Ordered in ED Medications - No data to display   Initial Impression / Assessment and Plan / ED Course  I have reviewed the triage vital signs and the nursing notes.  Pertinent labs & imaging results that were available during my care of the patient were reviewed by me and considered in my medical decision making (see chart for details).     Vital signs reviewed and are as follows: Vitals:   07/06/16 1903 07/06/16 2114  BP: (!) 162/87 (!) 118/91  Pulse: 97 99  Resp: 18 18  Temp: 98.3 F (36.8 C) 98.8 F (37.1 C)    Patient X-Ray negative for obvious fracture or dislocation.  Pt advised to follow up with podiatry. Patient given ACE wrap/crutches while in ED, conservative therapy recommended and discussed. Discussed use of Mobic. Patient will be discharged home & is agreeable with above plan. Returns precautions discussed. Pt appears safe for discharge.  Final Clinical Impressions(s) / ED Diagnoses   Final diagnoses:  Right foot pain   Patient generalized right foot pain and swelling. X-ray negative. Low suspicion for gouty arthritis given exam. No calf tenderness or swelling to suggest DVT. No increased redness or warmth suggestive of cellulitis. Conservative measurement and follow-up as above.  New Prescriptions New Prescriptions   MELOXICAM (MOBIC) 15 MG TABLET    Take 1 tablet (15 mg total) by mouth daily.   I personally performed the services described in this documentation, which was scribed in my presence. The recorded information has been reviewed and is accurate.    Felicia Crigler, PA-C 07/06/16 2349    Lyndal Pulley, MD 07/07/16 [phone]

## 2016-07-06 NOTE — Discharge Instructions (Signed)
Please read and follow all provided instructions.  Your diagnoses today include:  1. Right foot pain     Tests performed today include:  An x-ray of the affected area - does NOT show any broken bones  Vital signs. See below for your results today.   Medications prescribed:   Meloxicam - anti-inflammatory pain medication  You have been prescribed an anti-inflammatory medication or NSAID. Take with food. Do not take aspirin, ibuprofen, or naproxen if taking this medication. Take smallest effective dose for the shortest duration needed for your pain. Stop taking if you experience stomach pain or vomiting.   Take any prescribed medications only as directed.  Home care instructions:   Follow any educational materials contained in this packet  Follow R.I.C.E. Protocol:  R - rest your injury   I  - use ice on injury without applying directly to skin  C - compress injury with bandage or splint  E - elevate the injury as much as possible  Follow-up instructions: Please follow-up with your primary care provider or the provided podiatrist if you continue to have significant pain in 1 week.   Return instructions:   Please return if your toes or feet are numb or tingling, appear gray or blue, or you have severe pain (also elevate the leg and loosen splint or wrap if you were given one)  Please return to the Emergency Department if you experience worsening symptoms.   Please return if you have any other emergent concerns.  Additional Information:  Your vital signs today were: BP (!) 118/91 (BP Location: Left Arm)    Pulse 99    Temp 98.8 F (37.1 C) (Oral)    Resp 18    Ht  (1.626 m)    Wt 126.1 kg    LMP 06/24/2016    SpO2 99%    BMI 47.72 kg/m  If your blood pressure (BP) was elevated above 135/85 this visit, please have this repeated by your doctor within one month. -------------- If prescribed crutches for your injury: use crutches with non-weight bearing for the first  few days. Then, you may walk as the pain allows, or as instructed. Start gradually with weight bearing on the affected side. Once you can walk pain free, then try jogging. When you can run forwards, then you can try moving side-to-side. If you cannot walk without crutches in one week, you need a re-check. --------------

## 2016-07-06 NOTE — ED Triage Notes (Signed)
Pt verbalizes pain/swelling to right foot; denies injury; ongoing for 2.5 weeks.

## 2016-07-09 ENCOUNTER — Telehealth: Payer: Self-pay | Admitting: Podiatry

## 2016-07-09 NOTE — Telephone Encounter (Signed)
Left vm for pt to call office to schedule appt. With Dr. Logan Bores.

## 2018-09-29 DIAGNOSIS — E282 Polycystic ovarian syndrome: Secondary | ICD-10-CM | POA: Insufficient documentation

## 2018-09-29 DIAGNOSIS — J45909 Unspecified asthma, uncomplicated: Secondary | ICD-10-CM | POA: Insufficient documentation

## 2018-10-30 ENCOUNTER — Ambulatory Visit: Payer: 59 | Admitting: Family Medicine

## 2018-11-27 ENCOUNTER — Other Ambulatory Visit: Payer: Self-pay

## 2018-11-27 ENCOUNTER — Ambulatory Visit (INDEPENDENT_AMBULATORY_CARE_PROVIDER_SITE_OTHER): Payer: No Typology Code available for payment source | Admitting: Family Medicine

## 2018-11-27 ENCOUNTER — Encounter: Payer: Self-pay | Admitting: Family Medicine

## 2018-11-27 VITALS — BP 114/79 | HR 76 | Temp 99.0°F | Ht 64.0 in | Wt 288.0 lb

## 2018-11-27 DIAGNOSIS — Z13220 Encounter for screening for lipoid disorders: Secondary | ICD-10-CM

## 2018-11-27 DIAGNOSIS — Z1329 Encounter for screening for other suspected endocrine disorder: Secondary | ICD-10-CM

## 2018-11-27 DIAGNOSIS — Z23 Encounter for immunization: Secondary | ICD-10-CM | POA: Diagnosis not present

## 2018-11-27 DIAGNOSIS — Z0000 Encounter for general adult medical examination without abnormal findings: Secondary | ICD-10-CM

## 2018-11-27 MED ORDER — ALBUTEROL SULFATE HFA 108 (90 BASE) MCG/ACT IN AERS: 2.0000 | INHALATION_SPRAY | Freq: Four times a day (QID) | RESPIRATORY_TRACT | 2 refills | Status: DC | PRN

## 2018-11-27 NOTE — Patient Instructions (Addendum)
   If you have lab work done today you will be contacted with your lab results within the next 2 weeks.  If you have not heard from us then please contact us. The fastest way to get your results is to register for My Chart.   IF you received an x-ray today, you will receive an invoice from [facility]. Please contact [facility] at [phone] with questions or concerns regarding your invoice.   IF you received labwork today, you will receive an invoice from [facility]. Please contact [facility] at [phone] with questions or concerns regarding your invoice.   Our billing staff will not be able to assist you with questions regarding bills from these companies.  You will be contacted with the lab results as soon as they are available. The fastest way to get your results is to activate your My Chart account. Instructions are located on the last page of this paperwork. If you have not heard from us regarding the results in 2 weeks, please contact this office.      Preventive Care 21-39 Years Old, Female Preventive care refers to visits with your health care provider and lifestyle choices that can promote health and wellness. This includes:  A yearly physical exam. This may also be called an annual well check.  Regular dental visits and eye exams.  Immunizations.  Screening for certain conditions.  Healthy lifestyle choices, such as eating a healthy diet, getting regular exercise, not using drugs or products that contain nicotine and tobacco, and limiting alcohol use. What can I expect for my preventive care visit? Physical exam Your health care provider will check your:  Height and weight. This may be used to calculate body mass index (BMI), which tells if you are at a healthy weight.  Heart rate and blood pressure.  Skin for abnormal spots. Counseling Your health care provider may ask you questions about your:  Alcohol, tobacco, and drug use.  Emotional  well-being.  Home and relationship well-being.  Sexual activity.  Eating habits.  Work and work environment.  Method of birth control.  Menstrual cycle.  Pregnancy history. What immunizations do I need?  Influenza (flu) vaccine  This is recommended every year. Tetanus, diphtheria, and pertussis (Tdap) vaccine  You may need a Td booster every 10 years. Varicella (chickenpox) vaccine  You may need this if you have not been vaccinated. Human papillomavirus (HPV) vaccine  If recommended by your health care provider, you may need three doses over 6 months. Measles, mumps, and rubella (MMR) vaccine  You may need at least one dose of MMR. You may also need a second dose. Meningococcal conjugate (MenACWY) vaccine  One dose is recommended if you are age 19-21 years and a first-year college student living in a residence hall, or if you have one of several medical conditions. You may also need additional booster doses. Pneumococcal conjugate (PCV13) vaccine  You may need this if you have certain conditions and were not previously vaccinated. Pneumococcal polysaccharide (PPSV23) vaccine  You may need one or two doses if you smoke cigarettes or if you have certain conditions. Hepatitis A vaccine  You may need this if you have certain conditions or if you travel or work in places where you may be exposed to hepatitis A. Hepatitis B vaccine  You may need this if you have certain conditions or if you travel or work in places where you may be exposed to hepatitis B. Haemophilus influenzae type b (Hib) vaccine    You may need this if you have certain conditions. You may receive vaccines as individual doses or as more than one vaccine together in one shot (combination vaccines). Talk with your health care provider about the risks and benefits of combination vaccines. What tests do I need?  Blood tests  Lipid and cholesterol levels. These may be checked every 5 years starting at age  20.  Hepatitis C test.  Hepatitis B test. Screening  Diabetes screening. This is done by checking your blood sugar (glucose) after you have not eaten for a while (fasting).  Sexually transmitted disease (STD) testing.  BRCA-related cancer screening. This may be done if you have a family history of breast, ovarian, tubal, or peritoneal cancers.  Pelvic exam and Pap test. This may be done every 3 years starting at age 21. Starting at age 30, this may be done every 5 years if you have a Pap test in combination with an HPV test. Talk with your health care provider about your test results, treatment options, and if necessary, the need for more tests. Follow these instructions at home: Eating and drinking   Eat a diet that includes fresh fruits and vegetables, whole grains, lean protein, and low-fat dairy.  Take vitamin and mineral supplements as recommended by your health care provider.  Do not drink alcohol if: ? Your health care provider tells you not to drink. ? You are pregnant, may be pregnant, or are planning to become pregnant.  If you drink alcohol: ? Limit how much you have to 0-1 drink a day. ? Be aware of how much alcohol is in your drink. In the [place] one drink equals one 12 oz bottle of beer (355 mL), one 5 oz glass of wine (148 mL), or one 1 oz glass of hard liquor (44 mL). Lifestyle  Take daily care of your teeth and gums.  Stay active. Exercise for at least 30 minutes on 5 or more days each week.  Do not use any products that contain nicotine or tobacco, such as cigarettes, e-cigarettes, and chewing tobacco. If you need help quitting, ask your health care provider.  If you are sexually active, practice safe sex. Use a condom or other form of birth control (contraception) in order to prevent pregnancy and STIs (sexually transmitted infections). If you plan to become pregnant, see your health care provider for a preconception visit. What's next?  Visit your health  care provider once a year for a well check visit.  Ask your health care provider how often you should have your eyes and teeth checked.  Stay up to date on all vaccines. This information is not intended to replace advice given to you by your health care provider. Make sure you discuss any questions you have with your health care provider. Document Released: 05/08/2001 Document Revised: 11/21/2017 Document Reviewed: 11/21/2017 Elsevier Patient Education  2020 Elsevier Inc.  

## 2018-11-27 NOTE — Progress Notes (Signed)
9/3/202011:32 AM  Felicia Salinas 1990/06/03, 28 y.o., female [id number]  Chief Complaint  Patient presents with  . Annual Exam    HPI:   Patient is a 28 y.o. female with past medical history significant for asthma, PCOS who presents today for CPE  G&Ps: 0 Pap: September 29 2018, ob gyn STD: done by obgyn [facility] : trying to conceive, clomid rx by obgyn, recent labs, normal A1c Menses: irregular Mammogram: none FHX breast/ovarian cancer: mother in her 24s and MGM breast cancer, paternal aunt had ovarian ca, patient had negative BRCA test FHx colon cancer: none Exercise/diet: aerobics, calisthenics, working on portions, working on eating healthier, has lost weight Uses albuterol prior to exercise  There is no immunization history on file for this patient.  Allergic to eggs - no flu vaccine Due for Tdap  Wears eye glasses, needs to see eye doctor Needs to make dental appt  Depression screen [facility] 2/9 11/27/2018  Decreased Interest 0  Down, Depressed, Hopeless 0  PHQ - 2 Score 0    Fall Risk  11/27/2018  Falls in the past year? 0  Number falls in past yr: 0  Injury with Fall? 0     Allergies  Allergen Reactions  . Dairy Aid [Lactase] Anaphylaxis  . Shellfish Allergy Anaphylaxis  . Benadryl [Diphenhydramine Hcl] Hives  . Eggs Or Egg-Derived Products Hives and Rash  . Tape Rash    Prior to Admission medications   Medication Sig Start Date End Date Taking? Authorizing Provider  albuterol (PROVENTIL HFA;VENTOLIN HFA) 108 (90 Base) MCG/ACT inhaler Inhale 2 puffs into the lungs every 4 (four) hours as needed for wheezing or shortness of breath. 04/02/16  Yes Mabe, Shanon Brow, NP  cetirizine (ZYRTEC) 10 MG tablet Take 10 mg by mouth daily. 05/01/16  Yes [provider]  clomiPHENE (CLOMID) 50 MG tablet clomiphene citrate 50 mg tablet  Take 1 tablet every day by oral route.   Yes [provider]  fluticasone (FLONASE) 50 MCG/ACT nasal spray Place 2 sprays into both nostrils  daily as needed for allergies. 05/01/16  Yes [provider]  meloxicam (MOBIC) 15 MG tablet Take 1 tablet (15 mg total) by mouth daily. 07/06/16  Yes Carlisle Cater, PA-C    Past Medical History:  Diagnosis Date  . Asthma    controlled per patient last attack in January  . Environmental allergies   . Obesity     Past Surgical History:  Procedure Laterality Date  . APPENDECTOMY  04/07/2011  . CHOLECYSTECTOMY  10/18/2011   Procedure: LAPAROSCOPIC CHOLECYSTECTOMY WITH INTRAOPERATIVE CHOLANGIOGRAM;  Surgeon: Rolm Bookbinder, MD;  Location: [address];  Service: General;  Laterality: N/A;  . TONSILLECTOMY    . WISDOM TOOTH EXTRACTION      Social History   Tobacco Use  . Smoking status: Never Smoker  . Smokeless tobacco: Never Used  Substance Use Topics  . Alcohol use: Yes    Comment: Occasional    Family History  Problem Relation Age of Onset  . Breast cancer Maternal Grandmother   . Diabetes Maternal Grandmother   . Rheum arthritis Maternal Grandmother   . Ovarian cancer Paternal Aunt   . Ovarian cancer Maternal Aunt   . Diabetes Sister   . Diabetes Paternal Grandfather   . Hypertension Maternal Grandfather   . Colon cancer Neg Hx     Review of Systems  Constitutional: Negative for chills and fever.  Respiratory: Negative for cough and shortness of breath.   Cardiovascular: Negative  for chest pain, palpitations and leg swelling.  Gastrointestinal: Negative for abdominal pain, nausea and vomiting.  All other systems reviewed and are negative.    OBJECTIVE:  Today's Vitals   11/27/18 1129  BP: 114/79  Pulse: 76  Temp: 99 F (37.2 C)  SpO2: 100%  Weight: 288 lb (130.6 kg)  Height: '5\' 4"'$  (1.626 m)   Body mass index is 49.44 kg/m.   Physical Exam Vitals signs and nursing note reviewed.  Constitutional:      Appearance: She is well-developed.  HENT:     Head: Normocephalic and atraumatic.     Right Ear: Hearing, tympanic membrane, ear canal and  external ear normal.     Left Ear: Hearing, tympanic membrane, ear canal and external ear normal.     Mouth/Throat:     Mouth: Mucous membranes are moist.     Pharynx: No oropharyngeal exudate or posterior oropharyngeal erythema.  Eyes:     Extraocular Movements: Extraocular movements intact.     Conjunctiva/sclera: Conjunctivae normal.     Pupils: Pupils are equal, round, and reactive to light.  Neck:     Musculoskeletal: Neck supple.     Thyroid: No thyromegaly.  Cardiovascular:     Rate and Rhythm: Normal rate and regular rhythm.     Heart sounds: Normal heart sounds. No murmur. No friction rub. No gallop.   Pulmonary:     Effort: Pulmonary effort is normal.     Breath sounds: Normal breath sounds. No wheezing, rhonchi or rales.  Abdominal:     General: Bowel sounds are normal. There is no distension.     Palpations: Abdomen is soft. There is no hepatomegaly, splenomegaly or mass.     Tenderness: There is no abdominal tenderness.  Musculoskeletal: Normal range of motion.     Right lower leg: No edema.     Left lower leg: No edema.  Lymphadenopathy:     Cervical: No cervical adenopathy.  Skin:    General: Skin is warm and dry.  Neurological:     Mental Status: She is alert and oriented to person, place, and time.     Cranial Nerves: No cranial nerve deficit.     Gait: Gait normal.     Deep Tendon Reflexes: Reflexes are normal and symmetric.  Psychiatric:        Mood and Affect: Mood normal.        Behavior: Behavior normal.     No results found for this or any previous visit (from the past 24 hour(s)).  No results found.   ASSESSMENT and PLAN  1. Annual physical exam Routine HCM labs ordered. HCM reviewed/discussed. Anticipatory guidance regarding healthy weight, lifestyle and choices given.    2. Screening for lipid disorders - Lipid panel  3. Screening for thyroid disorder - TSH  Other orders  Take 1 tablet every day by oral route. - albuterol (VENTOLIN  HFA) 108 (90 Base) MCG/ACT inhaler; Inhale 2 puffs into the lungs every 6 (six) hours as needed for wheezing or shortness of breath. - Tdap vaccine greater than or equal to 7yo IM  Return in about 1 year (around 11/27/2019).    Rutherford Guys, MD [facility] [place], [place] [address] Ph.  [phone] Fax [phone]

## 2018-11-28 LAB — LIPID PANEL
Chol/HDL Ratio: 3.5 ratio (ref 0.0–4.4)
Cholesterol, Total: 177 mg/dL (ref 100–199)
HDL: 51 mg/dL (ref >39)
LDL Chol Calc (NIH): 113 mg/dL — ABNORMAL HIGH (ref 0–99)
Triglycerides: 66 mg/dL (ref 0–149)
VLDL Cholesterol Cal: 13 mg/dL (ref 5–40)

## 2018-11-28 LAB — TSH: TSH: 1.6 u[IU]/mL (ref 0.450–4.500)

## 2019-02-09 ENCOUNTER — Telehealth: Payer: Self-pay | Admitting: Family Medicine

## 2019-02-09 NOTE — Telephone Encounter (Signed)
Patient is calling to schedule appointment -for miagrines headaches. Please advise CB- [phone].

## 2019-02-10 ENCOUNTER — Other Ambulatory Visit: Payer: Self-pay

## 2019-02-10 ENCOUNTER — Telehealth (INDEPENDENT_AMBULATORY_CARE_PROVIDER_SITE_OTHER): Payer: No Typology Code available for payment source | Admitting: Family Medicine

## 2019-02-10 DIAGNOSIS — F32A Depression, unspecified: Secondary | ICD-10-CM

## 2019-02-10 DIAGNOSIS — G4452 New daily persistent headache (NDPH): Secondary | ICD-10-CM

## 2019-02-10 DIAGNOSIS — F419 Anxiety disorder, unspecified: Secondary | ICD-10-CM

## 2019-02-10 DIAGNOSIS — F329 Major depressive disorder, single episode, unspecified: Secondary | ICD-10-CM

## 2019-02-10 MED ORDER — ESCITALOPRAM OXALATE 10 MG PO TABS: 10.0000 mg | ORAL_TABLET | Freq: Every day | ORAL | 2 refills | Status: DC

## 2019-02-10 MED ORDER — CYCLOBENZAPRINE HCL 10 MG PO TABS: 10.0000 mg | ORAL_TABLET | Freq: Three times a day (TID) | ORAL | 0 refills | Status: DC | PRN

## 2019-02-10 MED ORDER — ONDANSETRON HCL 8 MG PO TABS: 8.0000 mg | ORAL_TABLET | Freq: Three times a day (TID) | ORAL | 0 refills | Status: DC | PRN

## 2019-02-10 NOTE — Telephone Encounter (Signed)
I see a referral was place today for psych is this the referral she inquiring about.

## 2019-02-10 NOTE — Telephone Encounter (Signed)
Patient seen today to address headaches. No further action needed at this time.

## 2019-02-10 NOTE — Progress Notes (Signed)
Pt is having headaches, anxiety and depression. Both gad 7 (15) and phq9 (10) form were completed.She feels the emotions are ranging from her ttc, she may be starting another round of clomid. She has an appt with her gyn nx wk.The headaches are causing nausea. She is not taking any medication for the headaches. She did mention during the screening for depression she finds that she is beginning to overeat and battling with weight. Also having some swelling in the feet.

## 2019-02-10 NOTE — Progress Notes (Signed)
Virtual Visit Note  I connected with patient on 02/10/19 at 919am by video doxyme and verified that I am speaking with the correct person using two identifiers. Felicia Salinas is currently located at home and patient is currently with them during visit. The provider, Rutherford Guys, MD is located in their office at time of visit.  I discussed the limitations, risks, security and privacy concerns of performing an evaluation and management service by telephone and the availability of in person appointments. I also discussed with the patient that there may be a patient responsible charge related to this service. The patient expressed understanding and agreed to proceed.   CC: headaches  HPI ? 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 Dairy Aid [Lactase] Anaphylaxis  . Shellfish Allergy Anaphylaxis  . Benadryl [Diphenhydramine Hcl] Hives  . Eggs Or Egg-Derived Products Hives and Rash  . Tape Rash    Prior to Admission medications   Medication Sig Start Date End Date Taking? Authorizing Provider  albuterol (VENTOLIN HFA) 108 (90 Base) MCG/ACT inhaler Inhale 2 puffs into the lungs every 6 (six) hours as needed for wheezing or shortness of breath. 11/27/18  Yes Rutherford Guys, MD  cetirizine (ZYRTEC) 10 MG tablet Take 10 mg by mouth daily. 05/01/16  Yes [provider]  fluticasone (FLONASE) 50  MCG/ACT nasal spray Place 2 sprays into both nostrils daily as needed for allergies. 05/01/16  Yes [provider]  clomiPHENE (CLOMID) 50 MG tablet clomiphene citrate 50 mg tablet  Take 1 tablet every day by oral route.    [provider]    Past Medical History:  Diagnosis Date  . Asthma    controlled per patient last attack in January  . Environmental allergies   . Obesity     Past Surgical History:  Procedure Laterality Date  . APPENDECTOMY  04/07/2011  . CHOLECYSTECTOMY  10/18/2011   Procedure: LAPAROSCOPIC CHOLECYSTECTOMY WITH INTRAOPERATIVE CHOLANGIOGRAM;  Surgeon: Rolm Bookbinder, MD;  Location: [place];  Service: General;  Laterality: N/A;  . TONSILLECTOMY    . WISDOM TOOTH EXTRACTION      Social History   Tobacco Use  . Smoking status: Never Smoker  . Smokeless tobacco: Never Used  Substance Use Topics  . Alcohol use: Yes    Comment: Occasional    Family History  Problem Relation Age of Onset  . Breast cancer Maternal Grandmother   . Diabetes Maternal Grandmother   . Rheum arthritis Maternal Grandmother   . Ovarian cancer Paternal Aunt   . Ovarian cancer Maternal Aunt   . Diabetes Sister   . Diabetes Paternal Grandfather   . Hypertension Maternal Grandfather   . Colon cancer Neg Hx     ROS Per hpi  Objective  Vitals as reported by the patient: none  BP Readings from Last 3 Encounters:  11/27/18 114/79  07/07/16 114/72  09/15/15 119/80  ASSESSMENT and PLAN  1. New daily persistent headache Recent normal BP, recent reassuring eye exam (no papilledema seen), favor tension headaches, consider OSA and imagining if persist. RTC precautions reviewed. trial of flexeril and addressing underlying mood. Reviewed new med r/se/b.   2. Anxiety and depression Discussed treatment options, trial of lexapro, titrate to effect, reviewed r/se/b - Ambulatory referral to Psychology  Other orders - escitalopram (LEXAPRO) 10 MG tablet; Take 1  tablet (10 mg total) by mouth at bedtime. - cyclobenzaprine (FLEXERIL) 10 MG tablet; Take 1 tablet (10 mg total) by mouth 3 (three) times daily as needed (for headaches). - ondansetron (ZOFRAN) 8 MG tablet; Take 1 tablet (8 mg total) by mouth every 8 (eight) hours as needed for nausea or vomiting.  FOLLOW-UP: 2 weeks in office   The above assessment and management plan was discussed with the patient. The patient verbalized understanding of and has agreed to the management plan. Patient is aware to call the clinic if symptoms persist or worsen. Patient is aware when to return to the clinic for a follow-up visit. Patient educated on when it is appropriate to go to the emergency department.    I provided 14 minutes of non-face-to-face time during this encounter.  Myles Lipps, MD Primary Care at [facility] [address] [place], [place] [address] Ph.  [phone] Fax [phone]

## 2019-02-17 ENCOUNTER — Ambulatory Visit (HOSPITAL_COMMUNITY)
Admission: EM | Admit: 2019-02-17 | Discharge: 2019-02-17 | Disposition: A | Discharge status: Home / Self Care | Payer: No Typology Code available for payment source | Attending: Nurse Practitioner | Admitting: Nurse Practitioner

## 2019-02-17 ENCOUNTER — Encounter (HOSPITAL_COMMUNITY): Payer: Self-pay

## 2019-02-17 ENCOUNTER — Other Ambulatory Visit: Payer: Self-pay

## 2019-02-17 DIAGNOSIS — Z20828 Contact with and (suspected) exposure to other viral communicable diseases: Secondary | ICD-10-CM | POA: Insufficient documentation

## 2019-02-17 DIAGNOSIS — Z79899 Other long term (current) drug therapy: Secondary | ICD-10-CM | POA: Diagnosis not present

## 2019-02-17 DIAGNOSIS — E669 Obesity, unspecified: Secondary | ICD-10-CM | POA: Diagnosis not present

## 2019-02-17 DIAGNOSIS — J45909 Unspecified asthma, uncomplicated: Secondary | ICD-10-CM | POA: Diagnosis not present

## 2019-02-17 DIAGNOSIS — R519 Headache, unspecified: Secondary | ICD-10-CM | POA: Diagnosis not present

## 2019-02-17 DIAGNOSIS — Z6830 Body mass index (BMI) 30.0-30.9, adult: Secondary | ICD-10-CM | POA: Insufficient documentation

## 2019-02-17 MED ORDER — BUTALBITAL-APAP-CAFFEINE 50-325-40 MG PO TABS: 1.0000 | ORAL_TABLET | Freq: Four times a day (QID) | ORAL | 0 refills | Status: DC | PRN

## 2019-02-17 MED ORDER — KETOROLAC TROMETHAMINE 60 MG/2ML IM SOLN
60.0000 mg | Freq: Once | INTRAMUSCULAR | Status: AC
  Administered 2019-02-17: 60 mg via INTRAMUSCULAR

## 2019-02-17 MED ORDER — KETOROLAC TROMETHAMINE 60 MG/2ML IM SOLN
INTRAMUSCULAR | Status: AC
  Filled 2019-02-17: qty 2

## 2019-02-17 MED ORDER — DEXAMETHASONE SODIUM PHOSPHATE 10 MG/ML IJ SOLN
10.0000 mg | Freq: Once | INTRAMUSCULAR | Status: AC
  Administered 2019-02-17: 10 mg via INTRAMUSCULAR

## 2019-02-17 MED ORDER — METOCLOPRAMIDE HCL 5 MG/ML IJ SOLN
5.0000 mg | Freq: Once | INTRAMUSCULAR | Status: AC
  Administered 2019-02-17: 5 mg via INTRAMUSCULAR

## 2019-02-17 MED ORDER — METOCLOPRAMIDE HCL 5 MG/ML IJ SOLN
INTRAMUSCULAR | Status: AC
  Filled 2019-02-17: qty 2

## 2019-02-17 MED ORDER — DEXAMETHASONE SODIUM PHOSPHATE 10 MG/ML IJ SOLN
INTRAMUSCULAR | Status: AC
  Filled 2019-02-17: qty 1

## 2019-02-17 NOTE — ED Provider Notes (Signed)
[facility]    CSN: [id number] Arrival date & time: 02/17/19  1233      History   Chief Complaint Chief Complaint  Patient presents with  . Headache    HPI Felicia Salinas is a 28 y.o. female.   Subjective:  Felicia Salinas is a 28 y.o. female who presents for evaluation of headache. Symptoms began about 2 weeks ago. Rapidity of onset was gradual. The patient gets headaches rarely. The headache is described as pounding and throbbing and is bilateral in location.  The pain is constant with varying degrees of severity. Currently she rates it 9 on a scale from 1 to 10. Other associated symptoms include nausea and photophobia. No precipitating factors have been identified. The headache was not preceded by an aura. The patient denies any decreased physical activity, depression, dizziness, loss of balance, muscle weakness, numbness of extremities, speech difficulties or vision problems. No abdominal pain, chest pain, cough, diarrhea, fatigue, fever, neck stiffness, vomiting, change in taste/smell or sore throat. Home treatment has included acetaminophen, ibuprofen, Zofran, Flexeril and resting with inadequate improvement. No other pertinent history noted. Family history negative for any family members with significant headaches. She wears glasses. Last visual exam was recent. No significant change in her prescription. Patient works from home. No known exposure to COVID-19. No sick contacts in the home.   The following portions of the patient's history were reviewed and updated as appropriate: allergies, current medications, past family history, past medical history, past social history, past surgical history and problem list.       Past Medical History:  Diagnosis Date  . Asthma    controlled per patient last attack in January  . Environmental allergies   . Obesity     Patient Active Problem List   Diagnosis Date Noted  . Asthma 09/29/2018  . Polycystic ovary syndrome  09/29/2018  . Obesity (BMI 30-39.9) 04/11/2011    Past Surgical History:  Procedure Laterality Date  . APPENDECTOMY  04/07/2011  . CHOLECYSTECTOMY  10/18/2011   Procedure: LAPAROSCOPIC CHOLECYSTECTOMY WITH INTRAOPERATIVE CHOLANGIOGRAM;  Surgeon: Emelia Loron, MD;  Location[facility];  Service: General;  Laterality: N/A;  . TONSILLECTOMY    . WISDOM TOOTH EXTRACTION      OB History   No obstetric history on file.      Home Medications    Prior to Admission medications   Medication Sig Start Date End Date Taking? Authorizing Provider  cyclobenzaprine (FLEXERIL) 10 MG tablet Take 1 tablet (10 mg total) by mouth 3 (three) times daily as needed (for headaches). 02/10/19  Yes Myles Lipps, MD  escitalopram (LEXAPRO) 10 MG tablet Take 1 tablet (10 mg total) by mouth at bedtime. 02/10/19  Yes Myles Lipps, MD  albuterol (VENTOLIN HFA) 108 (90 Base) MCG/ACT inhaler Inhale 2 puffs into the lungs every 6 (six) hours as needed for wheezing or shortness of breath. 11/27/18   Myles Lipps, MD  butalbital-acetaminophen-caffeine (FIORICET) [phone] MG tablet Take 1-2 tablets by mouth every 6 (six) hours as needed for headache. 02/17/19 02/17/20  Lurline Idol, FNP  cetirizine (ZYRTEC) 10 MG tablet Take 10 mg by mouth daily. 05/01/16   [provider]  clomiPHENE (CLOMID) 50 MG tablet clomiphene citrate 50 mg tablet  Take 1 tablet every day by oral route.    [provider]  fluticasone (FLONASE) 50 MCG/ACT nasal spray Place 2 sprays into both nostrils daily as needed for allergies. 05/01/16   [provider]  ondansetron (ZOFRAN) 8 MG tablet Take 1 tablet (8 mg total) by mouth every 8 (eight) hours as needed for nausea or vomiting. 02/10/19   Myles LippsSantiago, Irma M, MD    Family History Family History  Problem Relation Age of Onset  . Cancer Mother        breast and spinal CA  . Asthma Mother   . Healthy Father   . Breast cancer Maternal Grandmother   .  Diabetes Maternal Grandmother   . Rheum arthritis Maternal Grandmother   . Ovarian cancer Paternal Aunt   . Ovarian cancer Maternal Aunt   . Diabetes Sister   . Diabetes Paternal Grandfather   . Hypertension Maternal Grandfather   . Colon cancer Neg Hx     Social History Social History   Tobacco Use  . Smoking status: Never Smoker  . Smokeless tobacco: Never Used  Substance Use Topics  . Alcohol use: Not Currently    Comment: Occasional  . Drug use: No     Allergies   Dairy aid [lactase], Shellfish allergy, Benadryl [diphenhydramine hcl], Eggs or egg-derived products, Iodine, and Tape   Review of Systems Review of Systems  Constitutional: Negative for fever.  Eyes: Positive for photophobia. Negative for visual disturbance.  Gastrointestinal: Positive for nausea. Negative for vomiting.  Musculoskeletal: Negative for neck pain and neck stiffness.  Neurological: Positive for headaches. Negative for dizziness, speech difficulty, weakness and numbness.  All other systems reviewed and are negative.    Physical Exam Triage Vital Signs ED Triage Vitals  Enc Vitals Group     BP 02/17/19 1311 110/83     Pulse Rate 02/17/19 1311 72     Resp 02/17/19 1311 16     Temp 02/17/19 1311 99.1 F (37.3 C)     Temp Source 02/17/19 1311 Oral     SpO2 02/17/19 1311 100 %     Weight --      Height --      Head Circumference --      Peak Flow --      Pain Score 02/17/19 1309 9     Pain Loc --      Pain Edu? --      Excl. in GC? --    No data found.  Updated Vital Signs BP 110/83 (BP Location: Right Arm)   Pulse 72   Temp 99.1 F (37.3 C) (Oral)   Resp 16   SpO2 100%   Visual Acuity Right Eye Distance:   Left Eye Distance:   Bilateral Distance:    Right Eye Near:   Left Eye Near:    Bilateral Near:     Physical Exam Vitals signs reviewed.  Constitutional:      Appearance: She is well-developed. She is obese. She is not ill-appearing or toxic-appearing.  HENT:      Head: Normocephalic.  Eyes:     General: No visual field deficit.    Extraocular Movements: Extraocular movements intact.     Right eye: No nystagmus.     Left eye: No nystagmus.     Pupils: Pupils are equal, round, and reactive to light.  Neck:     Musculoskeletal: Normal range of motion and neck supple. No neck rigidity.  Cardiovascular:     Rate and Rhythm: Normal rate and regular rhythm.  Pulmonary:     Effort: Pulmonary effort is normal.     Breath sounds: Normal breath sounds.  Musculoskeletal: Normal range of motion.  Skin:  General: Skin is warm and dry.  Neurological:     Mental Status: She is alert.     Cranial Nerves: No cranial nerve deficit.     Sensory: No sensory deficit.  Psychiatric:        Mood and Affect: Mood normal.        Speech: Speech normal.        Behavior: Behavior normal.      UC Treatments / Results  Labs (all labs ordered are listed, but only abnormal results are displayed) Labs Reviewed  NOVEL CORONAVIRUS, NAA (HOSP ORDER, SEND-OUT TO REF LAB; TAT 18-24 HRS)    EKG   Radiology No results found.  Procedures Procedures (including critical care time)  Medications Ordered in UC Medications  ketorolac (TORADOL) injection 60 mg (has no administration in time range)  metoCLOPramide (REGLAN) injection 5 mg (has no administration in time range)  dexamethasone (DECADRON) injection 10 mg (has no administration in time range)  dexamethasone (DECADRON) 10 MG/ML injection (has no administration in time range)  ketorolac (TORADOL) 60 MG/2ML injection (has no administration in time range)  metoCLOPramide (REGLAN) 5 MG/ML injection (has no administration in time range)    Initial Impression / Assessment and Plan / UC Course  I have reviewed the triage vital signs and the nursing notes.  Pertinent labs & imaging results that were available during my care of the patient were reviewed by me and considered in my medical decision making (see  chart for details).    28 yo female with no significant medical history presents with a two-week history of persistent headache.  No aggravating or alleviating factors identified.  No prior history of chronic headaches.  Patient has a low-grade fever of 99.1.  All other vital signs unremarkable.  Patient is nontoxic-appearing.  No nuchal rigidity.  No focal deficits noted.  Toradol, Reglan and Decadron given in the clinic.  Covid test pending. Prescription medications: fioricet. Importance of adequate hydration discussed. Discussed lifestyle issues (diet, sleep, exercise).  Today's evaluation has revealed no signs of a dangerous process. Discussed diagnosis with patient and/or guardian. Patient and/or guardian aware of their diagnosis, possible red flag symptoms to watch out for and need for close follow up. Patient and/or guardian understands verbal and written discharge instructions. Patient and/or guardian comfortable with plan and disposition.  Patient and/or guardian has a clear mental status at this time, good insight into illness (after discussion and teaching) and has clear judgment to make decisions regarding their care  This care was provided during an unprecedented National Emergency due to the Novel Coronavirus (COVID-19) pandemic. COVID-19 infections and transmission risks place heavy strains on healthcare resources.  As this pandemic evolves, our facility, providers, and staff strive to respond fluidly, to remain operational, and to provide care relative to available resources and information. Outcomes are unpredictable and treatments are without well-defined guidelines. Further, the impact of COVID-19 on all aspects of urgent care, including the impact to patients seeking care for reasons other than COVID-19, is unavoidable during this national emergency. At this time of the global pandemic, management of patients has significantly changed, even for non-COVID positive patients given high local  and regional COVID volumes at this time requiring high healthcare system and resource utilization. The standard of care for management of both COVID suspected and non-COVID suspected patients continues to change rapidly at the local, regional, national, and global levels. This patient was worked up and treated to the best available but ever changing evidence and resources  available at this current time.   Documentation was completed with the aid of voice recognition software. Transcription may contain typographical errors.   Final Clinical Impressions(s) / UC Diagnoses   Final diagnoses:  Acute intractable headache, unspecified headache type     Discharge Instructions       Do not drink alcohol.  Do not use any products that contain nicotine or tobacco, such as cigarettes, e-cigarettes, and chewing tobacco.   Get at least 8 hours of sleep every night.  Find ways to manage stress, such as meditation, deep breathing, or yoga.  Take medications as prescribed as needed for headache   You may continue the flexeril and zofran as needed which was given to you by your doctor.   Drink plenty of fluids. Stay in home isolation until you receive results of your COVID test. You will only be notified for positive results. You may go online to MyChart in the next few days and review your results.   Please follow CDC guidelines that are attached. You may discontinue home isolation when there has been at least 10 days since symptoms onset AND 3 days fever free without antipyretics (Tylenol or Ibuprofen) AND an overall improvement in your symptoms.   Go to the ED immediately if you get worse or have any other symptoms.   Feel better soon!  Lelon Mast, FNP-C      ED Prescriptions    Medication Sig Dispense Auth. Provider   butalbital-acetaminophen-caffeine (FIORICET) 50-325-40 MG tablet Take 1-2 tablets by mouth every 6 (six) hours as needed for headache. 20 tablet Lurline Idol, FNP      PDMP not reviewed this encounter.   Lurline Idol, [place] 02/17/19 1354

## 2019-02-17 NOTE — Discharge Instructions (Signed)
°  Do not drink alcohol. Do not use any products that contain nicotine or tobacco, such as cigarettes, e-cigarettes, and chewing tobacco.  Get at least 8 hours of sleep every night. Find ways to manage stress, such as meditation, deep breathing, or yoga. Take medications as prescribed as needed for headache  You may continue the flexeril and zofran as needed which was given to you by your doctor.  Drink plenty of fluids. Stay in home isolation until you receive results of your COVID test. You will only be notified for positive results. You may go online to MyChart in the next few days and review your results.  Please follow CDC guidelines that are attached. You may discontinue home isolation when there has been at least 10 days since symptoms onset AND 3 days fever free without antipyretics (Tylenol or Ibuprofen) AND an overall improvement in your symptoms.  Go to the ED immediately if you get worse or have any other symptoms.   Feel better soon!  Aldona Bar, FNP-C

## 2019-02-17 NOTE — ED Triage Notes (Signed)
Patient presents to Urgent Care with complaints of headaches since two weeks ago. Patient reports her PCP gave her flexeril via tele-visit but the pt states it has not helped.

## 2019-02-19 LAB — NOVEL CORONAVIRUS, NAA (HOSP ORDER, SEND-OUT TO REF LAB; TAT 18-24 HRS): SARS-CoV-2, NAA: NOT DETECTED

## 2019-02-24 ENCOUNTER — Encounter: Payer: Self-pay | Admitting: Family Medicine

## 2019-02-24 ENCOUNTER — Ambulatory Visit (INDEPENDENT_AMBULATORY_CARE_PROVIDER_SITE_OTHER): Payer: No Typology Code available for payment source | Admitting: Family Medicine

## 2019-02-24 ENCOUNTER — Telehealth: Payer: Self-pay

## 2019-02-24 ENCOUNTER — Other Ambulatory Visit: Payer: Self-pay

## 2019-02-24 VITALS — BP 115/80 | HR 81 | Temp 97.5°F | Ht 64.0 in | Wt 283.0 lb

## 2019-02-24 DIAGNOSIS — F419 Anxiety disorder, unspecified: Secondary | ICD-10-CM | POA: Diagnosis not present

## 2019-02-24 DIAGNOSIS — F32A Depression, unspecified: Secondary | ICD-10-CM

## 2019-02-24 DIAGNOSIS — F329 Major depressive disorder, single episode, unspecified: Secondary | ICD-10-CM

## 2019-02-24 DIAGNOSIS — R0683 Snoring: Secondary | ICD-10-CM | POA: Diagnosis not present

## 2019-02-24 DIAGNOSIS — G4452 New daily persistent headache (NDPH): Secondary | ICD-10-CM | POA: Diagnosis not present

## 2019-02-24 MED ORDER — ESCITALOPRAM OXALATE 20 MG PO TABS: 20.0000 mg | ORAL_TABLET | Freq: Every day | ORAL | 2 refills | Status: DC

## 2019-02-24 MED ORDER — TOPIRAMATE 25 MG PO TABS: 25.0000 mg | ORAL_TABLET | Freq: Two times a day (BID) | ORAL | 2 refills | Status: DC

## 2019-02-24 NOTE — Progress Notes (Signed)
12/1/20209:53 AM  Felicia Salinas [birth year], 28 y.o., female [id number]  Chief Complaint  Patient presents with  . Migraine    no longer taking the flexeril tid, says it did not work for the pain at all. Has not taken the med in 1 wk. Went to urgent care and was given foricet, did help a bit  . Anxiety    taking the anxiety med nightly, understands that meds has to be taken a few weeks before change is felt    HPI:   Patient is a 28 y.o. female with past medical history significant for asthma, PCOS who presents today for routine followup  Last OV 2 weeks ago for headaches and anxiety Started flexeril and lexapro Saw urgent care on nov 24th - given decadron, toradol and reglan in clinic, covid neg, rx fioricet  Headaches new onset for about 2-3 weeks No previous issues with headaches Flexeril did not help headaches at all Still having headaches, daily and constant, sometimes intense, does not resolve with fioricet, nothing has resolved the headaches Still has headaches even on her days off Snores, no daytime sleepiness Had eye exam within last 2 weeks, minor change to rx  Headaches are band like throbbing Not unilateral  Mild photophobia and phonophobia No nausea or vomiting No vision changes - yesterday had headache in her eye, first time this happened Mild dizziness or ringing in her ears Works in front of computer all day  Tolerating lexapro Has not noticed any improvement Having trouble sleeping, unable to fall asleep    Depression screen [facility] 2/9 02/24/2019 11/27/2018  Decreased Interest 0 0  Down, Depressed, Hopeless 0 0  PHQ - 2 Score 0 0    Fall Risk  02/24/2019 11/27/2018  Falls in the past year? 0 0  Number falls in past yr: 0 0  Injury with Fall? 1 0     Allergies  Allergen Reactions  . Dairy Aid [Lactase] Anaphylaxis  . Shellfish Allergy Anaphylaxis  . Benadryl [Diphenhydramine Hcl] Hives  . Eggs Or Egg-Derived Products Hives and Rash  . Iodine  Hives and Rash  . Tape Rash    Prior to Admission medications   Medication Sig Start Date End Date Taking? Authorizing Provider  albuterol (VENTOLIN HFA) 108 (90 Base) MCG/ACT inhaler Inhale 2 puffs into the lungs every 6 (six) hours as needed for wheezing or shortness of breath. 11/27/18  Yes Myles Lipps, MD  butalbital-acetaminophen-caffeine (FIORICET) [phone] MG tablet Take 1-2 tablets by mouth every 6 (six) hours as needed for headache. 02/17/19 02/17/20 Yes Lurline Idol, FNP  cetirizine (ZYRTEC) 10 MG tablet Take 10 mg by mouth daily. 05/01/16  Yes [provider]  cyclobenzaprine (FLEXERIL) 10 MG tablet Take 1 tablet (10 mg total) by mouth 3 (three) times daily as needed (for headaches). 02/10/19  Yes Myles Lipps, MD  escitalopram (LEXAPRO) 10 MG tablet Take 1 tablet (10 mg total) by mouth at bedtime. 02/10/19  Yes Myles Lipps, MD  fluticasone [facility]) 50 MCG/ACT nasal spray Place 2 sprays into both nostrils daily as needed for allergies. 05/01/16  Yes [provider]  ondansetron (ZOFRAN) 8 MG tablet Take 1 tablet (8 mg total) by mouth every 8 (eight) hours as needed for nausea or vomiting. 02/10/19  Yes Myles Lipps, MD  clomiPHENE (CLOMID) 50 MG tablet clomiphene citrate 50 mg tablet  Take 1 tablet every day by oral route.    [provider]    Past Medical  History:  Diagnosis Date  . Asthma    controlled per patient last attack in January  . Environmental allergies   . Obesity     Past Surgical History:  Procedure Laterality Date  . APPENDECTOMY  04/07/2011  . CHOLECYSTECTOMY  10/18/2011   Procedure: LAPAROSCOPIC CHOLECYSTECTOMY WITH INTRAOPERATIVE CHOLANGIOGRAM;  Surgeon: Rolm Bookbinder, MD;  Location: [place];  Service: General;  Laterality: N/A;  . TONSILLECTOMY    . WISDOM TOOTH EXTRACTION      Social History   Tobacco Use  . Smoking status: Never Smoker  . Smokeless tobacco: Never Used  Substance Use Topics  .  Alcohol use: Not Currently    Comment: Occasional    Family History  Problem Relation Age of Onset  . Cancer Mother        breast and spinal CA  . Asthma Mother   . Healthy Father   . Breast cancer Maternal Grandmother   . Diabetes Maternal Grandmother   . Rheum arthritis Maternal Grandmother   . Ovarian cancer Paternal Aunt   . Ovarian cancer Maternal Aunt   . Diabetes Sister   . Diabetes Paternal Grandfather   . Hypertension Maternal Grandfather   . Colon cancer Neg Hx     ROS Per hpi  OBJECTIVE:  Today's Vitals   02/24/19 0921  BP: 115/80  Pulse: 81  Temp: (!) 97.5 F (36.4 C)  SpO2: 100%  Weight: 283 lb (128.4 kg)  Height: 5\' 4"  (1.626 m)   Body mass index is 48.58 kg/m.  Wt Readings from Last 3 Encounters:  02/24/19 283 lb (128.4 kg)  11/27/18 288 lb (130.6 kg)  07/06/16 278 lb (126.1 kg)    Physical Exam Vitals signs and nursing note reviewed.  Constitutional:      Appearance: She is well-developed.  HENT:     Head: Normocephalic and atraumatic.     Right Ear: Hearing, tympanic membrane, ear canal and external ear normal.     Left Ear: Hearing, tympanic membrane, ear canal and external ear normal.  Eyes:     Conjunctiva/sclera: Conjunctivae normal.     Pupils: Pupils are equal, round, and reactive to light.  Neck:     Musculoskeletal: Neck supple.  Cardiovascular:     Rate and Rhythm: Normal rate and regular rhythm.     Heart sounds: Normal heart sounds. No murmur. No friction rub. No gallop.   Pulmonary:     Effort: Pulmonary effort is normal.     Breath sounds: Normal breath sounds. No wheezing or rales.  Musculoskeletal:     Right lower leg: Edema (trace pitting edema) present.     Left lower leg: Edema present.  Lymphadenopathy:     Cervical: No cervical adenopathy.  Skin:    General: Skin is warm and dry.  Neurological:     Mental Status: She is alert and oriented to person, place, and time.     Cranial Nerves: Cranial nerves are  intact.     Sensory: Sensation is intact.     Motor: Motor function is intact. No tremor, abnormal muscle tone or pronator drift.     Coordination: Romberg sign negative.     Gait: Gait is intact.     Deep Tendon Reflexes: Reflexes are normal and symmetric.     No results found for this or any previous visit (from the past 24 hour(s)).  No results found.   ASSESSMENT and PLAN  1. New daily persistent headache nonfocal neuro exam. Trial of  topomax, cont with fioricet prn, reviewed r/se/b, incl rebound HA.  screen for OSA. Pending new eyeglasses, consider headache referral.   2. Anxiety and depression Not controlled. Increase lexapro.   3. Snoring Overnight pulse ox to screen for OSA  Other orders - escitalopram (LEXAPRO) 20 MG tablet; Take 1 tablet (20 mg total) by mouth at bedtime. - topiramate (TOPAMAX) 25 MG tablet; Take 1 tablet (25 mg total) by mouth 2 (two) times daily.  Return in about 4 weeks (around 03/24/2019).    Myles LippsIrma M Santiago, MD Primary [facility] at [facility] [address] [place], [place] [address] Ph.  [phone] Fax [phone]

## 2019-02-24 NOTE — Telephone Encounter (Signed)
Faxed order for overnight pulse oxymetry to [facility] [phone]

## 2019-02-24 NOTE — Patient Instructions (Addendum)
Increase lexapro to 20mg  once a day  Start topamax at 25mg  at bedime x 1 week, then increase to 50mg  (2 tabs) at bedtime  Ordering an overnight sleep ox    If you have lab work done today you will be contacted with your lab results within the next 2 weeks.  If you have not heard from [place] then please contact us. The fastest way to get your results is to register for My Chart.   IF you received an x-ray today, you will receive an invoice from [facility]. Please contact [facility] at [phone] with questions or concerns regarding your invoice.   IF you received labwork today, you will receive an invoice from [facility]. Please contact [facility] at [phone] with questions or concerns regarding your invoice.   Our billing staff will not be able to assist you with questions regarding bills from these companies.  You will be contacted with the lab results as soon as they are available. The fastest way to get your results is to activate your My Chart account. Instructions are located on the last page of this paperwork. If you have not heard from [place] regarding the results in 2 weeks, please contact this office.

## 2019-02-26 ENCOUNTER — Telehealth: Payer: Self-pay | Admitting: Family Medicine

## 2019-02-26 NOTE — Telephone Encounter (Signed)
Please advise 

## 2019-02-26 NOTE — Telephone Encounter (Signed)
Pt was recently prescribed topiramate (TOPAMAX) 25 MG tablet [[id number]]. She says this med is effecting her sleep. She would like to be prescribed something similar to this. Pharmacy:  [facility] [place], [place] [facility] [address]. Please advise at [phone].

## 2019-02-26 NOTE — Telephone Encounter (Signed)
Please clarify with patient that the medication tends to make people sleepy which is why she is to take it at bedtime. Start with 25mg  at bedtime for 2 weeks then increase to 50mg  at bedtime. thanks

## 2019-02-27 NOTE — Telephone Encounter (Signed)
Pt is saying she can't go to sleep, she thinks it's the script.

## 2019-02-28 ENCOUNTER — Other Ambulatory Visit: Payer: Self-pay

## 2019-02-28 ENCOUNTER — Encounter (HOSPITAL_COMMUNITY): Payer: Self-pay

## 2019-02-28 ENCOUNTER — Ambulatory Visit (HOSPITAL_COMMUNITY)
Admission: EM | Admit: 2019-02-28 | Discharge: 2019-02-28 | Disposition: A | Discharge status: Home / Self Care | Payer: No Typology Code available for payment source | Attending: Family Medicine | Admitting: Family Medicine

## 2019-02-28 DIAGNOSIS — R519 Headache, unspecified: Secondary | ICD-10-CM

## 2019-02-28 MED ORDER — KETOROLAC TROMETHAMINE 60 MG/2ML IM SOLN
INTRAMUSCULAR | Status: AC
  Filled 2019-02-28: qty 2

## 2019-02-28 MED ORDER — KETOROLAC TROMETHAMINE 60 MG/2ML IM SOLN
60.0000 mg | Freq: Once | INTRAMUSCULAR | Status: AC
  Administered 2019-02-28: 12:00:00 60 mg via INTRAMUSCULAR

## 2019-02-28 MED ORDER — CYCLOBENZAPRINE HCL 10 MG PO TABS: ORAL_TABLET | ORAL | 0 refills | Status: DC

## 2019-02-28 NOTE — ED Provider Notes (Signed)
[place]   [id number] 02/28/19 Arrival Time: 7616  ASSESSMENT & PLAN:  1. Acute intractable headache, unspecified headache type     Question tension component to her headaches. Does work at a computer all day. Recommend seeing an ophthalmologist for a dilated eye exam. Has had her vision checked recently. No signs of sinus infection.   Meds ordered this encounter  Medications  . ketorolac (TORADOL) injection 60 mg  . cyclobenzaprine (FLEXERIL) 10 MG tablet    Sig: Take 1 tablet by mouth before bed as needed for muscle spasm. Warning: May cause drowsiness.    Dispense:  10 tablet    Refill:  0   Normal neurological exam. Afebrile without nuchal rigidity. Discussed. Current presentation and symptoms are consis. No indication for neurodiagnostic workup at this time. Discussed.  Recommend: Follow-up Information    Schedule an appointment as soon as possible for a visit  with Rutherford Guys, MD.   Specialty: Family Medicine Contact information: [address] [place] [address] [phone]            Reviewed expectations re: course of current medical issues. Questions answered. Outlined signs and symptoms indicating need for more acute intervention. Patient verbalized understanding. After Visit Summary given.   SUBJECTIVE: History from: Patient Patient is able to give a clear and coherent history.  ELIDE STALZER is a 28 y.o. female who presents with complaint of a fairly persistent generalized headache. Onset gradual, approx one month ago. Seen here on 02/17/2019; note reviewed. Not too much relief from IM medications: "a little". History of headaches: occasional but usually don't last long and are relieved with OTC analgesics. Precipitating factors include: none which have been determined. Associated symptoms: Preceding aura: no. Nausea/vomiting: no. Vision changes: no. Increased sensitivity to light and to noises: no. Fever: no. Sinus  pressure/congestion: no. Extremity weakness: no. Home treatment has included OTC analgesics without much relief. Current headache has not limited normal daily activities. Denies depression, dizziness, loss of balance, muscle weakness, numbness of extremities, speech difficulties, vision problems and vomiting in the early morning. No head injury reported. Ambulatory without difficulty. No recent travel.  ROS: As per HPI. All other systems negative.    OBJECTIVE:  Vitals:   02/28/19 1111  BP: 119/82  Pulse: 73  Resp: 18  Temp: 99.1 F (37.3 C)  TempSrc: Oral  SpO2: 100%    General appearance: alert; NAD HENT: normocephalic; atraumatic Eyes: PERRLA; EOMI; conjunctivae normal Neck: supple with FROM Lungs: clear to auscultation bilaterally; unlabored respirations Heart: regular rate and rhythm Extremities: no edema; symmetrical with no gross deformities Skin: warm and dry Neurologic: alert; speech is fluent and clear without dysarthria or aphasia; CN 2-12 grossly intact; no facial droop; normal gait; normal symmetric reflexes; normal extremity strength and sensation throughout Psychological: alert and cooperative; normal mood and affect    Allergies  Allergen Reactions  . Dairy Aid [Lactase] Anaphylaxis  . Shellfish Allergy Anaphylaxis  . Benadryl [Diphenhydramine Hcl] Hives  . Eggs Or Egg-Derived Products Hives and Rash  . Iodine Hives and Rash  . Tape Rash    Past Medical History:  Diagnosis Date  . Asthma    controlled per patient last attack in January  . Environmental allergies   . Obesity    Social History   Socioeconomic History  . Marital status: Married    Spouse name: Not on file  . Number of children: 0  . Years of education: Not on file  . Highest  education level: Not on file  Occupational History  . Occupation: Cabin crew: [employer]  Social Needs  . Financial resource strain: Not on file  . Food insecurity    Worry: Not on file     Inability: Not on file  . Transportation needs    Medical: Not on file    Non-medical: Not on file  Tobacco Use  . Smoking status: Never Smoker  . Smokeless tobacco: Never Used  Substance and Sexual Activity  . Alcohol use: Not Currently    Comment: Occasional  . Drug use: No  . Sexual activity: Yes    Birth control/protection: None  Lifestyle  . Physical activity    Days per week: Not on file    Minutes per session: Not on file  . Stress: Not on file  Relationships  . Social Musician on phone: Not on file    Gets together: Not on file    Attends religious service: Not on file    Active member of club or organization: Not on file    Attends meetings of clubs or organizations: Not on file    Relationship status: Not on file  . Intimate partner violence    Fear of current or ex partner: Not on file    Emotionally abused: Not on file    Physically abused: Not on file    Forced sexual activity: Not on file  Other Topics Concern  . Not on file  Social History Narrative  . Not on file   Family History  Problem Relation Age of Onset  . Cancer Mother        breast and spinal CA  . Asthma Mother   . Healthy Father   . Breast cancer Maternal Grandmother   . Diabetes Maternal Grandmother   . Rheum arthritis Maternal Grandmother   . Ovarian cancer Paternal Aunt   . Ovarian cancer Maternal Aunt   . Diabetes Sister   . Diabetes Paternal Grandfather   . Hypertension Maternal Grandfather   . Colon cancer Neg Hx    Past Surgical History:  Procedure Laterality Date  . APPENDECTOMY  04/07/2011  . CHOLECYSTECTOMY  10/18/2011   Procedure: LAPAROSCOPIC CHOLECYSTECTOMY WITH INTRAOPERATIVE CHOLANGIOGRAM;  Surgeon: Emelia Loron, MD;  Location: [facility];  Service: General;  Laterality: N/A;  . TONSILLECTOMY    . WISDOM TOOTH EXTRACTION       Mardella Layman, MD 02/28/19 1134

## 2019-02-28 NOTE — ED Triage Notes (Signed)
Pt present severe headache that has been going on for over a month. Pt state that  She has tried OTC medication with some relief but not completely gone.

## 2019-03-04 ENCOUNTER — Encounter: Payer: Self-pay | Admitting: Family Medicine

## 2019-03-05 NOTE — Telephone Encounter (Signed)
We ordered an overnight pulse ox at her last OV, can you please followup with [facility] regarding this.  Also followup with patient, I am ok with her taking 75mg  of topamax at bedtime if that is helping her headaches.  Please see if she needs a new rx with adjusted dose. Thanks

## 2019-03-06 ENCOUNTER — Telehealth: Payer: Self-pay

## 2019-03-06 NOTE — Telephone Encounter (Signed)
Pending overnight pulse ox. Keep Topamax at 75mg  daily for now Needs to get new eyeglass rx.

## 2019-03-06 NOTE — Telephone Encounter (Signed)
Spoke with Flavia Shipper from [place], pt will be scheduled for the overnight pulse oxymetry next week

## 2019-03-06 NOTE — Telephone Encounter (Signed)
I have called pt to get an update on how she is doing with the new medication. Pt stated that she started with the 25 mg and then jumped up to 75 mg nightly for the last few nights. The first night she did 75 mg nightly she had some relief, however for the last couple of night her headache has not gone down. Last night pt had taking 75 mg of Topamax around 75 mg @ 6-7pm and this AM around 8-9Am pt has taken another dose of the 75 mg to see if that will give her some relief and it has not. Currently the pain level of the HA is 6-7.   Pt  would like to have additional recommendations, she does have an upcoming appt on 03/24/19.  Also, she has gotten the referral from the [place] clinic and she will give them a call back for appt.

## 2019-03-13 ENCOUNTER — Telehealth: Payer: Self-pay

## 2019-03-13 ENCOUNTER — Other Ambulatory Visit: Payer: Self-pay | Admitting: Family Medicine

## 2019-03-13 DIAGNOSIS — R0683 Snoring: Secondary | ICD-10-CM

## 2019-03-13 DIAGNOSIS — G4452 New daily persistent headache (NDPH): Secondary | ICD-10-CM

## 2019-03-13 MED ORDER — TOPIRAMATE 50 MG PO TABS: 75.0000 mg | ORAL_TABLET | Freq: Every day | ORAL | 1 refills | Status: DC

## 2019-03-13 NOTE — Telephone Encounter (Signed)
Spoke with pt to let her know that pcp has suggested that she have sleep study. She also inform that her mother will e dropping of fmla forms and they are due on 03/19/2019

## 2019-03-13 NOTE — Progress Notes (Signed)
Spoke with patient - headache not relenting for over a month, trial of flexeril, Topamax, steroids, NSAIDs Will be getting new glasses tomorrow She has been referred to sleep for abnormal pulse ox I just referred her to headache clinic At this moment referring for MRA, r/o aneurysm

## 2019-03-13 NOTE — Progress Notes (Signed)
Overnight pulse oxymetry ODI index: 14

## 2019-03-13 NOTE — Telephone Encounter (Signed)
Received fmla forms.

## 2019-03-16 ENCOUNTER — Telehealth: Payer: Self-pay

## 2019-03-16 NOTE — Telephone Encounter (Signed)
fmla faxed to [phone] due by 03/19/2019

## 2019-03-24 ENCOUNTER — Other Ambulatory Visit: Payer: Self-pay

## 2019-03-24 ENCOUNTER — Encounter: Payer: Self-pay | Admitting: Family Medicine

## 2019-03-24 ENCOUNTER — Ambulatory Visit (INDEPENDENT_AMBULATORY_CARE_PROVIDER_SITE_OTHER): Payer: No Typology Code available for payment source | Admitting: Family Medicine

## 2019-03-24 VITALS — BP 106/75 | HR 71 | Temp 98.0°F | Ht 64.0 in | Wt 278.8 lb

## 2019-03-24 DIAGNOSIS — G4452 New daily persistent headache (NDPH): Secondary | ICD-10-CM

## 2019-03-24 MED ORDER — TOPIRAMATE 100 MG PO TABS: 100.0000 mg | ORAL_TABLET | Freq: Every day | ORAL | 1 refills | Status: DC

## 2019-03-24 MED ORDER — AMITRIPTYLINE HCL 10 MG PO TABS: 10.0000 mg | ORAL_TABLET | Freq: Every day | ORAL | 1 refills | Status: DC

## 2019-03-24 NOTE — Progress Notes (Signed)
12/29/20209:16 AM  Felicia Salinas  07/10/1990, 28 y.o., female  [id number]      Chief Complaint  Patient presents with  . Follow-up    for persistent headaches. patient has still been having headaches. flexeril and fiorcet not working for her so she is no longer taking them.   HPI:  Patient is a 28 y.o. female with past medical history significant for asthma, PCOS who presents today for routine followup   Last OV a month ago  Daily persistent headaches band like thorbbing started in mid November  Failed flexeril and fioricet for abortive  Started on topomax  Had normal eye exam  Had abnormal overnight pulse ox, has appt with sleep on jan 5th  Referred to headache clinic  Scheduled for MRA  Headaches have gone from daily to intermittent, may have a day wo headaches, intensity is still present (severe)  Whole head, travel from back to front, just stays like a band  Having ringing in her ears before headache, no dizziness, occasionally has blurry vision, no loss of vision or double vision, sometimes she has eye pain, she did have eye exam that was normal when she was already having headaches, does not have new eye glass rx yet  Has occ neck pain, right sided  topomax helps a bit, taking 75mg  a day  Not sleeping well, problems with falling asleep  Has not heard from headache clinic  Waiting for MRA to be scheduled   Depression screen [facility] 2/9 03/24/2019 02/24/2019 11/27/2018  Decreased Interest 0 0 0  Down, Depressed, Hopeless 0 0 0  PHQ - 2 Score 0 0 0   Fall Risk  03/24/2019 02/24/2019 11/27/2018  Falls in the past year? 0 0 0  Number falls in past yr: 0 0 0  Injury with Fall? 0 1 0  Follow up Falls evaluation completed - -       Allergies  Allergen Reactions  . Dairy Aid [Lactase] Anaphylaxis  . Shellfish Allergy Anaphylaxis  . Benadryl [Diphenhydramine Hcl] Hives  . Eggs Or Egg-Derived Products Hives and Rash  . Iodine Hives and Rash  . Tape Rash          Prior to  Admission medications   Medication Sig Start Date End Date Taking? Authorizing Provider  albuterol (VENTOLIN HFA) 108 (90 Base) MCG/ACT inhaler Inhale 2 puffs into the lungs every 6 (six) hours as needed for wheezing or shortness of breath. 11/27/18   01/27/19, MD  butalbital-acetaminophen-caffeine (FIORICET) [phone] MG tablet Take 1-2 tablets by mouth every 6 (six) hours as needed for headache.  Patient not taking: Reported on 03/24/2019 02/17/19 02/17/20  02/19/20, FNP  cetirizine (ZYRTEC) 10 MG tablet Take 10 mg by mouth daily. 05/01/16   [provider]  cyclobenzaprine (FLEXERIL) 10 MG tablet Take 1 tablet by mouth before bed as needed for muscle spasm. Warning: May cause drowsiness.  Patient not taking: Reported on 03/24/2019 02/28/19   14/5/20, MD  escitalopram (LEXAPRO) 20 MG tablet Take 1 tablet (20 mg total) by mouth at bedtime. 02/24/19   14/1/20, MD  fluticasone (FLONASE) 50 MCG/ACT nasal spray Place 2 sprays into both nostrils daily as needed for allergies. 05/01/16   [provider]  ondansetron (ZOFRAN) 8 MG tablet Take 1 tablet (8 mg total) by mouth every 8 (eight) hours as needed for nausea or vomiting. 02/10/19   02/12/19, MD  topiramate (TOPAMAX) 50 MG tablet Take 1.5 tablets (75  mg total) by mouth at bedtime. 03/13/19 04/12/19  Rutherford Guys, MD       Past Medical History:  Diagnosis Date  . Asthma    controlled per patient last attack in January  . Environmental allergies   . Obesity         Past Surgical History:  Procedure Laterality Date  . APPENDECTOMY  04/07/2011  . CHOLECYSTECTOMY  10/18/2011   Procedure: LAPAROSCOPIC CHOLECYSTECTOMY WITH INTRAOPERATIVE CHOLANGIOGRAM; Surgeon: Rolm Bookbinder, MD; Location: [place]; Service: General; Laterality: N/A;  . TONSILLECTOMY    . WISDOM TOOTH EXTRACTION     Social History        Tobacco Use  . Smoking status: Never Smoker  . Smokeless tobacco: Never Used    Substance Use Topics  . Alcohol use: Not Currently    Comment: Occasional        Family History  Problem Relation Age of Onset  . Cancer Mother    breast and spinal CA  . Asthma Mother   . Healthy Father   . Breast cancer Maternal Grandmother   . Diabetes Maternal Grandmother   . Rheum arthritis Maternal Grandmother   . Ovarian cancer Paternal Aunt   . Ovarian cancer Maternal Aunt   . Diabetes Sister   . Diabetes Paternal Grandfather   . Hypertension Maternal Grandfather   . Colon cancer Neg Hx    ROS  Per hpi   OBJECTIVE:     Today's Vitals   03/24/19 0909  BP: 106/75  Pulse: 71  Temp: 98 F (36.7 C)  TempSrc: Temporal  SpO2: 100%  Weight: 278 lb 12.8 oz (126.5 kg)  Height: 5\' 4"  (1.626 m)   Body mass index is 47.86 kg/m.   Physical Exam  Vitals and nursing note reviewed.  Constitutional:  Appearance: She is well-developed.  HENT:  Head: Normocephalic and atraumatic.  Jaw: No tenderness.  Right Ear: Hearing, tympanic membrane, ear canal and external ear normal.  Left Ear: Hearing, tympanic membrane, ear canal and external ear normal.  Eyes:  General: No visual field deficit or scleral icterus. Extraocular Movements: Extraocular movements intact.  Conjunctiva/sclera: Conjunctivae normal.  Pupils: Pupils are equal, round, and reactive to light.  Comments: Patient reports mild pain with EOM of left eye  Pulmonary:  Effort: Pulmonary effort is normal.  Musculoskeletal:  Cervical back: Neck supple.  Skin:  General: Skin is warm and dry.  Neurological:  Mental Status: She is alert and oriented to person, place, and time.  Cranial Nerves: Cranial nerves are intact.  Sensory: Sensation is intact.  Motor: Motor function is intact. No pronator drift.  Coordination: Coordination is intact. Romberg sign negative.  Gait: Gait is intact.  Deep Tendon Reflexes: Reflexes are normal and symmetric.   Lab Results Last 24 Hours    No results found.     ASSESSMENT and PLAN  1. New daily persistent headache  Frequency mildly improved but not intensity. Etiology unclear yet. Given new tinnitus and occular pain, concern for IIH arises again, I have advised patient to have repeat eye exam. MRA to r/o aneurysm  Other orders  - topiramate (TOPAMAX) 100 MG tablet; Take 1 tablet (100 mg total) by mouth at bedtime.  - amitriptyline (ELAVIL) 10 MG tablet; Take 1 tablet (10 mg total) by mouth at bedtime.   Followup in 4 weeks  [place], MD  Primary Care at [place]  [place], [place] [address]  Ph. [phone]  Fax [phone]

## 2019-03-24 NOTE — Patient Instructions (Addendum)
  [facility] [place]  [phone]   Please make another appointment with eye doctor to evaluate for papilledema  If you have lab work done today you will be contacted with your lab results within the next 2 weeks.  If you have not heard from [place] then please contact us. The fastest way to get your results is to register for My Chart.   IF you received an x-ray today, you will receive an invoice from [facility]. Please contact [facility] at [phone] with questions or concerns regarding your invoice.   IF you received labwork today, you will receive an invoice from [facility]. Please contact [facility] at [phone] with questions or concerns regarding your invoice.   Our billing staff will not be able to assist you with questions regarding bills from these companies.  You will be contacted with the lab results as soon as they are available. The fastest way to get your results is to activate your My Chart account. Instructions are located on the last page of this paperwork. If you have not heard from [place] regarding the results in 2 weeks, please contact this office.

## 2019-03-25 ENCOUNTER — Telehealth: Payer: Self-pay | Admitting: Family Medicine

## 2019-03-25 NOTE — Telephone Encounter (Signed)
[facility] has sent over a request for you to send pt's ov notes , any labs, EKG's or MRI's, CT's scans. Does pt have BCBS or UHC?

## 2019-03-31 ENCOUNTER — Other Ambulatory Visit: Payer: Self-pay

## 2019-03-31 ENCOUNTER — Encounter: Payer: Self-pay | Admitting: Neurology

## 2019-03-31 ENCOUNTER — Ambulatory Visit (INDEPENDENT_AMBULATORY_CARE_PROVIDER_SITE_OTHER): Payer: No Typology Code available for payment source | Admitting: Neurology

## 2019-03-31 VITALS — BP 121/78 | HR 73 | Temp 97.4°F | Ht 64.0 in | Wt 280.5 lb

## 2019-03-31 DIAGNOSIS — Z820 Family history of epilepsy and other diseases of the nervous system: Secondary | ICD-10-CM

## 2019-03-31 DIAGNOSIS — R519 Headache, unspecified: Secondary | ICD-10-CM

## 2019-03-31 DIAGNOSIS — R0683 Snoring: Secondary | ICD-10-CM | POA: Diagnosis not present

## 2019-03-31 DIAGNOSIS — R0681 Apnea, not elsewhere classified: Secondary | ICD-10-CM

## 2019-03-31 DIAGNOSIS — Z9189 Other specified personal risk factors, not elsewhere classified: Secondary | ICD-10-CM

## 2019-03-31 DIAGNOSIS — G4719 Other hypersomnia: Secondary | ICD-10-CM

## 2019-03-31 DIAGNOSIS — Z6841 Body Mass Index (BMI) 40.0 and over, adult: Secondary | ICD-10-CM

## 2019-03-31 NOTE — Progress Notes (Signed)
Subjective:    Patient ID: Felicia Salinas is a 29 y.o. female.  HPI     Huston Foley, MD, PhD [facility] [address] [address] [place], [place] [address]  Dear Dr. Leretha Pol, I saw your patient, Felicia Salinas, upon your kind request in my sleep clinic today for initial consultation of her sleep disorder, in particular, concern for underlying obstructive sleep apnea.  The patient is unaccompanied today.  As you know, Felicia Salinas is a 29 year old right-handed woman with an underlying medical history of allergies, asthma, recurrent headaches and morbid obesity with a BMI of over 45, who reports snoring and excessive daytime somnolence, as well as witnessed apneas per husband's report and frequent morning headaches.  I reviewed your office note from 03/24/2019.  She has been referred to the headache wellness center.  She has never had a sleep study.  Her Epworth sleepiness score is 14 out of 24, fatigue severity score is 40 out of 63.  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  She is currently working from home for [employer].  She is in bed generally around midnight and rise time is around 6 because she has to take the dogs out.  She has 2 dogs, they do not sleep in the bedroom with them.  She does have a TV on at night in her bedroom on a sleep timer.  She has no night to night nocturia but has woken up with a headache.  She had a tonsillectomy and adenoidectomy in sixth grade.  Her father has sleep apnea but she is unsure if he has a CPAP machine.  She is working on weight loss and has lost about 30 pounds thus far.  She is a non-smoker and does not utilize alcohol and does not drink caffeine on a daily basis.  She denies any telltale symptoms of restless leg syndrome but has been noted to twitch or kicking her sleep per husband's feedback to her.   Her Past Medical History Is Significant For: Past Medical History:  Diagnosis Date  . Anxiety   .  Asthma    controlled per patient last attack in January  . Environmental allergies   . Obesity     Her Past Surgical History Is Significant For: Past Surgical History:  Procedure Laterality Date  . APPENDECTOMY  04/07/2011  . CHOLECYSTECTOMY  10/18/2011   Procedure: LAPAROSCOPIC CHOLECYSTECTOMY WITH INTRAOPERATIVE CHOLANGIOGRAM;  Surgeon: Emelia Loron, MD;  Location: [facility] OR;  Service: General;  Laterality: N/A;  . TONSILLECTOMY    . WISDOM TOOTH EXTRACTION      Her Family History Is Significant For: Family History  Problem Relation Age of Onset  . Cancer Mother        breast and spinal CA  . Asthma Mother   . Healthy Father   . Breast cancer Maternal Grandmother   . Diabetes Maternal Grandmother   . Rheum arthritis Maternal Grandmother   . Ovarian cancer Paternal Aunt   . Ovarian cancer Maternal Aunt   . Diabetes Sister   . Diabetes Paternal Grandfather   . Hypertension Maternal Grandfather   . Colon cancer Neg Hx     Her Social History Is Significant For: Social History   Socioeconomic History  . Marital status: Married    Spouse name: Not on file  . Number of children: 0  . Years of education: Not on file  . Highest education level: Not on  file  Occupational History  . Occupation: Cabin crew: [employer]  Tobacco Use  . Smoking status: Never Smoker  . Smokeless tobacco: Never Used  Substance and Sexual Activity  . Alcohol use: Not Currently    Comment: Occasional  . Drug use: No  . Sexual activity: Yes    Birth control/protection: None  Other Topics Concern  . Not on file  Social History Narrative  . Not on file   Social Determinants of Health   Financial Resource Strain:   . Difficulty of Paying Living Expenses: Not on file  Food Insecurity:   . Worried About Programme researcher, broadcasting/film/video in the Last Year: Not on file  . Ran Out of Food in the Last Year: Not on file  Transportation Needs:   . Lack of Transportation (Medical): Not on file  . Lack  of Transportation (Non-Medical): Not on file  Physical Activity:   . Days of Exercise per Week: Not on file  . Minutes of Exercise per Session: Not on file  Stress:   . Feeling of Stress : Not on file  Social Connections:   . Frequency of Communication with Friends and Family: Not on file  . Frequency of Social Gatherings with Friends and Family: Not on file  . Attends Religious Services: Not on file  . Active Member of Clubs or Organizations: Not on file  . Attends Banker Meetings: Not on file  . Marital Status: Not on file    Her Allergies Are:  Allergies  Allergen Reactions  . Dairy Aid [Lactase] Anaphylaxis  . Shellfish Allergy Anaphylaxis  . Benadryl [Diphenhydramine Hcl] Hives  . Eggs Or Egg-Derived Products Hives and Rash  . Iodine Hives and Rash  . Tape Rash  :   Her Current Medications Are:  Outpatient Encounter Medications as of 03/31/2019  Medication Sig  . albuterol (VENTOLIN HFA) 108 (90 Base) MCG/ACT inhaler Inhale 2 puffs into the lungs every 6 (six) hours as needed for wheezing or shortness of breath.  Marland Kitchen amitriptyline (ELAVIL) 10 MG tablet Take 1 tablet (10 mg total) by mouth at bedtime.  . cetirizine (ZYRTEC) 10 MG tablet Take 10 mg by mouth daily.  Marland Kitchen escitalopram (LEXAPRO) 20 MG tablet Take 1 tablet (20 mg total) by mouth at bedtime.  . fluticasone (FLONASE) 50 MCG/ACT nasal spray Place 2 sprays into both nostrils daily as needed for allergies.  Marland Kitchen ondansetron (ZOFRAN) 8 MG tablet Take 1 tablet (8 mg total) by mouth every 8 (eight) hours as needed for nausea or vomiting.  . topiramate (TOPAMAX) 100 MG tablet Take 1 tablet (100 mg total) by mouth at bedtime.   No facility-administered encounter medications on file as of 03/31/2019.  :  Review of Systems:  Out of a complete 14 point review of systems, all are reviewed and negative with the exception of these symptoms as listed below: Review of Systems  Constitutional: Negative.   HENT: Negative.    Eyes: Negative.   Cardiovascular: Negative.   Gastrointestinal: Negative.   Endocrine: Negative.   Genitourinary: Negative.   Musculoskeletal: Positive for neck pain and neck stiffness.       Ankles swelling  Skin: Negative.   Allergic/Immunologic: Positive for food allergies.       Contrast. Benadryl, eggs, iodine, shellfish  Neurological: Positive for light-headedness and headaches.       Epworth Sleepiness Scale 0= would never doze 1= slight chance of dozing 2= moderate chance of dozing 3= high  chance of dozing   Sitting and reading:2 Watching TV:3 Sitting inactive in a public place (ex. Theater or meeting):2 As a passenger in a car for an hour without a break:2 Lying down to rest in the afternoon: 3 Sitting and talking to someone: 1 Sitting quietly after lunch (no alcohol):1 In a car, while stopped in traffic:0 Total:14   Hematological: Negative.   Psychiatric/Behavioral: Positive for agitation and sleep disturbance.    Objective:  Neurological Exam  Physical Exam Physical Examination:   Vitals:   03/31/19 0852  BP: 121/78  Pulse: 73  Temp: (!) 97.4 F (36.3 C)    General Examination: The patient is a very pleasant 29 y.o. female in no acute distress. She appears well-developed and well-nourished and well groomed.   HEENT: Normocephalic, atraumatic, pupils are equal, round and reactive to light, extraocular tracking is good without limitation to gaze excursion or nystagmus noted. Corrective eyeglasses in place. Hearing is grossly intact. Face is symmetric with normal facial animation. Speech is clear with no dysarthria noted. There is no hypophonia. There is no lip, neck/head, jaw or voice tremor. Neck is supple with full range of passive and active motion. There are no carotid bruits on auscultation. Oropharynx exam reveals: mild mouth dryness, adequate dental hygiene and moderate airway crowding, due to Mallampati class III, uvula not fully visualized, tonsils  absent, tongue protrudes centrally in palate elevates symmetrically, neck circumference of 16-1/8 inches.She has a mild overbite.   Chest: Clear to auscultation without wheezing, rhonchi or crackles noted.  Heart: S1+S2+0, regular and normal without murmurs, rubs or gallops noted.   Abdomen: Soft, non-tender and non-distended with normal bowel sounds appreciated on auscultation.  Extremities: There is no pitting edema in the distal lower extremities bilaterally.   Skin: Warm and dry without trophic changes noted.   Musculoskeletal: exam reveals no obvious joint deformities, tenderness or joint swelling or erythema.   Neurologically:  Mental status: The patient is awake, alert and oriented in all 4 spheres. HerHer immediate and remote memory, attention, language skills and fund of knowledge are appropriate. There is no evidence of aphasia, agnosia, apraxia or anomia. Speech is clear with normal prosody and enunciation. Thought process is linear. Mood is normal and affect is normal.  Cranial nerves II - XII are as described above under HEENT exam.  Motor exam: Normal bulk, strength and tone is noted. There is no tremor, Romberg is negative. Reflexes are 2+ throughout. Fine motor skills and coordination: grossly intact.  Cerebellar testing: No dysmetria or intention tremor. There is no truncal or gait ataxia.  Sensory exam: intact to light touch in the upper and lower extremities.  Gait, station and balance: She stands easily. No veering to one side is noted. No leaning to one side is noted. Posture is age-appropriate and stance is narrow based. Gait shows normal stride length and normal pace. No problems turning are noted. Tandem walk is unremarkable.                Assessment and Plan:  In summary, Aeva E Klemmer is a very pleasant 29 y.o.-year old female with an underlying medical history of allergies, asthma, recurrent headaches and morbid obesity with a BMI of over 45, whose history and  physical exam are concerning for obstructive sleep apnea (OSA). I had a long chat with the patient about my findings and the diagnosis of OSA, its prognosis and treatment options. We talked about medical treatments, surgical interventions and non-pharmacological approaches. I explained in  particular the risks and ramifications of untreated moderate to severe OSA, especially with respect to developing cardiovascular disease down the Road, including congestive heart failure, difficult to treat hypertension, cardiac arrhythmias, or stroke. Even type 2 diabetes has, in part, been linked to untreated OSA. Symptoms of untreated OSA include daytime sleepiness, memory problems, mood irritability and mood disorder such as depression and anxiety, lack of energy, as well as recurrent headaches, especially morning headaches. We talked about trying to maintain a healthy lifestyle in general, as well as the importance of weight control. We also talked about the importance of good sleep hygiene. I recommended the following at this time: sleep study.  I explained the sleep test procedure to the patient and also outlined possible surgical and non-surgical treatment options of OSA, including the use of a custom-made dental device (which would require a referral to a specialist dentist or oral surgeon), upper airway surgical options, such as traditional UPPP or a novel less invasive surgical option in the form of Inspire hypoglossal nerve stimulation (which would involve a referral to an ENT surgeon). I also explained the CPAP treatment option to the patient, who indicated that she would be willing to try CPAP if the need arises. I explained the importance of being compliant with PAP treatment, not only for insurance purposes but primarily to improve Her symptoms, and for the patient's long term health benefit, including to reduce Her cardiovascular risks. I answered all her questions today and the patient was in agreement. I plan  to see her back after the sleep study is completed and encouraged her to call with any interim questions, concerns, problems or updates.   Thank you very much for allowing me to participate in the care of this nice patient. If I can be of any further assistance to you please do not hesitate to call me at [phone].  Sincerely,   Huston Foley, MD, PhD

## 2019-03-31 NOTE — Patient Instructions (Signed)

## 2019-04-13 ENCOUNTER — Ambulatory Visit: Payer: No Typology Code available for payment source | Admitting: Neurology

## 2019-04-13 DIAGNOSIS — R0681 Apnea, not elsewhere classified: Secondary | ICD-10-CM

## 2019-04-13 DIAGNOSIS — Z9189 Other specified personal risk factors, not elsewhere classified: Secondary | ICD-10-CM

## 2019-04-13 DIAGNOSIS — Z820 Family history of epilepsy and other diseases of the nervous system: Secondary | ICD-10-CM

## 2019-04-13 DIAGNOSIS — G4733 Obstructive sleep apnea (adult) (pediatric): Secondary | ICD-10-CM

## 2019-04-13 DIAGNOSIS — R519 Headache, unspecified: Secondary | ICD-10-CM

## 2019-04-13 DIAGNOSIS — R0683 Snoring: Secondary | ICD-10-CM

## 2019-04-13 DIAGNOSIS — G4719 Other hypersomnia: Secondary | ICD-10-CM

## 2019-04-21 NOTE — Progress Notes (Signed)
Patient referred by Dr. Leretha Pol, seen by me on 03/31/19, HST on 04/13/19.    Please call and notify the patient that the recent home sleep test showed obstructive sleep apnea in the severe range. While I recommend treatment for this in the form CPAP, her insurance will not approve a sleep study for this. They will likely only approve a trial of autoPAP, which means, that we don't have to bring her in for a sleep study with CPAP, but will let her try an autoPAP machine at home, through a DME company (of her choice, or as per insurance requirement). The DME representative will educate her on how to use the machine, how to put the mask on, etc. I have placed an order in the chart. Please send referral, talk to patient, send report to referring MD. We will need a FU in sleep clinic for 10 weeks post-PAP set up, please arrange that with me or one of our NPs. Thanks,   Huston Foley, MD, PhD [facility])

## 2019-04-21 NOTE — Procedures (Signed)
Patient Information     First Name: Felicia Last Name: Salinas ID: [id number]  Birth Date: December 09, 1990 Age: 29 Gender: Female  Referring Provider: Rutherford Guys, MD BMI: 47.8 (W=280 lb, H=5' 4'')  Neck Circ.:  16 '' Epworth:  14/24   Sleep Study Information    Study Date: 04/13/2019 S/H/A Version: 001.001.001.001 / 4.1.1528 / 17  History:    29 year old woman with a history of allergies, asthma, recurrent headaches and morbid obesity with a BMI of over 45, who reports snoring and excessive daytime somnolence, as well as witnessed apneas per husband's report and frequent morning headaches.  Summary & Diagnosis:    OSA Recommendations:     This home sleep test demonstrates severe obstructive sleep apnea with a total AHI of 36.8/hour and O2 nadir of 76%. Treatment with positive airway pressure (in the form of CPAP) is recommended. This will require a full night CPAP titration study for proper treatment settings, O2 monitoring and mask fitting. Based on the severity of the sleep disordered breathing an attended titration study is indicated. However, patient's insurance has denied an attended sleep study; therefore, the patient will be advised to proceed with an autoPAP titration/trial at home for now. Please note that untreated obstructive sleep apnea may carry additional perioperative morbidity. Patients with significant obstructive sleep apnea should receive perioperative PAP therapy and the surgeons and particularly the anesthesiologist should be informed of the diagnosis and the severity of the sleep disordered breathing. The patient should be cautioned not to drive, work at heights, or operate dangerous or heavy equipment when tired or sleepy. Review and reiteration of good sleep hygiene measures should be pursued with any patient. Other causes of the patient's symptoms, including circadian rhythm disturbances, an underlying mood disorder, medication effect and/or an underlying medical problem cannot be  ruled out based on this test. Clinical correlation is recommended. The patient and his referring provider will be notified of the test results. The patient will be seen in follow up in sleep clinic at [facility].  I certify that I have reviewed the raw data recording prior to the issuance of this report in accordance with the standards of the American Academy of Sleep Medicine (AASM).  Star Age, MD, PhD [facility]) Diplomat, ABPN (Neurology and Sleep)                    Start Study Time: End Study Time: Total Recording Time:  8:21:59 PM 2:40:41 AM 6 h, 18 min  Total Sleep Time % REM of Sleep Time:  5 h, 9 min  19.5    Mean: 96 Minimum: 76 Maximum: 100  Mean of Desaturations Nadirs (%):   92  Oxygen Desaturation. %:   4-9 10-20 >20 Total  Events Number Total    80  6 93.0 7.0  0 0.0  86 100.0  Oxygen Saturation <90 <=88 <85 <80 <70  Duration (minutes): Sleep % 1.1 0.3  0.7 0.2  0.2 0.1 0.1 0.0 0.0 0.0     Respiratory Indices       Total Events REM NREM All Night  pRDI: pAHI: ODI:  173  147  86 67.2 64.0 41.6 38.8 31.7 17.8 43.3 36.8 21.5       Pulse Rate Statistics during Sleep (BPM)      Mean: 68 Minimum: N/A Maximum: 105    Oxygen Saturation Statistics   Sleep Summary   Indices are calculated using technically valid sleep time of  4 h, 50 min. pRDI/pAHI are calculated using oxi desaturations ? 3%                  Sleep Stages Chart                                                        pAHI=36.8                                                                    Mild              Moderate                    Severe                                                 5              15                    30

## 2019-04-21 NOTE — Addendum Note (Signed)
Addended by: Huston Foley on: 04/21/2019 01:23 PM   Modules accepted: Orders

## 2019-04-22 ENCOUNTER — Telehealth: Payer: Self-pay

## 2019-04-22 NOTE — Telephone Encounter (Signed)
I have sent a my chart message to the pt regarding sleep study results.  Pt advised to reach back out so we can further discuss.

## 2019-04-22 NOTE — Telephone Encounter (Signed)
Patient called stating she would like to use [facility] as her medical supplier and was advised that order would need to be sent with her OV and sleep study note.   [facility] # [id number]   Please follow up

## 2019-04-22 NOTE — Telephone Encounter (Signed)
-----   Message from Huston Foley, MD sent at 04/21/2019  1:23 PM EST ----- Patient referred by Dr. Leretha Pol, seen by me on 03/31/19, HST on 04/13/19.    Please call and notify the patient that the recent home sleep test showed obstructive sleep apnea in the severe range. While I recommend treatment for this in the form CPAP, her insurance will not approve a sleep study for this. They will likely only approve a trial of autoPAP, which means, that we don't have to bring her in for a sleep study with CPAP, but will let her try an autoPAP machine at home, through a DME company (of her choice, or as per insurance requirement). The DME representative will educate her on how to use the machine, how to put the mask on, etc. I have placed an order in the chart. Please send referral, talk to patient, send report to referring MD. We will need a FU in sleep clinic for 10 weeks post-PAP set up, please arrange that with me or one of our NPs. Thanks,   Huston Foley, MD, PhD [facility])

## 2019-04-22 NOTE — Telephone Encounter (Signed)
I reached out to the pt and advised we could use [facility] as DME.  Pt was advised I would send order and she has been schedule for initial f/u on 4/  29/2021 at 930.  Letter mailed to the pt as well with auto pap information included.

## 2019-04-23 ENCOUNTER — Ambulatory Visit (INDEPENDENT_AMBULATORY_CARE_PROVIDER_SITE_OTHER): Payer: No Typology Code available for payment source | Admitting: Family Medicine

## 2019-04-23 ENCOUNTER — Other Ambulatory Visit: Payer: Self-pay

## 2019-04-23 ENCOUNTER — Encounter: Payer: Self-pay | Admitting: Family Medicine

## 2019-04-23 VITALS — BP 122/83 | HR 69 | Temp 98.1°F | Ht 64.0 in | Wt 277.0 lb

## 2019-04-23 DIAGNOSIS — G4700 Insomnia, unspecified: Secondary | ICD-10-CM | POA: Diagnosis not present

## 2019-04-23 DIAGNOSIS — G4733 Obstructive sleep apnea (adult) (pediatric): Secondary | ICD-10-CM

## 2019-04-23 DIAGNOSIS — G4452 New daily persistent headache (NDPH): Secondary | ICD-10-CM | POA: Diagnosis not present

## 2019-04-23 NOTE — Patient Instructions (Signed)
° ° ° °  If you have lab work done today you will be contacted with your lab results within the next 2 weeks.  If you have not heard from us then please contact us. The fastest way to get your results is to register for My Chart. ° ° °IF you received an x-ray today, you will receive an invoice from [facility]. Please contact  Radiology at [phone] with questions or concerns regarding your invoice.  ° °IF you received labwork today, you will receive an invoice from [facility]. Please contact [facility] at [phone] with questions or concerns regarding your invoice.  ° °Our billing staff will not be able to assist you with questions regarding bills from these companies. ° °You will be contacted with the lab results as soon as they are available. The fastest way to get your results is to activate your My Chart account. Instructions are located on the last page of this paperwork. If you have not heard from us regarding the results in 2 weeks, please contact this office. °  ° ° ° °

## 2019-04-23 NOTE — Progress Notes (Signed)
1/28/202111:21 AM  Felicia [facility] 04-Aug-1990, 29 y.o., female [id number]  Chief Complaint  Patient presents with  . Migraine    follow up, says meds are not helping or migraine, has scan for head on Saturday. in the process for getting cpap machine    HPI:   Patient is a 29 y.o. female with past medical history significant for asthma, PCOS, OSA, anxiety and depression, who presents today for followup on headaches  Last OV a month ago Had positive sleep study for severe OSA - cpap being ordered via [facility] Referred to headache specialist - has appt for feb 16th Added elavil for sleep - increased to 30mg  and still not sleeping, she stopped Increased topimax to 100mg  for headaches - intensity and frequency are getting better She will be getting her new glasses and sunglasses next week Scheduled for MRA in 2 days  Depression screen [facility] 2/9 04/23/2019 03/24/2019 02/24/2019  Decreased Interest 0 0 0  Down, Depressed, Hopeless 0 0 0  PHQ - 2 Score 0 0 0    Fall Risk  04/23/2019 03/24/2019 02/24/2019 11/27/2018  Falls in the past year? 0 0 0 0  Number falls in past yr: 0 0 0 0  Injury with Fall? 0 0 1 0  Follow up - Falls evaluation completed - -     Allergies  Allergen Reactions  . Dairy Aid [Lactase] Anaphylaxis  . Shellfish Allergy Anaphylaxis  . Benadryl [Diphenhydramine Hcl] Hives  . Eggs Or Egg-Derived Products Hives and Rash  . Iodine Hives and Rash  . Tape Rash    Prior to Admission medications   Medication Sig Start Date End Date Taking? Authorizing Provider  albuterol (VENTOLIN HFA) 108 (90 Base) MCG/ACT inhaler Inhale 2 puffs into the lungs every 6 (six) hours as needed for wheezing or shortness of breath. 11/27/18  Yes Rutherford Guys, MD  amitriptyline (ELAVIL) 10 MG tablet Take 1 tablet (10 mg total) by mouth at bedtime. 03/24/19  Yes Rutherford Guys, MD  cetirizine (ZYRTEC) 10 MG tablet Take 10 mg by mouth daily. 05/01/16  Yes [provider]   escitalopram (LEXAPRO) 20 MG tablet Take 1 tablet (20 mg total) by mouth at bedtime. 02/24/19  Yes Rutherford Guys, MD  fluticasone [facility]) 50 MCG/ACT nasal spray Place 2 sprays into both nostrils daily as needed for allergies. 05/01/16  Yes [provider]  ondansetron (ZOFRAN) 8 MG tablet Take 1 tablet (8 mg total) by mouth every 8 (eight) hours as needed for nausea or vomiting. 02/10/19  Yes Rutherford Guys, MD  topiramate (TOPAMAX) 100 MG tablet Take 1 tablet (100 mg total) by mouth at bedtime. 03/24/19  Yes Rutherford Guys, MD    Past Medical History:  Diagnosis Date  . Anxiety   . Asthma    controlled per patient last attack in January  . Environmental allergies   . Obesity     Past Surgical History:  Procedure Laterality Date  . APPENDECTOMY  04/07/2011  . CHOLECYSTECTOMY  10/18/2011   Procedure: LAPAROSCOPIC CHOLECYSTECTOMY WITH INTRAOPERATIVE CHOLANGIOGRAM;  Surgeon: Rolm Bookbinder, MD;  Location: [facility];  Service: General;  Laterality: N/A;  . TONSILLECTOMY    . WISDOM TOOTH EXTRACTION      Social History   Tobacco Use  . Smoking status: Never Smoker  . Smokeless tobacco: Never Used  Substance Use Topics  . Alcohol use: Not Currently    Comment: Occasional    Family History  Problem Relation  Age of Onset  . Cancer Mother        breast and spinal CA  . Asthma Mother   . Healthy Father   . Breast cancer Maternal Grandmother   . Diabetes Maternal Grandmother   . Rheum arthritis Maternal Grandmother   . Ovarian cancer Paternal Aunt   . Ovarian cancer Maternal Aunt   . Diabetes Sister   . Diabetes Paternal Grandfather   . Hypertension Maternal Grandfather   . Colon cancer Neg Hx     ROS Per hpi  OBJECTIVE:  Today's Vitals   04/23/19 1045  BP: 122/83  Pulse: 69  Temp: 98.1 F (36.7 C)  SpO2: 100%  Weight: 277 lb (125.6 kg)  Height: 5\' 4"  (1.626 m)   Body mass index is 47.55 kg/m.  Wt Readings from Last 3 Encounters:  04/23/19 277  lb (125.6 kg)  03/31/19 280 lb 8 oz (127.2 kg)  03/24/19 278 lb 12.8 oz (126.5 kg)    Physical Exam Vitals and nursing note reviewed.  Constitutional:      Appearance: She is well-developed.  HENT:     Head: Normocephalic and atraumatic.  Eyes:     General: No scleral icterus.    Conjunctiva/sclera: Conjunctivae normal.     Pupils: Pupils are equal, round, and reactive to light.  Pulmonary:     Effort: Pulmonary effort is normal.  Musculoskeletal:     Cervical back: Neck supple.  Skin:    General: Skin is warm and dry.  Neurological:     Mental Status: She is alert and oriented to person, place, and time.     No results found for this or any previous visit (from the past 24 hour(s)).  No results found.   ASSESSMENT and PLAN  1. New daily persistent headache Improved on higher dose of Topamax, MRA and consult pending. Will defer to specialist for further eval and treatment  2. OSA (obstructive sleep apnea) Pending CPAP start. Managed by sleep  3. Insomnia, unspecified type Not responded to TCA or trazodone, will see if improves once on CPAP  Return in about 3 months (around 07/22/2019).    07/24/2019, MD Primary Care at [facility] [address] [place], [place] Ph.  [phone] Fax [phone]

## 2019-04-25 ENCOUNTER — Ambulatory Visit
Admission: RE | Admit: 2019-04-25 | Discharge: 2019-04-25 | Disposition: A | Discharge status: Home / Self Care | Payer: BLUE CROSS/BLUE SHIELD | Source: Ambulatory Visit | Attending: Family Medicine | Admitting: Family Medicine

## 2019-04-25 ENCOUNTER — Other Ambulatory Visit: Payer: Self-pay

## 2019-04-25 DIAGNOSIS — G4452 New daily persistent headache (NDPH): Secondary | ICD-10-CM

## 2019-04-27 ENCOUNTER — Other Ambulatory Visit: Payer: Self-pay | Admitting: Family Medicine

## 2019-04-27 ENCOUNTER — Telehealth: Payer: Self-pay | Admitting: Family Medicine

## 2019-04-27 DIAGNOSIS — I671 Cerebral aneurysm, nonruptured: Secondary | ICD-10-CM

## 2019-04-27 NOTE — Telephone Encounter (Signed)
Patient's concern/request has been addressed 

## 2019-04-27 NOTE — Telephone Encounter (Signed)
Pt wants to know the results of her recent mri. Please advise

## 2019-04-27 NOTE — Telephone Encounter (Signed)
MRI results are in computer. What would you like for me to advise the patient?

## 2019-04-30 NOTE — Telephone Encounter (Signed)
Pt called stating that [facility] informed her that they have not received the order for her cpap. Please advise.

## 2019-04-30 NOTE — Telephone Encounter (Signed)
I have sent second message to [facility] in regards to the order placed in epic.

## 2019-05-07 ENCOUNTER — Telehealth: Payer: Self-pay | Admitting: Family Medicine

## 2019-05-07 NOTE — Telephone Encounter (Signed)
Msg below  

## 2019-05-07 NOTE — Telephone Encounter (Signed)
Pt is wanting note from work for her headaches that she discussed with provider. Please advise.

## 2019-05-07 NOTE — Telephone Encounter (Signed)
Patient is wanting  a note from work due to her headache. Patient was last seen on 04/16/19

## 2019-05-08 ENCOUNTER — Other Ambulatory Visit: Payer: Self-pay | Admitting: Neurosurgery

## 2019-05-08 ENCOUNTER — Telehealth: Payer: Self-pay

## 2019-05-08 DIAGNOSIS — I671 Cerebral aneurysm, nonruptured: Secondary | ICD-10-CM

## 2019-05-08 NOTE — Telephone Encounter (Signed)
Pt called requesting letter for work due to headaches fmla related. After speaking to patient,  she is out of work until June 03, 2019. She will follow up as needed.

## 2019-05-11 ENCOUNTER — Other Ambulatory Visit: Payer: Self-pay | Admitting: Neurosurgery

## 2019-05-11 NOTE — Telephone Encounter (Signed)
Request has been addressed

## 2019-05-16 ENCOUNTER — Other Ambulatory Visit: Payer: Self-pay | Admitting: Specialist

## 2019-05-16 DIAGNOSIS — H05119 Granuloma of unspecified orbit: Secondary | ICD-10-CM

## 2019-05-18 ENCOUNTER — Other Ambulatory Visit: Payer: Self-pay | Admitting: Family Medicine

## 2019-05-26 ENCOUNTER — Other Ambulatory Visit (HOSPITAL_COMMUNITY): Payer: BLUE CROSS/BLUE SHIELD

## 2019-05-29 ENCOUNTER — Inpatient Hospital Stay (HOSPITAL_COMMUNITY): Admission: RE | Admit: 2019-05-29 | Payer: BLUE CROSS/BLUE SHIELD | Source: Ambulatory Visit

## 2019-05-29 ENCOUNTER — Other Ambulatory Visit (HOSPITAL_COMMUNITY)
Admission: RE | Admit: 2019-05-29 | Discharge: 2019-05-29 | Disposition: A | Discharge status: Home / Self Care | Payer: No Typology Code available for payment source | Source: Ambulatory Visit | Attending: Neurosurgery | Admitting: Neurosurgery

## 2019-05-29 DIAGNOSIS — Z20822 Contact with and (suspected) exposure to covid-19: Secondary | ICD-10-CM | POA: Diagnosis not present

## 2019-05-29 DIAGNOSIS — Z01812 Encounter for preprocedural laboratory examination: Secondary | ICD-10-CM | POA: Insufficient documentation

## 2019-05-29 LAB — SARS CORONAVIRUS 2 (TAT 6-24 HRS): SARS Coronavirus 2: NEGATIVE

## 2019-06-01 NOTE — H&P (Signed)
Chief Complaint   Questionable aneurysm   HPI   HPI: Felicia Salinas is a 29 y.o. female who was found to have a possible ACOM aneurysm noted on MRA during workup for headaches.  She presents today for diagnostic cerebral angiogram for further evaluation.  She is without any concerns.  She denies family history of intracranial aneurysms.  No personal history of smoking.  Patient Active Problem List   Diagnosis Date Noted  . Asthma 09/29/2018  . Polycystic ovary syndrome 09/29/2018  . Obesity (BMI 30-39.9) 04/11/2011    PMH: Past Medical History:  Diagnosis Date  . Anxiety   . Asthma    controlled per patient last attack in January  . Environmental allergies   . Obesity     PSH: Past Surgical History:  Procedure Laterality Date  . APPENDECTOMY  04/07/2011  . CHOLECYSTECTOMY  10/18/2011   Procedure: LAPAROSCOPIC CHOLECYSTECTOMY WITH INTRAOPERATIVE CHOLANGIOGRAM;  Surgeon: Emelia Loron, MD;  Location: [facility] OR;  Service: General;  Laterality: N/A;  . TONSILLECTOMY    . WISDOM TOOTH EXTRACTION      (Not in a hospital admission)   SH: Social History   Tobacco Use  . Smoking status: Never Smoker  . Smokeless tobacco: Never Used  Substance Use Topics  . Alcohol use: Not Currently    Comment: Occasional  . Drug use: No    MEDS: Prior to Admission medications   Medication Sig Start Date End Date Taking? Authorizing Provider  albuterol (VENTOLIN HFA) 108 (90 Base) MCG/ACT inhaler Inhale 2 puffs into the lungs every 6 (six) hours as needed for wheezing or shortness of breath. 11/27/18   Myles Lipps, MD  cetirizine (ZYRTEC) 10 MG tablet Take 10 mg by mouth daily. 05/01/16   [provider]  escitalopram (LEXAPRO) 20 MG tablet Take 1 tablet (20 mg total) by mouth at bedtime. 02/24/19   Myles Lipps, MD  fluticasone (FLONASE) 50 MCG/ACT nasal spray Place 2 sprays into both nostrils daily as needed for allergies. 05/01/16   [provider]    ondansetron (ZOFRAN) 8 MG tablet Take 1 tablet (8 mg total) by mouth every 8 (eight) hours as needed for nausea or vomiting. 02/10/19   Myles Lipps, MD  topiramate (TOPAMAX) 100 MG tablet TAKE 1 TABLET(100 MG) BY MOUTH AT BEDTIME 05/20/19   Myles Lipps, MD    ALLERGY: Allergies  Allergen Reactions  . Dairy Aid [Lactase] Anaphylaxis  . Shellfish Allergy Anaphylaxis  . Benadryl [Diphenhydramine Hcl] Hives  . Eggs Or Egg-Derived Products Hives and Rash  . Iodine Hives and Rash  . Tape Rash    Social History   Tobacco Use  . Smoking status: Never Smoker  . Smokeless tobacco: Never Used  Substance Use Topics  . Alcohol use: Not Currently    Comment: Occasional     Family History  Problem Relation Age of Onset  . Cancer Mother        breast and spinal CA  . Asthma Mother   . Healthy Father   . Breast cancer Maternal Grandmother   . Diabetes Maternal Grandmother   . Rheum arthritis Maternal Grandmother   . Ovarian cancer Paternal Aunt   . Ovarian cancer Maternal Aunt   . Diabetes Sister   . Diabetes Paternal Grandfather   . Hypertension Maternal Grandfather   . Colon cancer Neg Hx      ROS   ROS  Exam   There were no vitals filed for  this visit. General appearance: WDWN, NAD Eyes: No scleral injection Cardiovascular: Regular rate and rhythm without murmurs, rubs, gallops. No edema or variciosities. Distal pulses normal. Pulmonary: Effort normal, non-labored breathing Musculoskeletal:     Muscle tone upper extremities: Normal    Muscle tone lower extremities: Normal    Motor exam: Upper Extremities Deltoid Bicep Tricep Grip  Right 5/5 5/5 5/5 5/5  Left 5/5 5/5 5/5 5/5   Lower Extremity IP Quad PF DF EHL  Right 5/5 5/5 5/5 5/5 5/5  Left 5/5 5/5 5/5 5/5 5/5   Neurological Mental Status:    - Patient is awake, alert, oriented to person, place, month, year, and situation    - Patient is able to give a clear and coherent history.    - No signs of  aphasia or neglect Cranial Nerves    - II: Visual Fields are full. PERRL    - III/IV/VI: EOMI without ptosis or diploplia.     - V: Facial sensation is grossly normal    - VII: Facial movement is symmetric.     - VIII: hearing is intact to voice    - X: Uvula elevates symmetrically    - XI: Shoulder shrug is symmetric.    - XII: tongue is midline without atrophy or fasciculations.  Sensory: Sensation grossly intact to LT  Results - Imaging/Labs   No results found for this or any previous visit (from the past 48 hour(s)).  No results found.  IMAGING: MR angiogram of the brain reviewed, it shows normal intracranial vasculature, however at the left A1 2 junction there is a medial projecting vascular structure that is either a loop of the A-comm complex or potentially small aneurysm.  Impression/Plan   29 y.o. 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  We will proceed with diagnostic cerebral angiogram for further evaluation.  Risks, benefits and alternatives were discussed. Patient stated understanding and wished to proceed.  Ferne Reus, PA-C [place] [facility] [employer]

## 2019-06-02 ENCOUNTER — Ambulatory Visit (HOSPITAL_COMMUNITY)
Admission: RE | Admit: 2019-06-02 | Discharge: 2019-06-02 | Disposition: A | Discharge status: Home / Self Care | Payer: No Typology Code available for payment source | Source: Ambulatory Visit | Attending: Neurosurgery | Admitting: Neurosurgery

## 2019-06-02 ENCOUNTER — Other Ambulatory Visit: Payer: Self-pay

## 2019-06-02 ENCOUNTER — Other Ambulatory Visit: Payer: Self-pay | Admitting: Neurosurgery

## 2019-06-02 DIAGNOSIS — I671 Cerebral aneurysm, nonruptured: Secondary | ICD-10-CM | POA: Diagnosis not present

## 2019-06-02 DIAGNOSIS — J45909 Unspecified asthma, uncomplicated: Secondary | ICD-10-CM | POA: Insufficient documentation

## 2019-06-02 DIAGNOSIS — E282 Polycystic ovarian syndrome: Secondary | ICD-10-CM | POA: Diagnosis not present

## 2019-06-02 DIAGNOSIS — E669 Obesity, unspecified: Secondary | ICD-10-CM | POA: Diagnosis not present

## 2019-06-02 DIAGNOSIS — Z79899 Other long term (current) drug therapy: Secondary | ICD-10-CM | POA: Insufficient documentation

## 2019-06-02 DIAGNOSIS — Z6841 Body Mass Index (BMI) 40.0 and over, adult: Secondary | ICD-10-CM | POA: Diagnosis not present

## 2019-06-02 DIAGNOSIS — Z888 Allergy status to other drugs, medicaments and biological substances status: Secondary | ICD-10-CM | POA: Insufficient documentation

## 2019-06-02 DIAGNOSIS — F419 Anxiety disorder, unspecified: Secondary | ICD-10-CM | POA: Insufficient documentation

## 2019-06-02 HISTORY — PX: IR US GUIDE VASC ACCESS RIGHT: IMG2390

## 2019-06-02 HISTORY — PX: IR ANGIO INTRA EXTRACRAN SEL COM CAROTID INNOMINATE UNI R MOD SED: IMG5359

## 2019-06-02 HISTORY — PX: IR ANGIO VERTEBRAL SEL VERTEBRAL UNI R MOD SED: IMG5368

## 2019-06-02 HISTORY — PX: IR ANGIO INTRA EXTRACRAN SEL INTERNAL CAROTID UNI L MOD SED: IMG5361

## 2019-06-02 LAB — CBC WITH DIFFERENTIAL/PLATELET
Abs Immature Granulocytes: 0.03 10*3/uL (ref 0.00–0.07)
Basophils Absolute: 0 10*3/uL (ref 0.0–0.1)
Basophils Relative: 0 %
Eosinophils Absolute: 0 10*3/uL (ref 0.0–0.5)
Eosinophils Relative: 0 %
HCT: 33.8 % — ABNORMAL LOW (ref 36.0–46.0)
Hemoglobin: 10.8 g/dL — ABNORMAL LOW (ref 12.0–15.0)
Immature Granulocytes: 0 %
Lymphocytes Relative: 22 %
Lymphs Abs: 2.4 10*3/uL (ref 0.7–4.0)
MCH: 29.3 pg (ref 26.0–34.0)
MCHC: 32 g/dL (ref 30.0–36.0)
MCV: 91.8 fL (ref 80.0–100.0)
Monocytes Absolute: 0.9 10*3/uL (ref 0.1–1.0)
Monocytes Relative: 8 %
Neutro Abs: 7.6 10*3/uL (ref 1.7–7.7)
Neutrophils Relative %: 70 %
Platelets: 387 10*3/uL (ref 150–400)
RBC: 3.68 MIL/uL — ABNORMAL LOW (ref 3.87–5.11)
RDW: 13.4 % (ref 11.5–15.5)
WBC: 10.9 10*3/uL — ABNORMAL HIGH (ref 4.0–10.5)
nRBC: 0 % (ref 0.0–0.2)

## 2019-06-02 LAB — BASIC METABOLIC PANEL
Anion gap: 8 (ref 5–15)
BUN: 13 mg/dL (ref 6–20)
CO2: 25 mmol/L (ref 22–32)
Calcium: 9.4 mg/dL (ref 8.9–10.3)
Chloride: 107 mmol/L (ref 98–111)
Creatinine, Ser: 0.89 mg/dL (ref 0.44–1.00)
GFR calc Af Amer: >60 mL/min (ref >60)
GFR calc non Af Amer: >60 mL/min (ref >60)
Glucose, Bld: 96 mg/dL (ref 70–99)
Potassium: 4.2 mmol/L (ref 3.5–5.1)
Sodium: 140 mmol/L (ref 135–145)

## 2019-06-02 LAB — PROTIME-INR
INR: 0.9 (ref 0.8–1.2)
Prothrombin Time: 12.3 seconds (ref 11.4–15.2)

## 2019-06-02 LAB — URINALYSIS, ROUTINE W REFLEX MICROSCOPIC
Bilirubin Urine: NEGATIVE
Glucose, UA: NEGATIVE mg/dL
Ketones, ur: NEGATIVE mg/dL
Leukocytes,Ua: NEGATIVE
Nitrite: NEGATIVE
Protein, ur: 30 mg/dL — AB
RBC / HPF: >50 RBC/hpf — ABNORMAL HIGH (ref 0–5)
Specific Gravity, Urine: 1.017 (ref 1.005–1.030)
pH: 6 (ref 5.0–8.0)

## 2019-06-02 LAB — NO BLOOD PRODUCTS

## 2019-06-02 LAB — APTT: aPTT: 28 seconds (ref 24–36)

## 2019-06-02 LAB — PREGNANCY, URINE: Preg Test, Ur: NEGATIVE

## 2019-06-02 MED ORDER — FENTANYL CITRATE (PF) 100 MCG/2ML IJ SOLN
INTRAMUSCULAR | Status: AC | PRN
  Administered 2019-06-02: 25 ug via INTRAVENOUS

## 2019-06-02 MED ORDER — FAMOTIDINE IN NACL 20-0.9 MG/50ML-% IV SOLN
INTRAVENOUS | Status: AC
  Administered 2019-06-02: 20 mg
  Filled 2019-06-02: qty 50

## 2019-06-02 MED ORDER — HEPARIN SODIUM (PORCINE) 1000 UNIT/ML IJ SOLN
INTRAMUSCULAR | Status: AC
  Filled 2019-06-02: qty 1

## 2019-06-02 MED ORDER — METHYLPREDNISOLONE SODIUM SUCC 125 MG IJ SOLR
INTRAMUSCULAR | Status: AC
  Administered 2019-06-02: 125 mg
  Filled 2019-06-02: qty 2

## 2019-06-02 MED ORDER — FAMOTIDINE IN NACL 20-0.9 MG/50ML-% IV SOLN: 20.0000 mg | Freq: Once | INTRAVENOUS | Status: DC

## 2019-06-02 MED ORDER — FENTANYL CITRATE (PF) 100 MCG/2ML IJ SOLN
INTRAMUSCULAR | Status: AC
  Filled 2019-06-02: qty 2

## 2019-06-02 MED ORDER — LIDOCAINE HCL 1 % IJ SOLN
INTRAMUSCULAR | Status: AC | PRN
  Administered 2019-06-02: 10 mL

## 2019-06-02 MED ORDER — FAMOTIDINE 10 MG PO TABS
10.0000 mg | ORAL_TABLET | Freq: Once | ORAL | Status: DC
  Filled 2019-06-02: qty 1

## 2019-06-02 MED ORDER — HEPARIN SODIUM (PORCINE) 1000 UNIT/ML IJ SOLN
INTRAMUSCULAR | Status: AC | PRN
  Administered 2019-06-02: 3000 [IU] via INTRAVENOUS

## 2019-06-02 MED ORDER — MIDAZOLAM HCL 2 MG/2ML IJ SOLN
INTRAMUSCULAR | Status: AC | PRN
  Administered 2019-06-02: 1 mg via INTRAVENOUS

## 2019-06-02 MED ORDER — LIDOCAINE HCL 1 % IJ SOLN
INTRAMUSCULAR | Status: AC
  Filled 2019-06-02: qty 20

## 2019-06-02 MED ORDER — MIDAZOLAM HCL 2 MG/2ML IJ SOLN
INTRAMUSCULAR | Status: AC
  Filled 2019-06-02: qty 2

## 2019-06-02 MED ORDER — VERAPAMIL HCL 2.5 MG/ML IV SOLN
INTRAVENOUS | Status: AC
  Administered 2019-06-02: 2.5 mg
  Filled 2019-06-02: qty 2

## 2019-06-02 MED ORDER — HYDROCODONE-ACETAMINOPHEN 5-325 MG PO TABS
1.0000 | ORAL_TABLET | ORAL | Status: DC | PRN
  Administered 2019-06-02: 1 via ORAL
  Filled 2019-06-02: qty 1

## 2019-06-02 MED ORDER — SODIUM CHLORIDE 0.9 % IV SOLN: INTRAVENOUS | Status: DC

## 2019-06-02 MED ORDER — CEFAZOLIN SODIUM-DEXTROSE 2-4 GM/100ML-% IV SOLN: 2.0000 g | INTRAVENOUS | Status: DC

## 2019-06-02 MED ORDER — METHYLPREDNISOLONE SODIUM SUCC 125 MG IJ SOLR: 125.0000 mg | Freq: Once | INTRAMUSCULAR | Status: DC

## 2019-06-02 MED ORDER — IOHEXOL 300 MG/ML  SOLN
100.0000 mL | Freq: Once | INTRAMUSCULAR | Status: AC | PRN
  Administered 2019-06-02: 50 mL via INTRA_ARTERIAL

## 2019-06-02 MED ORDER — NITROGLYCERIN 1 MG/10 ML FOR IR/CATH LAB
INTRA_ARTERIAL | Status: AC
  Administered 2019-06-02: 500 ug
  Filled 2019-06-02: qty 10

## 2019-06-02 NOTE — Brief Op Note (Signed)
  NEUROSURGERY BRIEF OPERATIVE  NOTE   PREOP DX: Cerebral aneurysm  POSTOP DX: Same  PROCEDURE: Diagnostic cerebral angiogram  SURGEON: Dr. Lisbeth Renshaw, MD  ANESTHESIA: IV Sedation with Local  EBL: Minimal  SPECIMENS: None  COMPLICATIONS: None  CONDITION: Stable to recovery  FINDINGS (Full report in [facility]): 1. Normal diagnostic cerebral angiogram. No aneurysm seen at the anterior communicating artery.

## 2019-06-02 NOTE — Discharge Instructions (Signed)
Radial Site Care  This sheet gives you information about how to care for yourself after your procedure. Your health care provider may also give you more specific instructions. If you have problems or questions, contact your health care provider. What can I expect after the procedure? After the procedure, it is common to have:  Bruising and tenderness at the catheter insertion area. Follow these instructions at home: Medicines  Take over-the-counter and prescription medicines only as told by your health care provider. Insertion site care  Follow instructions from your health care provider about how to take care of your insertion site. Make sure you: ? Wash your hands with soap and water before you change your bandage (dressing). If soap and water are not available, use hand sanitizer. ? Change your dressing as told by your health care provider. ? Leave stitches (sutures), skin glue, or adhesive strips in place. These skin closures may need to stay in place for 2 weeks or longer. If adhesive strip edges start to loosen and curl up, you may trim the loose edges. Do not remove adhesive strips completely unless your health care provider tells you to do that.  Check your insertion site every day for signs of infection. Check for: ? Redness, swelling, or pain. ? Fluid or blood. ? Pus or a bad smell. ? Warmth.  Do not take baths, swim, or use a hot tub until your health care provider approves.  You may shower 24-48 hours after the procedure, or as directed by your health care provider. ? Remove the dressing and gently wash the site with plain soap and water. ? Pat the area dry with a clean towel. ? Do not rub the site. That could cause bleeding.  Do not apply powder or lotion to the site. Activity   For 24 hours after the procedure, or as directed by your health care provider: ? Do not flex or bend the affected arm. ? Do not push or pull heavy objects with the affected arm. ? Do not  drive yourself home from the hospital or clinic. You may drive 24 hours after the procedure unless your health care provider tells you not to. ? Do not operate machinery or power tools.  Do not lift anything that is heavier than 10 lb (4.5 kg), or the limit that you are told, until your health care provider says that it is safe.  Ask your health care provider when it is okay to: ? Return to work or school. ? Resume usual physical activities or sports. ? Resume sexual activity. General instructions  If the catheter site starts to bleed, raise your arm and put firm pressure on the site. If the bleeding does not stop, get help right away. This is a medical emergency.  If you went home on the same day as your procedure, a responsible adult should be with you for the first 24 hours after you arrive home.  Keep all follow-up visits as told by your health care provider. This is important. Contact a health care provider if:  You have a fever.  You have redness, swelling, or yellow drainage around your insertion site. Get help right away if:  You have unusual pain at the radial site.  The catheter insertion area swells very fast.  The insertion area is bleeding, and the bleeding does not stop when you hold steady pressure on the area.  Your arm or hand becomes pale, cool, tingly, or numb. These symptoms may represent a serious problem   that is an emergency. Do not wait to see if the symptoms will go away. Get medical help right away. Call your local emergency services (911 in the [place] Do not drive yourself to the hospital. Summary  After the procedure, it is common to have bruising and tenderness at the site.  Follow instructions from your health care provider about how to take care of your radial site wound. Check the wound every day for signs of infection.  Do not lift anything that is heavier than 10 lb (4.5 kg), or the limit that you are told, until your health care provider says  that it is safe. This information is not intended to replace advice given to you by your health care provider. Make sure you discuss any questions you have with your health care provider. Document Revised: 04/17/2017 Document Reviewed: 04/17/2017 Elsevier Patient Education  2020 [employer] Inc.  

## 2019-06-02 NOTE — Sedation Documentation (Signed)
TR band to right wrist @ 1302, 13cc

## 2019-06-04 ENCOUNTER — Other Ambulatory Visit: Payer: Self-pay

## 2019-06-04 ENCOUNTER — Telehealth: Payer: Self-pay | Admitting: Family Medicine

## 2019-06-04 ENCOUNTER — Telehealth: Payer: No Typology Code available for payment source | Admitting: Family Medicine

## 2019-06-10 ENCOUNTER — Other Ambulatory Visit: Payer: BLUE CROSS/BLUE SHIELD

## 2019-07-09 ENCOUNTER — Ambulatory Visit: Payer: No Typology Code available for payment source | Attending: Internal Medicine

## 2019-07-09 ENCOUNTER — Ambulatory Visit
Admission: RE | Admit: 2019-07-09 | Discharge: 2019-07-09 | Disposition: A | Discharge status: Home / Self Care | Payer: No Typology Code available for payment source | Source: Ambulatory Visit | Attending: Specialist | Admitting: Specialist

## 2019-07-09 ENCOUNTER — Other Ambulatory Visit: Payer: Self-pay

## 2019-07-09 DIAGNOSIS — H05119 Granuloma of unspecified orbit: Secondary | ICD-10-CM

## 2019-07-09 DIAGNOSIS — Z23 Encounter for immunization: Secondary | ICD-10-CM

## 2019-07-09 MED ORDER — GADOBENATE DIMEGLUMINE 529 MG/ML IV SOLN
20.0000 mL | Freq: Once | INTRAVENOUS | Status: AC | PRN
  Administered 2019-07-09: 20 mL via INTRAVENOUS

## 2019-07-09 NOTE — Progress Notes (Signed)
   Covid-19 Vaccination Clinic  Name:  Felicia Salinas    MRN: [id number] DOB: [birth year]  07/09/2019  Felicia Salinas was observed post Covid-19 immunization for 15 minutes without incident. She was provided with Vaccine Information Sheet and instruction to access the V-Safe system.   Felicia Salinas was instructed to call 911 with any severe reactions post vaccine: Marland Kitchen Difficulty breathing  . Swelling of face and throat  . A fast heartbeat  . A bad rash all over body  . Dizziness and weakness   Immunizations Administered    Name Date Dose VIS Date Route   Pfizer COVID-19 Vaccine 07/09/2019  9:48 AM 0.3 mL 03/06/2019 Intramuscular   Manufacturer: [employer], [employer]   Lot: [id number]   NDC: [address]-0

## 2019-07-22 ENCOUNTER — Telehealth: Payer: Self-pay

## 2019-07-22 NOTE — Telephone Encounter (Signed)
Spoke with pt asked her to bring auto pap to visit on 07/23/2019 with Dr. Frances Furbish.

## 2019-07-23 ENCOUNTER — Ambulatory Visit (INDEPENDENT_AMBULATORY_CARE_PROVIDER_SITE_OTHER): Payer: No Typology Code available for payment source | Admitting: Neurology

## 2019-07-23 ENCOUNTER — Other Ambulatory Visit: Payer: Self-pay

## 2019-07-23 ENCOUNTER — Encounter: Payer: Self-pay | Admitting: Neurology

## 2019-07-23 VITALS — BP 116/78 | HR 91 | Temp 97.3°F | Ht 64.0 in | Wt 290.0 lb

## 2019-07-23 DIAGNOSIS — G4733 Obstructive sleep apnea (adult) (pediatric): Secondary | ICD-10-CM

## 2019-07-23 DIAGNOSIS — Z789 Other specified health status: Secondary | ICD-10-CM

## 2019-07-23 NOTE — Progress Notes (Signed)
Subjective:    Patient ID: Felicia Salinas is a 29 y.o. female.  HPI     Interim history:   Ms. Felicia Salinas is a 29 year old right-handed woman with an underlying medical history of allergies, asthma, recurrent headaches and morbid obesity with a BMI of over 49, who Presents for follow-up consultation of her severe obstructive sleep apnea, after interim home sleep testing and starting AutoPap therapy.  The patient is unaccompanied today.  I first met her at the request of her primary care physician on 03/31/2019, at which time the patient reported snoring and excessive daytime somnolence as well as witnessed apneas.  She was advised to proceed with sleep study testing.  Her insurance denied a laboratory attended sleep study and therefore she was advised to proceed with a home sleep test. She had a home sleep test on 04/13/19, which indicated severe obstructive sleep apnea with a total AHI of 36.8/hour and O2 nadir of 76%. She was advised to start autoPAP therapy at home as her insurance did not approve an attended sleep study.   Today, 07/23/2019: I reviewed her autoPAP compliance data from 06/23/2019 through 07/22/2019, which is a total of 30 days, during which time she used her machine 17 days with percent use days greater than 4 hours at 53%, indicating suboptimal compliance, average usage of 5 hours and 38 minutes, residual AHI at goal at 0.2/h, leak in the higher acceptable range with the 95th percentile at 18.5 L/min, average pressure for the 95th percentile at 12.3 cm with a range of 7 cm to 13 cm.  In the initial 30 days, set up date was 05/13/2019, she was better with her compliance, percent use days greater than 4 hours in the first month was 70% which was adequate, average usage and the rest of the compliance download showed similar findings, leak was just a little bit less and average pressure was also just a little bit less at 11.8 cm.  She reports that she feels a little bit better but sometimes wakes  up with a headache after using her AutoPap.  She is struggling with tolerating the mask, she currently has a nasal mask.  The pressure sometimes is also bothersome.  She is willing to continue with treatment but is not quite fully adapted to it. She reports that sometimes she wakes up and the mask is off, she also admits that sometimes she just falls asleep without putting it on.  She is working on weight loss and has lost a little bit of weight.  The patient's allergies, current medications, family history, past medical history, past social history, past surgical history and problem list were reviewed and updated as appropriate.   Previously:   03/31/19: (She) reports snoring and excessive daytime somnolence, as well as witnessed apneas per husband's report and frequent morning headaches.  I reviewed your office note from 03/24/2019.  She has been referred to the [facility].  She has never had a sleep study.  Her Epworth sleepiness score is 14 out of 24, fatigue severity score is 40 out of 63.  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  She is currently working from home for [employer].  She is in bed generally around midnight and rise time is around 6 because she has to take the dogs out.  She has 2 dogs, they do not sleep in the bedroom with them.  She does have a TV on at night in her  bedroom on a sleep timer.  She has no night to night nocturia but has woken up with a headache.  She had a tonsillectomy and adenoidectomy in sixth grade.  Her father has sleep apnea but she is unsure if he has a CPAP machine.  She is working on weight loss and has lost about 30 pounds thus far.  She is a non-smoker and does not utilize alcohol and does not drink caffeine on a daily basis.  She denies any telltale symptoms of restless leg syndrome but has been noted to twitch or kicking her sleep per husband's feedback to her.    Her Past Medical History Is Significant For: Past Medical  History:  Diagnosis Date  . Anxiety   . Asthma    controlled per patient last attack in January  . Environmental allergies   . Obesity     Her Past Surgical History Is Significant For: Past Surgical History:  Procedure Laterality Date  . APPENDECTOMY  04/07/2011  . CHOLECYSTECTOMY  10/18/2011   Procedure: LAPAROSCOPIC CHOLECYSTECTOMY WITH INTRAOPERATIVE CHOLANGIOGRAM;  Surgeon: Rolm Bookbinder, MD;  Location: [facility];  Service: General;  Laterality: N/A;  . IR ANGIO INTRA EXTRACRAN SEL COM CAROTID INNOMINATE UNI R MOD SED  06/02/2019  . IR ANGIO INTRA EXTRACRAN SEL INTERNAL CAROTID UNI L MOD SED  06/02/2019  . IR ANGIO VERTEBRAL SEL VERTEBRAL UNI R MOD SED  06/02/2019  . IR US GUIDE VASC ACCESS RIGHT  06/02/2019  . TONSILLECTOMY    . WISDOM TOOTH EXTRACTION      Her Family History Is Significant For: Family History  Problem Relation Age of Onset  . Cancer Mother        breast and spinal CA  . Asthma Mother   . Healthy Father   . Breast cancer Maternal Grandmother   . Diabetes Maternal Grandmother   . Rheum arthritis Maternal Grandmother   . Ovarian cancer Paternal Aunt   . Ovarian cancer Maternal Aunt   . Diabetes Sister   . Diabetes Paternal Grandfather   . Hypertension Maternal Grandfather   . Colon cancer Neg Hx     Her Social History Is Significant For: Social History   Socioeconomic History  . Marital status: Married    Spouse name: Not on file  . Number of children: 0  . Years of education: Not on file  . Highest education level: Not on file  Occupational History  . Occupation: Stage manager: [employer]  Tobacco Use  . Smoking status: Never Smoker  . Smokeless tobacco: Never Used  Substance and Sexual Activity  . Alcohol use: Not Currently    Comment: Occasional  . Drug use: No  . Sexual activity: Yes    Birth control/protection: None  Other Topics Concern  . Not on file  Social History Narrative  . Not on file   Social Determinants of Health    Financial Resource Strain:   . Difficulty of Paying Living Expenses:   Food Insecurity:   . Worried About Charity fundraiser in the Last Year:   . Arboriculturist in the Last Year:   Transportation Needs:   . Film/video editor (Medical):   Marland Kitchen Lack of Transportation (Non-Medical):   Physical Activity:   . Days of Exercise per Week:   . Minutes of Exercise per Session:   Stress:   . Feeling of Stress :   Social Connections:   . Frequency of Communication with Friends and  Family:   . Frequency of Social Gatherings with Friends and Family:   . Attends Religious Services:   . Active Member of Clubs or Organizations:   . Attends Archivist Meetings:   Marland Kitchen Marital Status:     Her Allergies Are:  Allergies  Allergen Reactions  . Dairy Aid [Lactase] Anaphylaxis  . Shellfish Allergy Anaphylaxis  . Benadryl [Diphenhydramine Hcl] Hives  . Eggs Or Egg-Derived Products Hives and Rash  . Iodine Hives and Rash  . Tape Rash  :   Her Current Medications Are:  Outpatient Encounter Medications as of 07/23/2019  Medication Sig  . albuterol (VENTOLIN HFA) 108 (90 Base) MCG/ACT inhaler Inhale 2 puffs into the lungs every 6 (six) hours as needed for wheezing or shortness of breath.  . baclofen (LIORESAL) 10 MG tablet   . cetirizine (ZYRTEC) 10 MG tablet Take 10 mg by mouth daily.  Marland Kitchen escitalopram (LEXAPRO) 20 MG tablet Take 1 tablet (20 mg total) by mouth at bedtime.  . fluticasone (FLONASE) 50 MCG/ACT nasal spray Place 2 sprays into both nostrils daily as needed for allergies.  Marland Kitchen ZOLMitriptan (ZOMIG PO) Take by mouth.  . zonisamide (ZONEGRAN) 25 MG capsule Take 100 mg by mouth daily.  . [DISCONTINUED] ondansetron (ZOFRAN) 8 MG tablet Take 1 tablet (8 mg total) by mouth every 8 (eight) hours as needed for nausea or vomiting.  . [DISCONTINUED] topiramate (TOPAMAX) 100 MG tablet TAKE 1 TABLET(100 MG) BY MOUTH AT BEDTIME   No facility-administered encounter medications on file as of  07/23/2019.  :  Review of Systems:  Out of a complete 14 point review of systems, all are reviewed and negative with the exception of these symptoms as listed below:  Review of Systems  Neurological:       Initial autopap f/u. Start date was 05/13/2019. Pt reports she has not been using her machine every night but is trying. She sometimes will fall asleep in he living room and the pap machine is in her bed room. Reports the mask does hurt her nose and she wakes up with H/a most mornings.      Objective:  Neurological Exam  Physical Exam Physical Examination:   Vitals:   07/23/19 0936  BP: 116/78  Pulse: 91  Temp: (!) 97.3 F (36.3 C)  SpO2: 97%    General Examination: The patient is a very pleasant 29 y.o. female in no acute distress. She appears well-developed and well-nourished and well groomed.   HEENT: Normocephalic, atraumatic, pupils are equal, round and reactive to light, extraocular tracking is good. Corrective eyeglasses in place. Hearing is grossly intact. Face is symmetric with normal facial animation. R ptosis, stable.  Speech is clear with no dysarthria noted. There is no hypophonia. There is no lip, neck/head, jaw or voice tremor. Neck is supple with full range of passive and active motion. There are no carotid bruits on auscultation. Oropharynx exam reveals: mild mouth dryness, adequate dental hygiene and moderate airway crowding, tongue protrudes centrally in palate elevates symmetrically.   Chest: Clear to auscultation without wheezing, rhonchi or crackles noted.  Heart: S1+S2+0, regular and normal without murmurs, rubs or gallops noted.   Abdomen: Soft, non-tender and non-distended.  Extremities: There is no pitting edema in the distal lower extremities bilaterally.   Skin: Warm and dry without trophic changes noted.   Musculoskeletal: exam reveals no obvious joint deformities, tenderness or joint swelling or erythema.   Neurologically:  Mental  status: The patient is awake, alert  and oriented in all 4 spheres. HerHer immediate and remote memory, attention, language skills and fund of knowledge are appropriate. There is no evidence of aphasia, agnosia, apraxia or anomia. Speech is clear with normal prosody and enunciation. Thought process is linear. Mood is normal and affect is normal.  Cranial nerves II - XII are as described above under HEENT exam.  Motor exam: Normal bulk, strength and tone is noted. There is no tremor, fine motor skills and coordination: grossly intact.  Cerebellar testing: No dysmetria or intention tremor. There is no truncal or gait ataxia.  Sensory exam: intact to light touch in the upper and lower extremities.  Gait, station and balance: She stands easily. No veering to one side is noted. No leaning to one side is noted. Posture is age-appropriate and stance is narrow based. Gait shows normal stride length and normal pace. No problems turning are noted.  Assessment and Plan:  In summary, Aubria E Takach is a very pleasant 29 year old female with an underlying medical history of allergies, asthma, recurrent headaches and morbid obesity with a BMI of over 68, who presents for FU consultation of her OSA, which was in the severe range by home sleep testing in January 2021. She has been on autoPAP therapy and was compliant with treatment in the first month, but in the past month has been suboptimal with her compliance. she is struggling with tolerance of the mask and pressure. She is motivated to continue with treatment and reports some benefit from it, but has sometimes woken up with a headache.  Her apnea scores are at goal.  She is commended on her effort for treating her sleep apnea with AutoPap therapy.  I asked him to be fully compliant with her AutoPap, but I also suggested that we bring her in for a full night titration study as she has had some difficulty with the treatment and may benefit from a proper titration and  actually using CPAP versus AutoPap.  If we are able to bring her in, we can also fit her with a interface that she may like better.  We would be able to monitor her oxygen saturations properly as well.  She did have some severe desaturations during the home sleep test.  To that end, we will call her to schedule her titration study and in the interim she is advised to be consistent with her AutoPap.  I plan to see her back after testing.  I answered all her questions today and she was in agreement.  I spent 30 minutes in total face-to-face time and in reviewing records during pre-charting, more than 50% of which was spent in counseling and coordination of care, reviewing test results, reviewing medications and treatment regimen and/or in discussing or reviewing the diagnosis of OSA, the prognosis and treatment options. Pertinent laboratory and imaging test results that were available during this visit with the patient were reviewed by me and considered in my medical decision making (see chart for details).

## 2019-07-23 NOTE — Patient Instructions (Signed)
As discussed, your home sleep test showed evidence of obstructive sleep apnea in the severe range.  I would like for you to come back for a proper, in lab sleep study for CPAP titration, meaning that we will use CPAP during that study and try to determine the right pressure for you with the right style of mask, since you are struggling with the autoPAP and your current nasal mask/interface.  I have already ordered your sleep study and we will check with your insurance for authorization; the sleep lab will call you to schedule your overnight test.  We will see you back after your next sleep study. In the meantime, continue to work on weight loss. Please continue using your autoPAP regularly. While your insurance requires that you use PAP at least 4 hours each night on 70% of the nights, I recommend, that you not skip any nights and use it throughout the night if you can. Getting used to PAP and staying with the treatment long term does take time and patience and discipline. Untreated obstructive sleep apnea when it is moderate to severe can have an adverse impact on cardiovascular health and raise her risk for heart disease, arrhythmias, hypertension, congestive heart failure, stroke and diabetes. Untreated obstructive sleep apnea causes sleep disruption, nonrestorative sleep, and sleep deprivation. This can have an impact on your day to day functioning and cause daytime sleepiness and impairment of cognitive function, memory loss, mood disturbance, and problems focussing. Using PAP regularly can improve these symptoms.

## 2019-07-24 ENCOUNTER — Ambulatory Visit: Payer: No Typology Code available for payment source | Admitting: Family Medicine

## 2019-08-03 ENCOUNTER — Ambulatory Visit: Payer: No Typology Code available for payment source | Attending: Internal Medicine

## 2019-08-03 DIAGNOSIS — Z23 Encounter for immunization: Secondary | ICD-10-CM

## 2019-08-03 NOTE — Progress Notes (Signed)
   Covid-19 Vaccination Clinic  Name:  Felicia Salinas    MRN: [id number] DOB: 09-13-1990  08/03/2019  Felicia Salinas was observed post Covid-19 immunization for 30 minutes based on pre-vaccination screening without incident. She was provided with Vaccine Information Sheet and instruction to access the V-Safe system.   Felicia Salinas was instructed to call 911 with any severe reactions post vaccine: Marland Kitchen Difficulty breathing  . Swelling of face and throat  . A fast heartbeat  . A bad rash all over body  . Dizziness and weakness   Immunizations Administered    Name Date Dose VIS Date Route   Pfizer COVID-19 Vaccine 08/03/2019  9:44 AM 0.3 mL 05/20/2018 Intramuscular   Manufacturer: [employer], [employer]   Lot: [id number]   NDC: [address]-4

## 2019-08-04 ENCOUNTER — Ambulatory Visit: Payer: No Typology Code available for payment source | Admitting: Family Medicine

## 2019-08-05 ENCOUNTER — Encounter: Payer: Self-pay | Admitting: Family Medicine

## 2019-08-10 ENCOUNTER — Telehealth: Payer: Self-pay

## 2019-08-10 NOTE — Telephone Encounter (Signed)
LVM for pt to call me back to schedule sleep study  

## 2019-08-30 ENCOUNTER — Ambulatory Visit (INDEPENDENT_AMBULATORY_CARE_PROVIDER_SITE_OTHER): Payer: No Typology Code available for payment source | Admitting: Neurology

## 2019-08-30 DIAGNOSIS — Z789 Other specified health status: Secondary | ICD-10-CM

## 2019-08-30 DIAGNOSIS — G4733 Obstructive sleep apnea (adult) (pediatric): Secondary | ICD-10-CM | POA: Diagnosis not present

## 2019-08-30 DIAGNOSIS — G4720 Circadian rhythm sleep disorder, unspecified type: Secondary | ICD-10-CM

## 2019-09-10 NOTE — Progress Notes (Signed)
Patient had a CPAP titration study on 08/30/19. She had HST in Jan and has been struggling with her autoPAP, last seen on 07/23/19.  Please call and inform patient that I have entered an order for treatment with positive airway pressure (PAP) treatment for obstructive sleep apnea (OSA). She did well during the latest sleep study with CPAP. We will, therefore, arrange for a machine for home use through her DME (durable medical equipment) company. She will need FU in about 3 months.   Thanks,   Huston Foley, MD, PhD [facility])

## 2019-09-10 NOTE — Procedures (Signed)
PATIENTS NAME:  Felicia Salinas, Arterburn DOB:      10/15/1990      MR#:    [id number]     DATE OF RECORDING: 08/30/2019 REFERRING M.D.:  Koren Shiver, MD Study Performed:   CPAP  Titration HISTORY: 29 year old woman with a history of allergies, asthma, recurrent headaches and morbid obesity with a BMI of over 45, who presents for a full night titration study. She has been on autoPAP therapy but has difficulty tolerating it. Her home sleep test demonstrated severe obstructive sleep apnea with a total AHI of 36.8/hour and O2 nadir of 76%. The patient endorsed the Epworth Sleepiness Scale at 14/24 points. The patients weight 290 pounds with a height of 64 (inches), resulting in a BMI of 49.7 kg/m2.   CURRENT MEDICATIONS: Ventolin, Lioresal, Zyrtec, Lexapro, Flonase, Zomig, Zonegran.   PROCEDURE:  This is a multichannel digital polysomnogram utilizing the SomnoStar 11.2 system.  Electrodes and sensors were applied and monitored per AASM Specifications.   EEG, EOG, Chin and Limb EMG, were sampled at 200 Hz.  ECG, Snore and Nasal Pressure, Thermal Airflow, Respiratory Effort, CPAP Flow and Pressure, Oximetry was sampled at 50 Hz. Digital video and audio were recorded.      The patient was fitted with a large Simplus FFM. CPAP was initiated at 5 cmH20 with heated humidity per AASM standards and pressure was advanced to 13 cmH20 because of hypopneas, apneas and desaturations.  At a PAP pressure of 13 cmH20, there was a reduction of the AHI to 0/hour with non-supine REM sleep achieved and O2 nadir of 96%.  Lights Out was at 22:05 and Lights On at 05:00. Total recording time (TRT) was 416 minutes, with a total sleep time (TST) of 372.5 minutes. The patients sleep latency was 31 minutes. REM latency was 209 minutes, which is delayed. The sleep efficiency was 89.5%.    SLEEP ARCHITECTURE: WASO (Wake after sleep onset) was 12 minutes with minimal to mild sleep fragmentation noted. There were 5 minutes in Stage N1,  232.5 minutes Stage N2, 78.5 minutes Stage N3 and 56.5 minutes in Stage REM.  The percentage of Stage N1 was 1.3%, Stage N2 was 62.4%, which is mildly increased, Stage N3 was 21.1% and Stage R (REM sleep) was 15.2%, which is mildly reduced. The arousals were noted as: 47 were spontaneous, 0 were associated with PLMs, 8 were associated with respiratory events.  RESPIRATORY ANALYSIS:  There was a total of 18 respiratory events: 5 obstructive apneas, 4 central apneas and 0 mixed apneas with a total of 9 apneas and an apnea index (AI) of 1.4 /hour. There were 9 hypopneas with a hypopnea index of 1.4/hour. The patient also had 0 respiratory event related arousals (RERAs).      The total APNEA/HYPOPNEA INDEX  (AHI) was 2.9 /hour and the total RESPIRATORY DISTURBANCE INDEX was 2.9 /hour  2 events occurred in REM sleep and 16 events in NREM. The REM AHI was 2.1 /hour versus a non-REM AHI of 3. /hour.  The patient spent 225 minutes of total sleep time in the supine position and 148 minutes in non-supine. The supine AHI was 3.5, versus a non-supine AHI of 2.0.  OXYGEN SATURATION & C02:  The baseline 02 saturation was 98%, with the lowest being 89%. Time spent below 89% saturation equaled 0 minutes.  PERIODIC LIMB MOVEMENTS:  The patient had a total of 0 Periodic Limb Movements. The Periodic Limb Movement (PLM) index was 0 and the PLM Arousal index was  0 /hour.  Audio and video analysis did not show any abnormal or unusual movements, behaviors, phonations or vocalizations. The patient took no bathroom breaks. The EKG was in keeping with normal sinus rhythm (NSR).  Post-study, the patient indicated that sleep was the same as usual.   DIAGNOSIS:  1. Obstructive Sleep Apnea  2. Dysfunctions associated with sleep stages or arousals from sleep  PLANS/RECOMMENDATIONS:  1. This study demonstrates resolution of the patient's obstructive sleep apnea with CPAP therapy. I will, therefore, start the patient on home  CPAP treatment at a pressure of 13 cm via large FFM with (heated) humidity. The patient should be reminded to be fully compliant with PAP therapy to improve sleep related symptoms and decrease long term cardiovascular risks. The patient should be reminded, that it may take up to 3 months to get fully used to using PAP with all planned sleep. The earlier full compliance is achieved, the better long term compliance tends to be. Please note that untreated obstructive sleep apnea may carry additional perioperative morbidity. Patients with significant obstructive sleep apnea should receive perioperative PAP therapy and the surgeons and particularly the anesthesiologist should be informed of the diagnosis and the severity of the sleep disordered breathing. 2. This study shows mild sleep fragmentation and mildly abnormal sleep stage percentages; these are nonspecific findings and per se do not signify an intrinsic sleep disorder or a cause for the patient's sleep-related symptoms. Causes include (but are not limited to) the first night effect of the sleep study, circadian rhythm disturbances, medication effect or an underlying mood disorder or medical problem.  3. The patient should be cautioned not to drive, work at heights, or operate dangerous or heavy equipment when tired or sleepy. Review and reiteration of good sleep hygiene measures should be pursued with any patient. 4. The patient will be seen in follow-up in the sleep clinic at [facility] for discussion of the test results, symptom and treatment compliance review, further management strategies, etc. The referring provider will be notified of the test results.  I certify that I have reviewed the entire raw data recording prior to the issuance of this report in accordance with the Standards of Accreditation of the American Academy of Sleep Medicine (AASM)   Star Age, MD,PhD [place] [facility](Neurology and Sleep  Medicine)

## 2019-09-10 NOTE — Addendum Note (Signed)
Addended by: Huston Foley on: 09/10/2019 04:19 PM   Modules accepted: Orders

## 2019-09-14 ENCOUNTER — Telehealth: Payer: No Typology Code available for payment source

## 2019-10-16 ENCOUNTER — Encounter: Payer: Self-pay | Admitting: Family Medicine

## 2019-10-16 ENCOUNTER — Other Ambulatory Visit: Payer: Self-pay

## 2019-10-16 ENCOUNTER — Ambulatory Visit (INDEPENDENT_AMBULATORY_CARE_PROVIDER_SITE_OTHER): Payer: No Typology Code available for payment source | Admitting: Family Medicine

## 2019-10-16 VITALS — BP 109/79 | HR 79 | Temp 98.2°F | Ht 64.0 in | Wt 290.0 lb

## 2019-10-16 DIAGNOSIS — G43711 Chronic migraine without aura, intractable, with status migrainosus: Secondary | ICD-10-CM | POA: Diagnosis not present

## 2019-10-16 DIAGNOSIS — G4700 Insomnia, unspecified: Secondary | ICD-10-CM | POA: Diagnosis not present

## 2019-10-16 DIAGNOSIS — G4733 Obstructive sleep apnea (adult) (pediatric): Secondary | ICD-10-CM | POA: Insufficient documentation

## 2019-10-16 DIAGNOSIS — F39 Unspecified mood [affective] disorder: Secondary | ICD-10-CM | POA: Diagnosis not present

## 2019-10-16 MED ORDER — QUETIAPINE FUMARATE 100 MG PO TABS: 50.0000 mg | ORAL_TABLET | Freq: Every day | ORAL | 2 refills | Status: DC

## 2019-10-16 NOTE — Patient Instructions (Signed)
° ° ° °  If you have lab work done today you will be contacted with your lab results within the next 2 weeks.  If you have not heard from us then please contact us. The fastest way to get your results is to register for My Chart. ° ° °IF you received an x-ray today, you will receive an invoice from [facility]. Please contact [facility] at [phone] with questions or concerns regarding your invoice.  ° °IF you received labwork today, you will receive an invoice from [facility]. Please contact [facility] at [phone] with questions or concerns regarding your invoice.  ° °Our billing staff will not be able to assist you with questions regarding bills from these companies. ° °You will be contacted with the lab results as soon as they are available. The fastest way to get your results is to activate your My Chart account. Instructions are located on the last page of this paperwork. If you have not heard from us regarding the results in 2 weeks, please contact this office. °  ° ° ° °

## 2019-10-16 NOTE — Progress Notes (Signed)
7/23/202110:40 AM  Felicia Salinas 1991-01-10, 29 y.o., female [id number]  Chief Complaint  Patient presents with  . Follow-up    Headache. Pt stated that she is still having the headaches and they are mor frequent and they are painful. She is getting trigger injections which one is scheduked for 10/19/2019    HPI:   Patient is a 29 y.o. female with past medical history significant for asthma, PCOS, OSA, anxiety and depression, who presents today for followup on headaches  Last OV Jan 2021 Being seen by headache specialist, Dr Neale Burly Had cpap titration study in June 2021 - changed from autopap to cpap 13cm H20  Headaches still daily but not always intense Taking zonegran daily, zomig as needed Doing trigger point injections in her head - she feels they are not very effective, first day she is out as she is having very sore head and for the rest of the week she is very sensitive to sound and light She is thinking about stopping them cpap is going ok, having issues with current mask Continues to have issues with maintaining sleep Unable to get back to sleep once she wakes up Failed: amitriptyline 30mg  and trazadone 100mg  Stopped taking lexapro 20mg  about a month ago as not helping with mood, she feels that it was making her more irritable/angrier  Was not sleeping better while on lexapro She feels that she however continues to be very irritable and sad  Depression screen [facility] 2/9 10/16/2019 10/16/2019 04/23/2019  Decreased Interest 2 0 0  Down, Depressed, Hopeless 3 0 0  PHQ - 2 Score 5 0 0  Altered sleeping 3 - -  Tired, decreased energy 3 - -  Change in appetite 3 - -  Feeling bad or failure about yourself  3 - -  Trouble concentrating 2 - -  Moving slowly or fidgety/restless 1 - -  Suicidal thoughts 3 - -  PHQ-9 Score 23 - -  Difficult doing work/chores Not difficult at all - -   When she gets overwhelmed, gets SI thoughts, denies any thoughts of plans, describes fleeting  thoughts, denies current intention reports intention once about a year ago, did not act  has never been hospitalized Has never seen psychiatry  GAD 7 : Generalized Anxiety Score 10/16/2019 10/16/2019 02/24/2019  Nervous, Anxious, on Edge 3 0 2  Control/stop worrying 3 0 1  Worry too much - different things 3 0 3  Trouble relaxing 3 0 3  Restless 2 0 0  Easily annoyed or irritable 3 0 3  Afraid - awful might happen 3 0 0  Total GAD 7 Score 20 0 12  Anxiety Difficulty Very difficult Not difficult at all -   MDQ9: q1 8 yes, q2, yes, q3 moderate  Fall Risk  10/16/2019 04/23/2019 03/24/2019 02/24/2019 11/27/2018  Falls in the past year? 0 0 0 0 0  Number falls in past yr: 0 0 0 0 0  Injury with Fall? 0 0 0 1 0  Follow up Falls evaluation completed - Falls evaluation completed - -     Allergies  Allergen Reactions  . Dairy Aid [Lactase] Anaphylaxis  . Shellfish Allergy Anaphylaxis  . Benadryl [Diphenhydramine Hcl] Hives  . Eggs Or Egg-Derived Products Hives and Rash  . Iodine Hives and Rash  . Tape Rash    Prior to Admission medications   Medication Sig Start Date End Date Taking? Authorizing Provider  albuterol (VENTOLIN HFA) 108 (90 Base) MCG/ACT inhaler Inhale 2  puffs into the lungs every 6 (six) hours as needed for wheezing or shortness of breath. 11/27/18  Yes Myles Lipps, MD  baclofen (LIORESAL) 10 MG tablet  06/26/19  Yes [provider]  cetirizine (ZYRTEC) 10 MG tablet Take 10 mg by mouth daily. 05/01/16  Yes [provider]  escitalopram (LEXAPRO) 20 MG tablet Take 1 tablet (20 mg total) by mouth at bedtime. 02/24/19  Yes Myles Lipps, MD  fluticasone [facility]) 50 MCG/ACT nasal spray Place 2 sprays into both nostrils daily as needed for allergies. 05/01/16  Yes [provider]  ZOLMitriptan (ZOMIG PO) Take by mouth.   Yes [provider]  zonisamide (ZONEGRAN) 25 MG capsule Take 100 mg by mouth daily. 06/01/19  Yes [provider]     Past Medical History:  Diagnosis Date  . Anxiety   . Asthma    controlled per patient last attack in January  . Environmental allergies   . Obesity     Past Surgical History:  Procedure Laterality Date  . APPENDECTOMY  04/07/2011  . CHOLECYSTECTOMY  10/18/2011   Procedure: LAPAROSCOPIC CHOLECYSTECTOMY WITH INTRAOPERATIVE CHOLANGIOGRAM;  Surgeon: Emelia Loron, MD;  Location: [facility] OR;  Service: General;  Laterality: N/A;  . IR ANGIO INTRA EXTRACRAN SEL COM CAROTID INNOMINATE UNI R MOD SED  06/02/2019  . IR ANGIO INTRA EXTRACRAN SEL INTERNAL CAROTID UNI L MOD SED  06/02/2019  . IR ANGIO VERTEBRAL SEL VERTEBRAL UNI R MOD SED  06/02/2019  . IR US GUIDE VASC ACCESS RIGHT  06/02/2019  . TONSILLECTOMY    . WISDOM TOOTH EXTRACTION      Social History   Tobacco Use  . Smoking status: Never Smoker  . Smokeless tobacco: Never Used  Substance Use Topics  . Alcohol use: Not Currently    Comment: Occasional    Family History  Problem Relation Age of Onset  . Cancer Mother        breast and spinal CA  . Asthma Mother   . Healthy Father   . Breast cancer Maternal Grandmother   . Diabetes Maternal Grandmother   . Rheum arthritis Maternal Grandmother   . Ovarian cancer Paternal Aunt   . Ovarian cancer Maternal Aunt   . Diabetes Sister   . Diabetes Paternal Grandfather   . Hypertension Maternal Grandfather   . Colon cancer Neg Hx     ROS Per hpi  OBJECTIVE:  Today's Vitals   10/16/19 1021  BP: 109/79  Pulse: 79  Temp: 98.2 F (36.8 C)  TempSrc: Temporal  SpO2: 96%  Weight: (!) 290 lb (131.5 kg)  Height: 5\' 4"  (1.626 m)   Body mass index is 49.78 kg/m.   Wt Readings from Last 3 Encounters:  10/16/19 (!) 290 lb (131.5 kg)  07/23/19 290 lb (131.5 kg)  06/02/19 278 lb (126.1 kg)     Physical Exam Vitals and nursing note reviewed.  Constitutional:      Appearance: She is well-developed.  HENT:     Head: Normocephalic and atraumatic.  Eyes:     General: No  scleral icterus.    Conjunctiva/sclera: Conjunctivae normal.     Pupils: Pupils are equal, round, and reactive to light.  Pulmonary:     Effort: Pulmonary effort is normal.  Musculoskeletal:     Cervical back: Neck supple.  Skin:    General: Skin is warm and dry.  Neurological:     Mental Status: She is alert and oriented to person, place, and time.  No results found for this or any previous visit (from the past 24 hour(s)).  No results found.   ASSESSMENT and PLAN  1. Insomnia, unspecified type Multifactorial. Given possible mood disorder, trial of Seroquel, reviewed r/se/b, discussed titration.   2. Mood disorder (HCC) Patient with sign depression and anxiety scores but also positive MDQ9, referring to psych. Patient reports fleeting SI, no plan or intent. ER precautions reviewed. - Ambulatory referral to Psychiatry  3. OSA (obstructive sleep apnea) On cpap. Managed by sleep.  4. Chronic migraine without aura, with intractable migraine, so stated, with status migrainosus Managed by headache specialist  Other orders - QUEtiapine (SEROQUEL) 100 MG tablet; Take 0.5-1 tablets (50-100 mg total) by mouth at bedtime.  Return in about 4 weeks (around 11/13/2019).    Myles Lipps, MD Primary Care at [facility] [address] [place], [place] [address] Ph.  [phone] Fax [phone]

## 2019-11-23 ENCOUNTER — Ambulatory Visit: Payer: Self-pay | Admitting: Neurology

## 2019-12-02 ENCOUNTER — Encounter: Payer: No Typology Code available for payment source | Admitting: Family Medicine

## 2019-12-03 ENCOUNTER — Other Ambulatory Visit: Payer: Self-pay

## 2019-12-03 ENCOUNTER — Encounter: Payer: Self-pay | Admitting: Family Medicine

## 2019-12-03 ENCOUNTER — Ambulatory Visit (INDEPENDENT_AMBULATORY_CARE_PROVIDER_SITE_OTHER): Payer: No Typology Code available for payment source | Admitting: Family Medicine

## 2019-12-03 VITALS — BP 114/76 | HR 76 | Temp 97.6°F | Ht 64.0 in | Wt 294.4 lb

## 2019-12-03 DIAGNOSIS — Z13220 Encounter for screening for lipoid disorders: Secondary | ICD-10-CM

## 2019-12-03 DIAGNOSIS — Z131 Encounter for screening for diabetes mellitus: Secondary | ICD-10-CM

## 2019-12-03 DIAGNOSIS — Z0000 Encounter for general adult medical examination without abnormal findings: Secondary | ICD-10-CM

## 2019-12-03 DIAGNOSIS — Z1329 Encounter for screening for other suspected endocrine disorder: Secondary | ICD-10-CM | POA: Diagnosis not present

## 2019-12-03 DIAGNOSIS — Z13228 Encounter for screening for other metabolic disorders: Secondary | ICD-10-CM

## 2019-12-03 DIAGNOSIS — Z130 Encounter for screening for diseases of the blood and blood-forming organs and certain disorders involving the immune mechanism: Secondary | ICD-10-CM | POA: Diagnosis not present

## 2019-12-03 MED ORDER — QUETIAPINE FUMARATE 100 MG PO TABS: 100.0000 mg | ORAL_TABLET | Freq: Every day | ORAL | 5 refills | Status: DC

## 2019-12-03 NOTE — Patient Instructions (Addendum)
[facility], [place], [place] [address]  ~2.8 mi [phone]    Preventive Care 74-31 Years Old, Female Preventive care refers to visits with your health care provider and lifestyle choices that can promote health and wellness. This includes:  A yearly physical exam. This may also be called an annual well check.  Regular dental visits and eye exams.  Immunizations.  Screening for certain conditions.  Healthy lifestyle choices, such as eating a healthy diet, getting regular exercise, not using drugs or products that contain nicotine and tobacco, and limiting alcohol use. What can I expect for my preventive care visit? Physical exam Your health care provider will check your:  Height and weight. This may be used to calculate body mass index (BMI), which tells if you are at a healthy weight.  Heart rate and blood pressure.  Skin for abnormal spots. Counseling Your health care provider may ask you questions about your:  Alcohol, tobacco, and drug use.  Emotional well-being.  Home and relationship well-being.  Sexual activity.  Eating habits.  Work and work Statistician.  Method of birth control.  Menstrual cycle.  Pregnancy history. What immunizations do I need?  Influenza (flu) vaccine  This is recommended every year. Tetanus, diphtheria, and pertussis (Tdap) vaccine  You may need a Td booster every 10 years. Varicella (chickenpox) vaccine  You may need this if you have not been vaccinated. Human papillomavirus (HPV) vaccine  If recommended by your health care provider, you may need three doses over 6 months. Measles, mumps, and rubella (MMR) vaccine  You may need at least one dose of MMR. You may also need a second dose. Meningococcal conjugate (MenACWY) vaccine  One dose is recommended if you are age 53-21 years and a first-year college student living in a residence hall, or if you have  one of several medical conditions. You may also need additional booster doses. Pneumococcal conjugate (PCV13) vaccine  You may need this if you have certain conditions and were not previously vaccinated. Pneumococcal polysaccharide (PPSV23) vaccine  You may need one or two doses if you smoke cigarettes or if you have certain conditions. Hepatitis A vaccine  You may need this if you have certain conditions or if you travel or work in places where you may be exposed to hepatitis A. Hepatitis B vaccine  You may need this if you have certain conditions or if you travel or work in places where you may be exposed to hepatitis B. Haemophilus influenzae type b (Hib) vaccine  You may need this if you have certain conditions. You may receive vaccines as individual doses or as more than one vaccine together in one shot (combination vaccines). Talk with your health care provider about the risks and benefits of combination vaccines. What tests do I need?  Blood tests  Lipid and cholesterol levels. These may be checked every 5 years starting at age 68.  Hepatitis C test.  Hepatitis B test. Screening  Diabetes screening. This is done by checking your blood sugar (glucose) after you have not eaten for a while (fasting).  Sexually transmitted disease (STD) testing.  BRCA-related cancer screening. This may be done if you have a family history of breast, ovarian, tubal, or peritoneal cancers.  Pelvic exam and Pap test. This may be done every 3 years starting at age 47. Starting at age 37, this may be done every 5 years if you have a Pap  test in combination with an HPV test. Talk with your health care provider about your test results, treatment options, and if necessary, the need for more tests. Follow these instructions at home: Eating and drinking   Eat a diet that includes fresh fruits and vegetables, whole grains, lean protein, and low-fat dairy.  Take vitamin and mineral supplements as  recommended by your health care provider.  Do not drink alcohol if: ? Your health care provider tells you not to drink. ? You are pregnant, may be pregnant, or are planning to become pregnant.  If you drink alcohol: ? Limit how much you have to 0-1 drink a day. ? Be aware of how much alcohol is in your drink. In the [place] one drink equals one 12 oz bottle of beer (355 mL), one 5 oz glass of wine (148 mL), or one 1 oz glass of hard liquor (44 mL). Lifestyle  Take daily care of your teeth and gums.  Stay active. Exercise for at least 30 minutes on 5 or more days each week.  Do not use any products that contain nicotine or tobacco, such as cigarettes, e-cigarettes, and chewing tobacco. If you need help quitting, ask your health care provider.  If you are sexually active, practice safe sex. Use a condom or other form of birth control (contraception) in order to prevent pregnancy and STIs (sexually transmitted infections). If you plan to become pregnant, see your health care provider for a preconception visit. What's next?  Visit your health care provider once a year for a well check visit.  Ask your health care provider how often you should have your eyes and teeth checked.  Stay up to date on all vaccines. This information is not intended to replace advice given to you by your health care provider. Make sure you discuss any questions you have with your health care provider. Document Revised: 11/21/2017 Document Reviewed: 11/21/2017 Elsevier Patient Education  [employer].    If you have lab work done today you will be contacted with your lab results within the next 2 weeks.  If you have not heard from [place] then please contact us. The fastest way to get your results is to register for My Chart.   IF you received an x-ray today, you will receive an invoice from [facility]. Please contact [facility] at [phone] with questions or concerns regarding your  invoice.   IF you received labwork today, you will receive an invoice from [facility]. Please contact [facility] at [phone] with questions or concerns regarding your invoice.   Our billing staff will not be able to assist you with questions regarding bills from these companies.  You will be contacted with the lab results as soon as they are available. The fastest way to get your results is to activate your My Chart account. Instructions are located on the last page of this paperwork. If you have not heard from [place] regarding the results in 2 weeks, please contact this office.

## 2019-12-03 NOTE — Progress Notes (Signed)
9/9/20218:22 AM  Rene Kocher 1990/06/24, 29 y.o., female [id number]  Chief Complaint  Patient presents with  . Annual Exam    HPI:   Patient is a 29 y.o. female with past medical history significant for OSA, mood disorder, insomonia, migraine, PCOS who presents today for CPE  Last CPE Sept 3 2020  Pap: September 29 2018, ob gyn STD: 2020 thru obgyn [facility] : trying to conceive, REI services currently not covered Menses: irregular Mammogram: none FHX breast/ovarian cancer: mother in her 42s and MGM breast cancer, paternal aunt had ovarian ca, patient had negative BRCA test FHx colon cancer: none Exercise/diet: aerobics, calisthenics, working on portions, working on eating healthier, has lost weight Uses albuterol prior to exercise  Most Recent Immunizations  Administered Date(s) Administered  . PFIZER SARS-COV-2 Vaccination 08/03/2019  . Td 11/27/2018  no flu vaccine as allergic to eggs  Wears eye glasses, has seen this year Needs to make dental appt   Hearing Screening   '125Hz'$  $Remo'250Hz'bvtIl$'500Hz'$'1000Hz'$'2000Hz'$'3000Hz'$'4000Hz'$'6000Hz'$'8000Hz'$   Right ear:           Left ear:             Visual Acuity Screening   Right eye Left eye Both eyes  Without correction:     With correction: $RemoveBeforeDE'20/30 20/25 20/25 'HRwxMFElhLybWGr$    Depression screen [facility] 2/9 10/16/2019 10/16/2019 04/23/2019  Decreased Interest 2 0 0  Down, Depressed, Hopeless 3 0 0  PHQ - 2 Score 5 0 0  Altered sleeping 3 - -  Tired, decreased energy 3 - -  Change in appetite 3 - -  Feeling bad or failure about yourself  3 - -  Trouble concentrating 2 - -  Moving slowly or fidgety/restless 1 - -  Suicidal thoughts 3 - -  PHQ-9 Score 23 - -  Difficult doing work/chores Not difficult at all - -  referred to [facility] - did not take her insurance, she is looking for outside psychiatry; however she will be getting [facility] in Oct, [facility] to [facility] then seroquel $RemoveBefor'100mg'ZaQMRyAYUYTU$  at bedtime is helping with insomnia, having irritability issues  Fall Risk   12/03/2019 10/16/2019 10/16/2019 04/23/2019 03/24/2019  Falls in the past year? 0 0 0 0 0  Number falls in past yr: 0 0 0 0 0  Injury with Fall? 0 0 0 0 0  Follow up - Falls evaluation completed Falls evaluation completed - Falls evaluation completed     Allergies  Allergen Reactions  . Dairy Aid [Lactase] Anaphylaxis  . Shellfish Allergy Anaphylaxis  . Benadryl [Diphenhydramine Hcl] Hives  . Eggs Or Egg-Derived Products Hives and Rash  . Iodine Hives and Rash  . Tape Rash    Prior to Admission medications   Medication Sig Start Date End Date Taking? Authorizing Provider  albuterol (VENTOLIN HFA) 108 (90 Base) MCG/ACT inhaler Inhale 2 puffs into the lungs every 6 (six) hours as needed for wheezing or shortness of breath. 11/27/18  Yes Rutherford Guys, MD  baclofen (LIORESAL) 10 MG tablet  06/26/19  Yes [provider]  cetirizine (ZYRTEC) 10 MG tablet Take 10 mg by mouth daily. 05/01/16  Yes [provider]  QUEtiapine (SEROQUEL) 100 MG tablet Take 0.5-1 tablets (50-100 mg total) by mouth at bedtime. 10/16/19  Yes Rutherford Guys, MD  ZOLMitriptan (ZOMIG PO) Take by mouth.   Yes [provider]  zonisamide (ZONEGRAN) 25 MG capsule Take 100 mg by mouth  daily. 06/01/19  Yes [provider]    Past Medical History:  Diagnosis Date  . Anxiety   . Asthma    controlled per patient last attack in January  . Environmental allergies   . Obesity     Past Surgical History:  Procedure Laterality Date  . APPENDECTOMY  04/07/2011  . CHOLECYSTECTOMY  10/18/2011   Procedure: LAPAROSCOPIC CHOLECYSTECTOMY WITH INTRAOPERATIVE CHOLANGIOGRAM;  Surgeon: Rolm Bookbinder, MD;  Location: [facility];  Service: General;  Laterality: N/A;  . IR ANGIO INTRA EXTRACRAN SEL COM CAROTID INNOMINATE UNI R MOD SED  06/02/2019  . IR ANGIO INTRA EXTRACRAN SEL INTERNAL CAROTID UNI L MOD SED  06/02/2019  . IR ANGIO VERTEBRAL SEL VERTEBRAL UNI R MOD SED  06/02/2019  . IR US GUIDE VASC ACCESS RIGHT   06/02/2019  . TONSILLECTOMY    . WISDOM TOOTH EXTRACTION      Social History   Tobacco Use  . Smoking status: Never Smoker  . Smokeless tobacco: Never Used  Substance Use Topics  . Alcohol use: Not Currently    Comment: Occasional    Family History  Problem Relation Age of Onset  . Cancer Mother        breast and spinal CA  . Asthma Mother   . Healthy Father   . Breast cancer Maternal Grandmother   . Diabetes Maternal Grandmother   . Rheum arthritis Maternal Grandmother   . Ovarian cancer Paternal Aunt   . Ovarian cancer Maternal Aunt   . Diabetes Sister   . Diabetes Paternal Grandfather   . Hypertension Maternal Grandfather   . Colon cancer Neg Hx     Review of Systems  Constitutional: Negative for chills, fever and malaise/fatigue.  HENT: Negative for hearing loss, sore throat and tinnitus.   Eyes: Negative for blurred vision and double vision.  Respiratory: Negative for cough and shortness of breath.   Cardiovascular: Negative for chest pain, palpitations and leg swelling.  Gastrointestinal: Negative for abdominal pain, nausea and vomiting.  Genitourinary: Negative for dysuria and hematuria.  Musculoskeletal: Negative for joint pain and myalgias.  Neurological: Positive for headaches (managed by Dr Domingo Cocking). Negative for dizziness.  Endo/Heme/Allergies: Negative for polydipsia.   Per hpi  OBJECTIVE:  Today's Vitals   12/03/19 0816  BP: 114/76  Pulse: 76  Temp: 97.6 F (36.4 C)  SpO2: 96%  Weight: 294 lb 6.4 oz (133.5 kg)  Height: $Remove'5\' 4"'LxaBRHe$  (1.626 m)   Body mass index is 50.53 kg/m.  Wt Readings from Last 3 Encounters:  12/03/19 294 lb 6.4 oz (133.5 kg)  10/16/19 (!) 290 lb (131.5 kg)  07/23/19 290 lb (131.5 kg)    Physical Exam Vitals and nursing note reviewed.  Constitutional:      Appearance: She is well-developed.  HENT:     Head: Normocephalic and atraumatic.     Right Ear: Hearing, tympanic membrane, ear canal and external ear normal.      Left Ear: Hearing, tympanic membrane, ear canal and external ear normal.     Mouth/Throat:     Mouth: Mucous membranes are moist.     Pharynx: No oropharyngeal exudate or posterior oropharyngeal erythema.  Eyes:     Extraocular Movements: Extraocular movements intact.     Conjunctiva/sclera: Conjunctivae normal.     Pupils: Pupils are equal, round, and reactive to light.  Neck:     Thyroid: No thyromegaly.  Cardiovascular:     Rate and Rhythm: Normal rate and regular rhythm.  Heart sounds: Normal heart sounds. No murmur heard.  No friction rub. No gallop.   Pulmonary:     Effort: Pulmonary effort is normal.     Breath sounds: Normal breath sounds. No wheezing, rhonchi or rales.  Abdominal:     General: Bowel sounds are normal. There is no distension.     Palpations: Abdomen is soft. There is no hepatomegaly, splenomegaly or mass.     Tenderness: There is no abdominal tenderness.  Musculoskeletal:        General: Normal range of motion.     Cervical back: Neck supple.     Right lower leg: No edema.     Left lower leg: No edema.  Lymphadenopathy:     Cervical: No cervical adenopathy.  Skin:    General: Skin is warm and dry.  Neurological:     Mental Status: She is alert and oriented to person, place, and time.     Cranial Nerves: No cranial nerve deficit.     Gait: Gait normal.     Deep Tendon Reflexes: Reflexes are normal and symmetric.  Psychiatric:        Mood and Affect: Mood normal.        Behavior: Behavior normal.     No results found for this or any previous visit (from the past 24 hour(s)).  No results found.   ASSESSMENT and PLAN  1. Annual physical exam Routine HCM labs ordered. HCM reviewed/discussed. Anticipatory guidance regarding healthy weight, lifestyle and choices given.   2. Screening for lipid disorders - Lipid panel  3. Screening for thyroid disorder - TSH  4. Screening for deficiency anemia - CBC  5. Screening for diabetes mellitus -  Hemoglobin A1c  6. Screening for metabolic disorder - Comprehensive metabolic panel  Other orders - QUEtiapine (SEROQUEL) 100 MG tablet; Take 1 tablet (100 mg total) by mouth at bedtime.  Return in about 1 year (around 12/02/2020) for CPE.    Rutherford Guys, MD Primary Care at [facility], [place] [address] Ph.  [phone] Fax [phone]

## 2019-12-04 LAB — LIPID PANEL
Chol/HDL Ratio: 2.6 ratio (ref 0.0–4.4)
Cholesterol, Total: 196 mg/dL (ref 100–199)
HDL: 76 mg/dL (ref >39)
LDL Chol Calc (NIH): 112 mg/dL — ABNORMAL HIGH (ref 0–99)
Triglycerides: 44 mg/dL (ref 0–149)
VLDL Cholesterol Cal: 8 mg/dL (ref 5–40)

## 2019-12-04 LAB — CBC
Hematocrit: 34.3 % (ref 34.0–46.6)
Hemoglobin: 11.3 g/dL (ref 11.1–15.9)
MCH: 29.4 pg (ref 26.6–33.0)
MCHC: 32.9 g/dL (ref 31.5–35.7)
MCV: 89 fL (ref 79–97)
Platelets: 373 10*3/uL (ref 150–450)
RBC: 3.85 x10E6/uL (ref 3.77–5.28)
RDW: 13.9 % (ref 11.7–15.4)
WBC: 5.1 10*3/uL (ref 3.4–10.8)

## 2019-12-04 LAB — COMPREHENSIVE METABOLIC PANEL
ALT: 13 IU/L (ref 0–32)
AST: 15 IU/L (ref 0–40)
Albumin/Globulin Ratio: 1.4 (ref 1.2–2.2)
Albumin: 4 g/dL (ref 3.8–4.8)
Alkaline Phosphatase: 52 IU/L (ref 48–121)
BUN/Creatinine Ratio: 10 (ref 9–23)
BUN: 9 mg/dL (ref 6–20)
Bilirubin Total: <0.2 mg/dL (ref 0.0–1.2)
CO2: 23 mmol/L (ref 20–29)
Calcium: 10 mg/dL (ref 8.7–10.2)
Chloride: 101 mmol/L (ref 96–106)
Creatinine, Ser: 0.87 mg/dL (ref 0.57–1.00)
GFR calc Af Amer: 103 mL/min/{1.73_m2} (ref >59)
GFR calc non Af Amer: 89 mL/min/{1.73_m2} (ref >59)
Globulin, Total: 2.9 g/dL (ref 1.5–4.5)
Glucose: 82 mg/dL (ref 65–99)
Potassium: 4.3 mmol/L (ref 3.5–5.2)
Sodium: 139 mmol/L (ref 134–144)
Total Protein: 6.9 g/dL (ref 6.0–8.5)

## 2019-12-04 LAB — TSH: TSH: 1.49 u[IU]/mL (ref 0.450–4.500)

## 2019-12-04 LAB — HEMOGLOBIN A1C
Est. average glucose Bld gHb Est-mCnc: 105 mg/dL
Hgb A1c MFr Bld: 5.3 % (ref 4.8–5.6)

## 2020-01-04 ENCOUNTER — Ambulatory Visit: Payer: Self-pay | Admitting: Neurology

## 2020-01-24 ENCOUNTER — Other Ambulatory Visit: Payer: Self-pay

## 2020-01-24 ENCOUNTER — Encounter (HOSPITAL_COMMUNITY): Payer: Self-pay

## 2020-01-24 ENCOUNTER — Ambulatory Visit (HOSPITAL_COMMUNITY)
Admission: EM | Admit: 2020-01-24 | Discharge: 2020-01-24 | Disposition: A | Discharge status: Home / Self Care | Payer: No Typology Code available for payment source | Attending: Family Medicine | Admitting: Family Medicine

## 2020-01-24 DIAGNOSIS — G44209 Tension-type headache, unspecified, not intractable: Secondary | ICD-10-CM

## 2020-01-24 DIAGNOSIS — A630 Anogenital (venereal) warts: Secondary | ICD-10-CM

## 2020-01-24 MED ORDER — METHYLPREDNISOLONE ACETATE 40 MG/ML IJ SUSP
INTRAMUSCULAR | Status: AC
  Filled 2020-01-24: qty 1

## 2020-01-24 MED ORDER — KETOROLAC TROMETHAMINE 30 MG/ML IJ SOLN
INTRAMUSCULAR | Status: AC
  Filled 2020-01-24: qty 1

## 2020-01-24 MED ORDER — METHYLPREDNISOLONE ACETATE 40 MG/ML IJ SUSP
40.0000 mg | Freq: Once | INTRAMUSCULAR | Status: AC
  Administered 2020-01-24: 40 mg via INTRAMUSCULAR

## 2020-01-24 MED ORDER — KETOROLAC TROMETHAMINE 30 MG/ML IJ SOLN
30.0000 mg | Freq: Once | INTRAMUSCULAR | Status: AC
  Administered 2020-01-24: 30 mg via INTRAMUSCULAR

## 2020-01-24 NOTE — ED Provider Notes (Signed)
[facility]    CSN: [id number] Arrival date & time: 01/24/20  1036      History   Chief Complaint Chief Complaint  Patient presents with  . Migraine  . Recurrent Skin Infections    HPI Skylynn E Scantling is a 29 y.o. female.   She is presenting with a headache that originates from her neck and radiates towards her head.  She is also presenting with a skin tag on the inner left thigh.  Headaches been ongoing for a few weeks.  Is occurring every day.  She is not on her current medications.  She is not taking amitriptyline or Seroquel.  She is not taking Tylenol or ibuprofen.  She denies any changes in her eyesight.  HPI  Past Medical History:  Diagnosis Date  . Anxiety   . Asthma    controlled per patient last attack in January  . Environmental allergies   . Obesity     Patient Active Problem List   Diagnosis Date Noted  . Chronic migraine without aura, with intractable migraine, so stated, with status migrainosus 10/16/2019  . OSA (obstructive sleep apnea) 10/16/2019  . Insomnia 10/16/2019  . Asthma 09/29/2018  . Polycystic ovary syndrome 09/29/2018  . Obesity (BMI 30-39.9) 04/11/2011    Past Surgical History:  Procedure Laterality Date  . APPENDECTOMY  04/07/2011  . CHOLECYSTECTOMY  10/18/2011   Procedure: LAPAROSCOPIC CHOLECYSTECTOMY WITH INTRAOPERATIVE CHOLANGIOGRAM;  Surgeon: Emelia Loron, MD;  Location: [facility];  Service: General;  Laterality: N/A;  . IR ANGIO INTRA EXTRACRAN SEL COM CAROTID INNOMINATE UNI R MOD SED  06/02/2019  . IR ANGIO INTRA EXTRACRAN SEL INTERNAL CAROTID UNI L MOD SED  06/02/2019  . IR ANGIO VERTEBRAL SEL VERTEBRAL UNI R MOD SED  06/02/2019  . IR US GUIDE VASC ACCESS RIGHT  06/02/2019  . TONSILLECTOMY    . WISDOM TOOTH EXTRACTION      OB History   No obstetric history on file.      Home Medications    Prior to Admission medications   Medication Sig Start Date End Date Taking? Authorizing Provider  albuterol (VENTOLIN HFA) 108  (90 Base) MCG/ACT inhaler Inhale 2 puffs into the lungs every 6 (six) hours as needed for wheezing or shortness of breath. 11/27/18   Myles Lipps, MD  baclofen (LIORESAL) 10 MG tablet  06/26/19   [provider]  cetirizine (ZYRTEC) 10 MG tablet Take 10 mg by mouth daily. 05/01/16   [provider]  QUEtiapine (SEROQUEL) 100 MG tablet Take 1 tablet (100 mg total) by mouth at bedtime. 12/03/19   Myles Lipps, MD  ZOLMitriptan (ZOMIG PO) Take by mouth.    [provider]  zonisamide (ZONEGRAN) 25 MG capsule Take 100 mg by mouth daily. 06/01/19   [provider]    Family History Family History  Problem Relation Age of Onset  . Cancer Mother        breast and spinal CA  . Asthma Mother   . Healthy Father   . Breast cancer Maternal Grandmother   . Diabetes Maternal Grandmother   . Rheum arthritis Maternal Grandmother   . Ovarian cancer Paternal Aunt   . Ovarian cancer Maternal Aunt   . Diabetes Sister   . Diabetes Paternal Grandfather   . Hypertension Maternal Grandfather   . Colon cancer Neg Hx     Social History Social History   Tobacco Use  . Smoking status: Never Smoker  . Smokeless tobacco:  Never Used  Vaping Use  . Vaping Use: Never used  Substance Use Topics  . Alcohol use: Not Currently    Comment: Occasional  . Drug use: No     Allergies   Dairy aid [lactase], Shellfish allergy, Benadryl [diphenhydramine hcl], Eggs or egg-derived products, Iodine, and Tape   Review of Systems Review of Systems  See HPI  Physical Exam Triage Vital Signs ED Triage Vitals  Enc Vitals Group     BP 01/24/20 1152 125/77     Pulse Rate 01/24/20 1152 70     Resp 01/24/20 1152 16     Temp 01/24/20 1152 98.6 F (37 C)     Temp Source 01/24/20 1152 Oral     SpO2 01/24/20 1152 100 %     Weight --      Height --      Head Circumference --      Peak Flow --      Pain Score 01/24/20 1151 9     Pain Loc --      Pain Edu? --      Excl. in GC?  --    No data found.  Updated Vital Signs BP 125/77 (BP Location: Right Arm)   Pulse 70   Temp 98.6 F (37 C) (Oral)   Resp 16   SpO2 100%   Visual Acuity Right Eye Distance:   Left Eye Distance:   Bilateral Distance:    Right Eye Near:   Left Eye Near:    Bilateral Near:     Physical Exam Gen: NAD, alert, cooperative with exam, well-appearing ENT: normal lips, normal nasal mucosa,  Eye: normal EOM, normal conjunctiva and lids Skin: Dome-shaped skin colored wart on the inner left thigh Neuro: normal tone, normal sensation to touch Psych:  normal insight, alert and oriented   UC Treatments / Results  Labs (all labs ordered are listed, but only abnormal results are displayed) Labs Reviewed - No data to display  EKG   Radiology No results found.  Procedures Procedures (including critical care time)  Medications Ordered in UC Medications  ketorolac (TORADOL) 30 MG/ML injection 30 mg (30 mg Intramuscular Given 01/24/20 1226)  methylPREDNISolone acetate (DEPO-MEDROL) injection 40 mg (40 mg Intramuscular Given 01/24/20 1226)    Initial Impression / Assessment and Plan / UC Course  I have reviewed the triage vital signs and the nursing notes.  Pertinent labs & imaging results that were available during my care of the patient were reviewed by me and considered in my medical decision making (see chart for details).     Ms. Gavel is a 29 year old female with symptoms suggestive of a tension headache as well as condylomata acuminata.  Provided intramuscular Toradol and Depo-Medrol.  Counseled on supportive care.  Given indications on follow-up.  Final Clinical Impressions(s) / UC Diagnoses   Final diagnoses:  Tension headache  Condylomata acuminata     Discharge Instructions     Please try heat on the neck  Please try relaxation techniques  Please follow up if your symptoms fail to improve.     ED Prescriptions    None     PDMP not reviewed this  encounter.   Myra Rude, MD 01/24/20 [phone]

## 2020-01-24 NOTE — Discharge Instructions (Addendum)
Please try heat on the neck  Please try relaxation techniques  Please follow up if your symptoms fail to improve.

## 2020-01-24 NOTE — ED Triage Notes (Signed)
Pt present migraine with a bump located on the inside of her left thigh. Pt states that she suffer from chronic migraines and normal received injections but she has to find another pain clinic and the bump is irritated.

## 2020-01-27 ENCOUNTER — Ambulatory Visit: Payer: Self-pay | Admitting: Neurology

## 2020-01-27 ENCOUNTER — Other Ambulatory Visit: Payer: Self-pay

## 2020-01-27 ENCOUNTER — Encounter: Payer: Self-pay | Admitting: Family Medicine

## 2020-01-27 ENCOUNTER — Ambulatory Visit (INDEPENDENT_AMBULATORY_CARE_PROVIDER_SITE_OTHER): Payer: No Typology Code available for payment source | Admitting: Family Medicine

## 2020-01-27 ENCOUNTER — Other Ambulatory Visit (HOSPITAL_COMMUNITY): Payer: Self-pay | Admitting: Family Medicine

## 2020-01-27 VITALS — BP 119/79 | HR 84 | Temp 98.6°F | Ht 64.0 in | Wt 293.0 lb

## 2020-01-27 DIAGNOSIS — F39 Unspecified mood [affective] disorder: Secondary | ICD-10-CM

## 2020-01-27 DIAGNOSIS — G43711 Chronic migraine without aura, intractable, with status migrainosus: Secondary | ICD-10-CM

## 2020-01-27 DIAGNOSIS — G4700 Insomnia, unspecified: Secondary | ICD-10-CM

## 2020-01-27 DIAGNOSIS — G43909 Migraine, unspecified, not intractable, without status migrainosus: Secondary | ICD-10-CM | POA: Insufficient documentation

## 2020-01-27 DIAGNOSIS — R21 Rash and other nonspecific skin eruption: Secondary | ICD-10-CM

## 2020-01-27 DIAGNOSIS — J45909 Unspecified asthma, uncomplicated: Secondary | ICD-10-CM

## 2020-01-27 MED ORDER — ESCITALOPRAM OXALATE 10 MG PO TABS: 10.0000 mg | ORAL_TABLET | Freq: Every day | ORAL | 3 refills | Status: DC

## 2020-01-27 MED ORDER — QUETIAPINE FUMARATE 100 MG PO TABS: 100.0000 mg | ORAL_TABLET | Freq: Every day | ORAL | 5 refills | Status: DC

## 2020-01-27 MED ORDER — RIZATRIPTAN BENZOATE 5 MG PO TABS: 5.0000 mg | ORAL_TABLET | ORAL | 3 refills | Status: DC | PRN

## 2020-01-27 MED ORDER — ALBUTEROL SULFATE HFA 108 (90 BASE) MCG/ACT IN AERS: 2.0000 | INHALATION_SPRAY | Freq: Four times a day (QID) | RESPIRATORY_TRACT | 2 refills | Status: DC | PRN

## 2020-01-27 MED FILL — ALBUTEROL SULFATE HFA 108 (: 108 (90 BAS | 25 days supply | Qty: 18 | Fill #0

## 2020-01-27 MED FILL — QUETIAPINE FUMARATE 100 MG: 100 | 30 days supply | Qty: 30 | Fill #0

## 2020-01-27 MED FILL — RIZATRIPTAN 5 MG TABLET: 5 | 90 days supply | Qty: 40 | Fill #0

## 2020-01-27 MED FILL — ESCITALOPRAM 10 MG TABLET: 10 | 90 days supply | Qty: 90 | Fill #0

## 2020-01-27 NOTE — Patient Instructions (Addendum)
Migraine Headache A migraine headache is a very strong throbbing pain on one side or both sides of your head. This type of headache can also cause other symptoms. It can last from 4 hours to 3 days. Talk with your doctor about what things may bring on (trigger) this condition. What are the causes? The exact cause of this condition is not known. This condition may be triggered or caused by:  Drinking alcohol.  Smoking.  Taking medicines, such as: ? Medicine used to treat chest pain (nitroglycerin). ? Birth control pills. ? Estrogen. ? Some blood pressure medicines.  Eating or drinking certain products.  Doing physical activity. Other things that may trigger a migraine headache include:  Having a menstrual period.  Pregnancy.  Hunger.  Stress.  Not getting enough sleep or getting too much sleep.  Weather changes.  Tiredness (fatigue). What increases the risk?  Being 76-50 years old.  Being female.  Having a family history of migraine headaches.  Being Caucasian.  Having depression or anxiety.  Being very overweight. What are the signs or symptoms?  A throbbing pain. This pain may: ? Happen in any area of the head, such as on one side or both sides. ? Make it hard to do daily activities. ? Get worse with physical activity. ? Get worse around bright lights or loud noises.  Other symptoms may include: ? Feeling sick to your stomach (nauseous). ? Vomiting. ? Dizziness. ? Being sensitive to bright lights, loud noises, or smells.  Before you get a migraine headache, you may get warning signs (an aura). An aura may include: ? Seeing flashing lights or having blind spots. ? Seeing bright spots, halos, or zigzag lines. ? Having tunnel vision or blurred vision. ? Having numbness or a tingling feeling. ? Having trouble talking. ? Having weak muscles.  Some people have symptoms after a migraine headache (postdromal phase), such as: ? Tiredness. ? Trouble  thinking (concentrating). How is this treated?  Taking medicines that: ? Relieve pain. ? Relieve the feeling of being sick to your stomach. ? Prevent migraine headaches.  Treatment may also include: ? Having acupuncture. ? Avoiding foods that bring on migraine headaches. ? Learning ways to control your body functions (biofeedback). ? Therapy to help you know and deal with negative thoughts (cognitive behavioral therapy). Follow these instructions at home: Medicines  Take over-the-counter and prescription medicines only as told by your doctor.  Ask your doctor if the medicine prescribed to you: ? Requires you to avoid driving or using heavy machinery. ? Can cause trouble pooping (constipation). You may need to take these steps to prevent or treat trouble pooping:  Drink enough fluid to keep your pee (urine) pale yellow.  Take over-the-counter or prescription medicines.  Eat foods that are high in fiber. These include beans, whole grains, and fresh fruits and vegetables.  Limit foods that are high in fat and sugar. These include fried or sweet foods. Lifestyle  Do not drink alcohol.  Do not use any products that contain nicotine or tobacco, such as cigarettes, e-cigarettes, and chewing tobacco. If you need help quitting, ask your doctor.  Get at least 8 hours of sleep every night.  Limit and deal with stress. General instructions      Keep a journal to find out what may bring on your migraine headaches. For example, write down: ? What you eat and drink. ? How much sleep you get. ? Any change in what you eat or drink. ? Any  change in your medicines.  If you have a migraine headache: ? Avoid things that make your symptoms worse, such as bright lights. ? It may help to lie down in a dark, quiet room. ? Do not drive or use heavy machinery. ? Ask your doctor what activities are safe for you.  Keep all follow-up visits as told by your doctor. This is important. Contact  a doctor if:  You get a migraine headache that is different or worse than others you have had.  You have more than 15 headache days in one month. Get help right away if:  Your migraine headache gets very bad.  Your migraine headache lasts longer than 72 hours.  You have a fever.  You have a stiff neck.  You have trouble seeing.  Your muscles feel weak or like you cannot control them.  You start to lose your balance a lot.  You start to have trouble walking.  You pass out (faint).  You have a seizure. Summary  A migraine headache is a very strong throbbing pain on one side or both sides of your head. These headaches can also cause other symptoms.  This condition may be treated with medicines and changes to your lifestyle.  Keep a journal to find out what may bring on your migraine headaches.  Contact a doctor if you get a migraine headache that is different or worse than others you have had.  Contact your doctor if you have more than 15 headache days in a month. This information is not intended to replace advice given to you by your health care provider. Make sure you discuss any questions you have with your health care provider. Document Revised: 07/04/2018 Document Reviewed: 04/24/2018 Elsevier Patient Education  [employer].     If you have lab work done today you will be contacted with your lab results within the next 2 weeks.  If you have not heard from [place] then please contact us. The fastest way to get your results is to register for My Chart.   IF you received an x-ray today, you will receive an invoice from [facility]. Please contact [facility] at [phone] with questions or concerns regarding your invoice.   IF you received labwork today, you will receive an invoice from [facility]. Please contact [facility] at [phone] with questions or concerns regarding your invoice.   Our billing staff will not be able to assist you with  questions regarding bills from these companies.  You will be contacted with the lab results as soon as they are available. The fastest way to get your results is to activate your My Chart account. Instructions are located on the last page of this paperwork. If you have not heard from [place] regarding the results in 2 weeks, please contact this office.

## 2020-01-27 NOTE — Progress Notes (Signed)
11/3/20212:09 PM  Felicia Salinas 29-May-1990, 29 y.o., female [id number]  Chief Complaint  Patient presents with  . chronic headaches    current meds not working/ referral for trigger injections for headaches- in [place]  . bump on inner thigh    has been there for 8 months or more / no drainage     HPI:   Patient is a 29 y.o. female with past medical history significant for OSA, mood disorder, insomonia, migraine, PCOS who presents today for migraines and bump on inner thigh.  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  Migraines 1 year if not longer Occurs everyday Currently taking Zonisamide, decreases the severity Has previously done trigger point injections Her prior doctor is not covered by her insurance Requesting referral  Bump on left inner thigh Now irritated 2 cm in diameter Unsure what it is Has been recently seen at urgent care for this   Pap: September 29 2018, ob gyn STD: 2020 thru obgyn BC : trying to conceive, REI services currently not covered Menses: irregular:  Mammogram: none FHX breast/ovarian cancer: mother in her 48s and MGM breast cancer, paternal aunt had ovarian ca, patient had negative BRCA test FHx colon cancer: none Exercise/diet: aerobics, calisthenics, working on portions, working on eating healthier, has lost weight Uses albuterol prior to exercise  Most Recent Immunizations  Administered Date(s) Administered  . PFIZER SARS-COV-2 Vaccination 08/03/2019  . Td 11/27/2018  no flu vaccine as allergic to eggs  Wears eye glasses, has seen this year Needs to make dental appt  No exam data present  Depression screen [facility] 2/9 01/27/2020 10/16/2019 10/16/2019  Decreased Interest 0 2 0  Down, Depressed, Hopeless 0 3 0  PHQ - 2 Score 0 5 0  Altered sleeping - 3 -  Tired, decreased energy - 3 -  Change in appetite - 3 -  Feeling bad or failure about yourself  - 3 -  Trouble concentrating - 2 -  Moving slowly or  fidgety/restless - 1 -  Suicidal thoughts - 3 -  PHQ-9 Score - 23 -  Difficult doing work/chores - Not difficult at all -  referred to [facility] - did not take her insurance, she is looking for outside psychiatry; however she will be getting [facility] Oct, she will go to [facility] then seroquel $RemoveBefor'100mg'CyXbGauqNhuJ$  at bedtime is helping with insomnia, having irritability issues  Fall Risk  01/27/2020 12/03/2019 10/16/2019 10/16/2019 04/23/2019  Falls in the past year? 0 0 0 0 0  Number falls in past yr: 0 0 0 0 0  Injury with Fall? 0 0 0 0 0  Follow up Falls evaluation completed - Falls evaluation completed Falls evaluation completed -     Allergies  Allergen Reactions  . Dairy Aid [Lactase] Anaphylaxis  . Shellfish Allergy Anaphylaxis  . Benadryl [Diphenhydramine Hcl] Hives  . Eggs Or Egg-Derived Products Hives and Rash  . Iodine Hives and Rash  . Tape Rash    Prior to Admission medications   Medication Sig Start Date End Date Taking? Authorizing Provider  albuterol (VENTOLIN HFA) 108 (90 Base) MCG/ACT inhaler Inhale 2 puffs into the lungs every 6 (six) hours as needed for wheezing or shortness of breath. 11/27/18  Yes Rutherford Guys, MD  baclofen (LIORESAL) 10 MG tablet  06/26/19  Yes [provider]  cetirizine (ZYRTEC) 10 MG tablet Take 10 mg by mouth daily. 05/01/16  Yes [provider]  QUEtiapine (SEROQUEL) 100 MG tablet  Take 0.5-1 tablets (50-100 mg total) by mouth at bedtime. 10/16/19  Yes Rutherford Guys, MD  ZOLMitriptan (ZOMIG PO) Take by mouth.   Yes [provider]  zonisamide (ZONEGRAN) 25 MG capsule Take 100 mg by mouth daily. 06/01/19  Yes [provider]    Past Medical History:  Diagnosis Date  . Anxiety   . Asthma    controlled per patient last attack in January  . Environmental allergies   . Obesity     Past Surgical History:  Procedure Laterality Date  . APPENDECTOMY  04/07/2011  . CHOLECYSTECTOMY  10/18/2011   Procedure: LAPAROSCOPIC  CHOLECYSTECTOMY WITH INTRAOPERATIVE CHOLANGIOGRAM;  Surgeon: Rolm Bookbinder, MD;  Location: [place];  Service: General;  Laterality: N/A;  . IR ANGIO INTRA EXTRACRAN SEL COM CAROTID INNOMINATE UNI R MOD SED  06/02/2019  . IR ANGIO INTRA EXTRACRAN SEL INTERNAL CAROTID UNI L MOD SED  06/02/2019  . IR ANGIO VERTEBRAL SEL VERTEBRAL UNI R MOD SED  06/02/2019  . IR US GUIDE VASC ACCESS RIGHT  06/02/2019  . TONSILLECTOMY    . WISDOM TOOTH EXTRACTION      Social History   Tobacco Use  . Smoking status: Never Smoker  . Smokeless tobacco: Never Used  Substance Use Topics  . Alcohol use: Not Currently    Comment: Occasional    Family History  Problem Relation Age of Onset  . Cancer Mother        breast and spinal CA  . Asthma Mother   . Healthy Father   . Breast cancer Maternal Grandmother   . Diabetes Maternal Grandmother   . Rheum arthritis Maternal Grandmother   . Ovarian cancer Paternal Aunt   . Ovarian cancer Maternal Aunt   . Diabetes Sister   . Diabetes Paternal Grandfather   . Hypertension Maternal Grandfather   . Colon cancer Neg Hx     Review of Systems  Constitutional: Negative for chills, fever and malaise/fatigue.  HENT: Negative for hearing loss, sore throat and tinnitus.   Eyes: Negative for blurred vision and double vision.  Respiratory: Negative for cough and shortness of breath.   Cardiovascular: Negative for chest pain, palpitations and leg swelling.  Gastrointestinal: Negative for abdominal pain, nausea and vomiting.  Genitourinary: Negative for dysuria and hematuria.  Musculoskeletal: Negative for joint pain and myalgias.  Skin:       Lesion to left inner thigh  Neurological: Positive for headaches (Referral placed to Neurology). Negative for dizziness.  Endo/Heme/Allergies: Negative for polydipsia.   Per hpi  OBJECTIVE:  Today's Vitals   01/27/20 1333  BP: 119/79  Pulse: 84  Temp: 98.6 F (37 C)  SpO2: 99%  Weight: 293 lb (132.9 kg)  Height: $Remove'5\' 4"'GoMyOjM$   (1.626 m)   Body mass index is 50.29 kg/m.  Wt Readings from Last 3 Encounters:  01/27/20 293 lb (132.9 kg)  12/03/19 294 lb 6.4 oz (133.5 kg)  10/16/19 (!) 290 lb (131.5 kg)    Physical Exam Vitals and nursing note reviewed.  Constitutional:      Appearance: She is well-developed.  HENT:     Head: Normocephalic and atraumatic.     Right Ear: Hearing, tympanic membrane, ear canal and external ear normal.     Left Ear: Hearing, tympanic membrane, ear canal and external ear normal.     Mouth/Throat:     Mouth: Mucous membranes are moist.     Pharynx: No oropharyngeal exudate or posterior oropharyngeal erythema.  Eyes:  Extraocular Movements: Extraocular movements intact.     Conjunctiva/sclera: Conjunctivae normal.     Pupils: Pupils are equal, round, and reactive to light.  Neck:     Thyroid: No thyromegaly.  Cardiovascular:     Rate and Rhythm: Normal rate and regular rhythm.     Heart sounds: Normal heart sounds. No murmur heard.  No friction rub. No gallop.   Pulmonary:     Effort: Pulmonary effort is normal.     Breath sounds: Normal breath sounds. No wheezing, rhonchi or rales.  Abdominal:     General: Bowel sounds are normal. There is no distension.     Palpations: Abdomen is soft. There is no hepatomegaly, splenomegaly or mass.     Tenderness: There is no abdominal tenderness.  Musculoskeletal:        General: Normal range of motion.     Cervical back: Neck supple.     Right lower leg: No edema.     Left lower leg: No edema.  Lymphadenopathy:     Cervical: No cervical adenopathy.  Skin:    General: Skin is warm and dry.     Findings: Lesion (left inner thigh) present. No erythema or rash.  Neurological:     General: No focal deficit present.     Mental Status: She is alert and oriented to person, place, and time.     Cranial Nerves: No cranial nerve deficit.     Sensory: No sensory deficit.     Motor: No weakness.     Coordination: Coordination normal.       Gait: Gait normal.     Deep Tendon Reflexes: Reflexes are normal and symmetric.  Psychiatric:        Mood and Affect: Mood normal.        Behavior: Behavior normal.     No results found for this or any previous visit (from the past 24 hour(s)).  No results found.   ASSESSMENT and PLAN  Problem List Items Addressed This Visit      Cardiovascular and Mediastinum   Chronic migraine without aura, with intractable migraine, so stated, with status migrainosus - Primary   Relevant Medications   zonisamide (ZONEGRAN) 100 MG capsule   escitalopram (LEXAPRO) 10 MG tablet   rizatriptan (MAXALT) 5 MG tablet take as needed for headaches and repeat dose after 2 hours as needed   Other Relevant Orders   Ambulatory referral to Neurology Her previous neurologist who gave her trigger point injections is no longer in her insurance network.     Respiratory   Asthma   Relevant Medications   albuterol (VENTOLIN HFA) 108 (90 Base) MCG/ACT inhaler Stable on current regimen.     Other   Insomnia   Relevant Medications   QUEtiapine (SEROQUEL) 100 MG tablet Stable on current regimen    Other Visit Diagnoses    Rash and nonspecific skin eruption       Relevant Orders   Ambulatory referral to Dermatology   Mood disorder ([place])       Relevant Medications   escitalopram (LEXAPRO) 10 MG tablet Stable on current regimen.      Return if symptoms worsen or fail to improve or at annual visit.    Thank you for your visit with Primary Care at [facility] today.  Huston Foley Maly Lemarr, FNP-BC [facility] at [address] [place], [place] [address] [phone] - 0000

## 2020-01-29 ENCOUNTER — Encounter: Payer: Self-pay | Admitting: Family Medicine

## 2020-01-29 ENCOUNTER — Encounter: Payer: Self-pay | Admitting: Neurology

## 2020-01-29 NOTE — Telephone Encounter (Signed)
Request for Endo referral

## 2020-01-31 NOTE — Telephone Encounter (Signed)
Hello, I am not sure what she wants an endocrinologist referral for, If you could get more details from her that would be great. Thanks!

## 2020-02-05 ENCOUNTER — Other Ambulatory Visit: Payer: Self-pay | Admitting: Family Medicine

## 2020-02-05 DIAGNOSIS — E282 Polycystic ovarian syndrome: Secondary | ICD-10-CM

## 2020-02-11 ENCOUNTER — Encounter: Payer: Self-pay | Admitting: Family Medicine

## 2020-02-11 ENCOUNTER — Other Ambulatory Visit (HOSPITAL_COMMUNITY): Payer: Self-pay | Admitting: Family Medicine

## 2020-02-11 ENCOUNTER — Other Ambulatory Visit: Payer: Self-pay | Admitting: Family Medicine

## 2020-02-11 DIAGNOSIS — G43711 Chronic migraine without aura, intractable, with status migrainosus: Secondary | ICD-10-CM

## 2020-02-11 MED ORDER — RIZATRIPTAN BENZOATE 10 MG PO TABS: 5.0000 mg | ORAL_TABLET | ORAL | 3 refills | Status: DC | PRN

## 2020-02-11 MED ORDER — RIZATRIPTAN BENZOATE 10 MG PO TABS: 10.0000 mg | ORAL_TABLET | ORAL | 3 refills | Status: DC | PRN

## 2020-02-11 NOTE — Telephone Encounter (Signed)
Last OV 01/27/2020  Pt asking if she may have increased dose of Maxalt as it is not working at 5 mg please advise if pt needs new appt to change this or if we can do this without a formal visit

## 2020-02-11 NOTE — Telephone Encounter (Signed)
Pharmacy has questions about dosing on Rx- sent for review

## 2020-02-12 MED FILL — RIZATRIPTAN BENZOATE 10 MG: 10 | 90 days supply | Qty: 40 | Fill #0

## 2020-03-08 ENCOUNTER — Ambulatory Visit: Payer: No Typology Code available for payment source | Admitting: Neurology

## 2020-03-08 ENCOUNTER — Telehealth: Payer: Self-pay

## 2020-03-08 ENCOUNTER — Other Ambulatory Visit: Payer: Self-pay

## 2020-03-08 ENCOUNTER — Encounter: Payer: Self-pay | Admitting: Neurology

## 2020-03-08 ENCOUNTER — Other Ambulatory Visit (HOSPITAL_COMMUNITY): Payer: Self-pay | Admitting: Neurology

## 2020-03-08 VITALS — BP 123/90 | HR 77 | Ht 64.0 in | Wt 294.0 lb

## 2020-03-08 DIAGNOSIS — Z789 Other specified health status: Secondary | ICD-10-CM

## 2020-03-08 DIAGNOSIS — Z6841 Body Mass Index (BMI) 40.0 and over, adult: Secondary | ICD-10-CM

## 2020-03-08 DIAGNOSIS — H538 Other visual disturbances: Secondary | ICD-10-CM | POA: Diagnosis not present

## 2020-03-08 DIAGNOSIS — E236 Other disorders of pituitary gland: Secondary | ICD-10-CM

## 2020-03-08 DIAGNOSIS — G43019 Migraine without aura, intractable, without status migrainosus: Secondary | ICD-10-CM | POA: Diagnosis not present

## 2020-03-08 DIAGNOSIS — G4733 Obstructive sleep apnea (adult) (pediatric): Secondary | ICD-10-CM

## 2020-03-08 MED ORDER — AJOVY 225 MG/1.5ML ~~LOC~~ SOAJ: 225.0000 mg | SUBCUTANEOUS | 3 refills | Status: DC

## 2020-03-08 NOTE — Patient Instructions (Addendum)
For your migraines, please continue Maxalt as needed, you may add another as needed medication such as Bernita Raisin on your check in the near future if needed.  For now, we will start you on a injectable preventative called Ajovy, 225 mg/1.5 ml, it an injection subcutaneously every 30 days.   Potential side effects include: Palpitations (as in fast heartbeat), hives, itching, rash, hoarseness, irritation at the injection site, joint pain and stiffness or joint swelling, swelling of the eyelids, face, lips, hands or feet, chest tightness, trouble breathing or swallowing.  Some side effects are mild and go away as your body gets used to the new medication.    If you have any serious side effects such as bleeding or blistering or discoloration of your skin, hives, significant itching or a rash, please seek immediate medical attention by going to the ER or calling 911.   Injection site reactions include itching, redness, pain at the injection site and Rare side effects include: Anaphylaxis (a severe allergic reaction), angioedema (severe swelling including around mouth and tongue).  I think it is imperative that we manage your severe obstructive sleep apnea in order to get your migraines under control.  In addition, on your recent brain MRI they saw what is called an empty sella, this is sometimes associated with increased fluid pressure on the brain.  I would like for you to be evaluated with an ophthalmologist which is an eye specialist to make sure there is no evidence of pressure on your eye nerves or optic nerves on both sides or one side.  Please continue to work on weight loss.  As discussed, I have made a referral to medical weight management through [facility], the clinic is called [facility].  To be able to tolerate CPAP a little better, I have made a referral for a mask refit, please get in touch with your DME company directly for this and we will reduce your pressure from 13 cm  to 11 cm for better tolerance.  Please continue using your CPAP regularly. While your insurance requires that you use CPAP at least 4 hours each night on 70% of the nights, I recommend, that you not skip any nights and use it throughout the night if you can. Getting used to CPAP and staying with the treatment long term does take time and patience and discipline. Untreated obstructive sleep apnea when it is moderate to severe can have an adverse impact on cardiovascular health and raise her risk for heart disease, arrhythmias, hypertension, congestive heart failure, stroke and diabetes. Untreated obstructive sleep apnea causes sleep disruption, nonrestorative sleep, and sleep deprivation. This can have an impact on your day to day functioning and cause daytime sleepiness and impairment of cognitive function, memory loss, mood disturbance, and problems focussing. Using CPAP regularly can improve these symptoms.

## 2020-03-08 NOTE — Progress Notes (Signed)
Subjective:    Patient ID: Felicia Salinas is a 29 y.o. female.  HPI     Interim history:   Ms. Felicia Salinas is a 29 year old right-handed woman with an underlying medical history of allergies, asthma, recurrent headaches and morbid obesity with a BMI of over 22, who presents for follow-up consultation of her severe obstructive sleep apnea. She is also referred by her primary care nurse practitioner for a new problem visit of migraines.  The patient missed a follow-up appointment on 11/23/2019.  I last saw her on 07/23/2019, at which time she reported reported struggling with her AutoPap.  She had difficulty tolerating the pressure and would also take the mask off in the middle of the night.  She was advised to return for a full night CPAP titration study to optimize treatment settings and mask fitting.  She was agreeable to this.  She had a CPAP titration study on 08/30/2019, during which time her sleep efficiency was 89.5%, sleep latency 31 minutes, REM latency delayed at 209 minutes.  She was fitted with a large Simplus full facemask and CPAP was initiated at 5 cm and titrated to a final pressure of 13 cm, at which time her AHI was 0/h with nonsupine REM sleep achieved an O2 nadir of 96%.  She was advised that start treatment at home in the form of CPAP as opposed to AutoPap therapy. Set up date was 05/13/19.  Today, 03/08/2020 (all dictated new, as well as above notes, some dictation done in note pad or Word, outside of chart, may appear as copied):   I reviewed her CPAP compliance data from 01/26/2020 through 02/24/2020, which is a total of 30 days, during which time she used her CPAP only 3 days, indicating poor compliance, in the past 90 days she used her machine 18 out of 90 days, also indicating very low compliance with an average usage of 5 hours and 36 minutes for days on treatment, percent use days greater than 4 hours at 16% only, average AHI 0.5/h, leak on the higher end with a 95th percentile at 21.8  L/min on a pressure of 13 cm with EPR of 3.  She reports having had worsening migraines for months.  She has at least 20 migraine days per month, associated with nausea, typically no vomiting and she has light sensitivity, she has noted occasional bilateral blurry vision.  She has had eye examinations through the [facility].  She has prescription eyeglasses.  She denies visual aura with her migraines and reports a frontal headache and sometimes at the base of her head.  She had medications through Dr. Kirstie Mirza clinic including baclofen, Zonegran, and Topamax.  Topamax did not help and Zonegran dulls the pain but did not help completely and baclofen did not help.  She is working on weight loss.  She is not followed by a weight management clinic.  She would be open to this.  The CPAP full facemask bothers her and she wakes up with worse headaches after trying to use the CPAP.  She would be willing to try a nasal mask.    She has been seeing Dr. Orie Rout for migraine management.  He is no longer covered under her new insurance.  She had trigger point injections as well.   She had a brain MRI with and without contrast on 07/09/2019 and I reviewed the results:    IMPRESSION: 1. Partially empty sella, often a normal anatomic variant can be associated with idiopathic intracranial hypertension (  pseudotumor cerebri). CSF opening pressure measurement would evaluate further. 2. Otherwise normal MRI appearance of the brain.  She had a cerebral angiogram on 06/02/2019 which was reported as normal without evidence of intracranial aneurysm or AV malformation or high flow fistula.  A prior brain MRA without contrast on 04/25/2019 showed: IMPRESSION: 1. 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 A Neuro-Endovascular consultation is suggested (i.e. with Neurosurgery or Neuro-interventional Radiology) to evaluate the appropriateness of potential treatment. 2.  Otherwise normal intracranial MRA.  The patient's allergies, current medications, family history, past medical history, past social history, past surgical history and problem list were reviewed and updated as appropriate.    Previously:     I first met her at the request of her primary care physician on 03/31/2019, at which time the patient reported snoring and excessive daytime somnolence as well as witnessed apneas.  She was advised to proceed with sleep study testing.  Her insurance denied a laboratory attended sleep study and therefore she was advised to proceed with a home sleep test. She had a home sleep test on 04/13/19, which indicated severe obstructive sleep apnea with a total AHI of 36.8/hour and O2 nadir of 76%. She was advised to start autoPAP therapy at home as her insurance did not approve an attended sleep study.    I reviewed her autoPAP compliance data from 06/23/2019 through 07/22/2019, which is a total of 30 days, during which time she used her machine 17 days with percent use days greater than 4 hours at 53%, indicating suboptimal compliance, average usage of 5 hours and 38 minutes, residual AHI at goal at 0.2/h, leak in the higher acceptable range with the 95th percentile at 18.5 L/min, average pressure for the 95th percentile at 12.3 cm with a range of 7 cm to 13 cm.  In the initial 30 days, set up date was 05/13/2019, she was better with her compliance, percent use days greater than 4 hours in the first month was 70% which was adequate, average usage and the rest of the compliance download showed similar findings, leak was just a little bit less and average pressure was also just a little bit less at 11.8 cm.      03/31/19: (She) reports snoring and excessive daytime somnolence, as well as witnessed apneas per husband's report and frequent morning headaches.  I reviewed your office note from 03/24/2019.  She has been referred to the headache wellness center.  She has never had a sleep  study.  Her Epworth sleepiness score is 14 out of 24, fatigue severity score is 40 out of 63.  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  She is currently working from home for [employer].  She is in bed generally around midnight and rise time is around 6 because she has to take the dogs out.  She has 2 dogs, they do not sleep in the bedroom with them.  She does have a TV on at night in her bedroom on a sleep timer.  She has no night to night nocturia but has woken up with a headache.  She had a tonsillectomy and adenoidectomy in sixth grade.  Her father has sleep apnea but she is unsure if he has a CPAP machine.  She is working on weight loss and has lost about 30 pounds thus far.  She is a non-smoker and does not utilize alcohol and does not drink caffeine on  a daily basis.  She denies any telltale symptoms of restless leg syndrome but has been noted to twitch or kicking her sleep per husband's feedback to her.   Her Past Medical History Is Significant For: Past Medical History:  Diagnosis Date  . Anxiety   . Asthma    controlled per patient last attack in January  . Environmental allergies   . Obesity     Her Past Surgical History Is Significant For: Past Surgical History:  Procedure Laterality Date  . APPENDECTOMY  04/07/2011  . CHOLECYSTECTOMY  10/18/2011   Procedure: LAPAROSCOPIC CHOLECYSTECTOMY WITH INTRAOPERATIVE CHOLANGIOGRAM;  Surgeon: Rolm Bookbinder, MD;  Location: [facility];  Service: General;  Laterality: N/A;  . IR ANGIO INTRA EXTRACRAN SEL COM CAROTID INNOMINATE UNI R MOD SED  06/02/2019  . IR ANGIO INTRA EXTRACRAN SEL INTERNAL CAROTID UNI L MOD SED  06/02/2019  . IR ANGIO VERTEBRAL SEL VERTEBRAL UNI R MOD SED  06/02/2019  . IR US GUIDE VASC ACCESS RIGHT  06/02/2019  . TONSILLECTOMY    . WISDOM TOOTH EXTRACTION      Her Family History Is Significant For: Family History  Problem Relation Age of Onset  . Cancer Mother        breast and spinal CA  . Asthma Mother    . Healthy Father   . Breast cancer Maternal Grandmother   . Diabetes Maternal Grandmother   . Rheum arthritis Maternal Grandmother   . Ovarian cancer Paternal Aunt   . Ovarian cancer Maternal Aunt   . Diabetes Sister   . Diabetes Paternal Grandfather   . Hypertension Maternal Grandfather   . Colon cancer Neg Hx     Her Social History Is Significant For: Social History   Socioeconomic History  . Marital status: Married    Spouse name: Not on file  . Number of children: 0  . Years of education: Not on file  . Highest education level: Not on file  Occupational History  . Occupation: Stage manager: [employer]  Tobacco Use  . Smoking status: Never Smoker  . Smokeless tobacco: Never Used  Vaping Use  . Vaping Use: Never used  Substance and Sexual Activity  . Alcohol use: Not Currently    Comment: Occasional  . Drug use: No  . Sexual activity: Yes    Birth control/protection: None  Other Topics Concern  . Not on file  Social History Narrative  . Not on file   Social Determinants of Health   Financial Resource Strain: Not on file  Food Insecurity: Not on file  Transportation Needs: Not on file  Physical Activity: Not on file  Stress: Not on file  Social Connections: Not on file    Her Allergies Are:  Allergies  Allergen Reactions  . Dairy Aid [Lactase] Anaphylaxis  . Shellfish Allergy Anaphylaxis  . Benadryl [Diphenhydramine Hcl] Hives  . Eggs Or Egg-Derived Products Hives and Rash  . Iodine Hives and Rash  . Tape Rash  :   Her Current Medications Are:  Outpatient Encounter Medications as of 03/08/2020  Medication Sig  . albuterol (VENTOLIN HFA) 108 (90 Base) MCG/ACT inhaler Inhale 2 puffs into the lungs every 6 (six) hours as needed for wheezing or shortness of breath.  . escitalopram (LEXAPRO) 10 MG tablet Take 1 tablet (10 mg total) by mouth daily.  . QUEtiapine (SEROQUEL) 100 MG tablet Take 1 tablet (100 mg total) by mouth at bedtime.  .  rizatriptan (MAXALT) 10 MG tablet Take  1 tablet (10 mg total) by mouth as needed for migraine. May repeat in 2 hours if needed  . [DISCONTINUED] baclofen (LIORESAL) 10 MG tablet   . [DISCONTINUED] cetirizine (ZYRTEC) 10 MG tablet Take 10 mg by mouth daily. (Patient not taking: Reported on 01/27/2020)  . [DISCONTINUED] zonisamide (ZONEGRAN) 100 MG capsule zonisamide 100 mg capsule  TAKE 2 CAPSULES BY MOUTH DAILY  . [DISCONTINUED] zonisamide (ZONEGRAN) 25 MG capsule Take 100 mg by mouth daily.   No facility-administered encounter medications on file as of 03/08/2020.  :  Review of Systems:  Out of a complete 14 point review of systems, all are reviewed and negative with the exception of these symptoms as listed below: Review of Systems  Neurological:       Pt presents today to discuss her migraines. Pt gets a migraine every other day and it can last for days. She has associated nausea/vomiting, light and sound sensitivity. She has tried zonegran as a preventative and it did not help. Maxalt helps as an abortive. Pt believes her cpap makes her migraines worse.    Objective:  Neurological Exam  Physical Exam Physical Examination:   Vitals:   03/08/20 0827  BP: 123/90  Pulse: 77    General Examination: The patient is a very pleasant 29 y.o. female in no acute distress. She appears well-developed and well-nourished and well groomed.   HEENT:Normocephalic, atraumatic, pupils are equal, round and reactive to light, extraocular tracking is good. Corrective eyeglasses in place.  Right eyelid is slightly droopy, appears stable, she states that she has a lazy eye.  She denies any blurry or double vision currently.  Funduscopic exam is difficult and I cannot exclude fuzziness around her optic disks.  Speech is clear without dysarthria, hypophonia or voice tremor.  Neck is supple, no carotid bruits.  Airway examination is stable.    Chest:Clear to auscultation without wheezing, rhonchi or  crackles noted.  Heart:S1+S2+0, regular and normal without murmurs, rubs or gallops noted.   Abdomen:Soft, non-tender and non-distended.  Extremities:There isnopitting edema in the distal lower extremities bilaterally.   Skin: Warm and dry without trophic changes noted.   Musculoskeletal: exam reveals no obvious joint deformities, tenderness or joint swelling or erythema.   Neurologically:  Mental status: The patient is awake, alert and oriented in all 4 spheres.Herimmediate and remote memory, attention, language skills and fund of knowledge are appropriate. There is no evidence of aphasia, agnosia, apraxia or anomia. Speech is clear with normal prosody and enunciation. Thought process is linear. Mood is normaland affect is normal.  Cranial nerves II - XII are as described above under HEENT exam.  Motor exam: Normal bulk, strength and tone is noted. There is no tremor, fine motor skills and coordination: grossly intact.  Reflexes are 1+ throughout, toes are downgoing. Cerebellar testing: No dysmetria or intention tremor. There is no truncal or gait ataxia.  Finger-to-nose is unremarkable.  Heel-to-shin is difficult for her due to body habitus but otherwise shows no dysmetria. Sensory exam: intact to light touch in the upper and lower extremities.  Gait, station and balance:Shestands easily. No veering to one side is noted. No leaning to one side is noted. Posture is age-appropriate and stance is narrow based. Gait showsnormalstride length and normalpace. No problems turning are noted. Romberg is negative, tandem walk unremarkable.  Assessmentand Plan:  In summary,Felicia Salinas a very pleasant 45 year oldfemalewith an underlying medical history of allergies, asthma, recurrent headaches and morbid obesity with a BMI of over  52., who presents for evaluation of her migraine headaches as well as follow-up consultation of her severe obstructive sleep apnea.  She had a home  sleep test in January 2021 and was initially on AutoPap therapy but had trouble tolerating it.  She had a subsequent CPAP titration and a treatment pressure of 13 cm was determined as optimal.  She has trouble with the full facemask and feels that the CPAP makes her migraine worse.  She has intractable migraines without aura.  She has tried and failed Topamax, baclofen and Zonegran through Dr. Domingo Cocking.  She also tried and failed trigger point injections.  She believes that her last injections were in or around August 2021.  She had a brain MRI earlier in the year which showed empty sella.  She has had some blurry vision.  She is advised to seek formal consultation and full evaluation with an ophthalmologist to make sure there is no excess fluid pressure on her optic nerves.  I explained the diagnosis of intracranial hypertension to her and management/diagnostic evaluation with a lumbar puncture.  We will await evaluation through ophthalmology first.  In addition, she is strongly advised to work on weight loss.  She is agreeable to a referral to medical weight management which I will initiate.  I also placed a referral to ophthalmology.  I suggested a mask refit through her DME company for better tolerance of her CPAP.  In addition, I recommended reducing her treatment pressure from 13 cm to 11 cm for now.  She is agreeable to this.  For migraine management, for prevention, I suggested we try Ajovy.  She has already tried traditional preventative oral medications.  She may also be a good candidate for one of the oral abortive medication such as Ubrelvy or Nurtec.  For now, she was recently started on Maxalt and has found it helpful.  She can maintain as needed use of Maxalt for now.  She is advised to stay well-hydrated.  We will reconvene in about 3 months, sooner if needed.  She is strongly advised to work on her CPAP usage as ultimately, treating her severe obstructive sleep apnea may help several of her  conditions including her obesity, her migraines, and possibly intracranial hypertension if she has it.   I answered all her questions today and she was in agreement.  I spent 40 minutes in total face-to-face time and in reviewing records during pre-charting, more than 50% of which was spent in counseling and coordination of care, reviewing test results, reviewing medications and treatment regimen and/or in discussing or reviewing the diagnosis of migraines, OSA, the prognosis and treatment options. Pertinent laboratory and imaging test results that were available during this visit with the patient were reviewed by me and considered in my medical decision making (see chart for details).

## 2020-03-08 NOTE — Progress Notes (Addendum)
Order for cpap pressure change and mask refit sent to [facility] via community message. Confirmation received that the order transmitted was successful.

## 2020-03-08 NOTE — Telephone Encounter (Signed)
Received a PA request for pt's Ajovy. Completed via phone call to [employer]. PA ref # [id number]. Should have a determination within 72 hours.

## 2020-03-12 MED FILL — AJOVY 225 MG/1.5ML SOAJ: 225 | 30 days supply | Qty: 2 | Fill #0

## 2020-03-14 NOTE — Telephone Encounter (Signed)
Received PA denial for ajovy. I called pt. She is already aware of the ajovy copay program and has gotten ajovy from her pharmacy.

## 2020-04-02 ENCOUNTER — Ambulatory Visit: Payer: No Typology Code available for payment source

## 2020-04-04 ENCOUNTER — Other Ambulatory Visit: Payer: Self-pay

## 2020-04-04 ENCOUNTER — Telehealth (INDEPENDENT_AMBULATORY_CARE_PROVIDER_SITE_OTHER): Payer: No Typology Code available for payment source | Admitting: Family Medicine

## 2020-04-04 ENCOUNTER — Ambulatory Visit: Payer: No Typology Code available for payment source

## 2020-04-04 ENCOUNTER — Other Ambulatory Visit (HOSPITAL_COMMUNITY): Payer: Self-pay | Admitting: Family Medicine

## 2020-04-04 ENCOUNTER — Encounter: Payer: Self-pay | Admitting: Family Medicine

## 2020-04-04 DIAGNOSIS — G43711 Chronic migraine without aura, intractable, with status migrainosus: Secondary | ICD-10-CM | POA: Diagnosis not present

## 2020-04-04 DIAGNOSIS — G43009 Migraine without aura, not intractable, without status migrainosus: Secondary | ICD-10-CM

## 2020-04-04 DIAGNOSIS — R110 Nausea: Secondary | ICD-10-CM | POA: Diagnosis not present

## 2020-04-04 DIAGNOSIS — R0602 Shortness of breath: Secondary | ICD-10-CM | POA: Diagnosis not present

## 2020-04-04 MED ORDER — ONDANSETRON HCL 4 MG PO TABS: 4.0000 mg | ORAL_TABLET | Freq: Three times a day (TID) | ORAL | 0 refills | Status: DC | PRN

## 2020-04-04 MED ORDER — RIZATRIPTAN BENZOATE 10 MG PO TABS
10.0000 mg | ORAL_TABLET | ORAL | 3 refills | Status: DC | PRN
Start: 2020-04-04 — End: 2020-09-29

## 2020-04-04 MED FILL — ONDANSETRON HCL 4 MG TABS: 4 | 7 days supply | Qty: 20 | Fill #0

## 2020-04-04 NOTE — Progress Notes (Signed)
Virtual Visit Note  I connected with patient on 04/04/20 at 0940 by telephone due to unable to work Epic video visit and verified that I am speaking with the correct person using two identifiers. Felicia Salinas is currently located at home and no family members are currently with them during visit. The provider, Azalee Course Korina Tretter, FNP is located in their office at time of visit.  I discussed the limitations, risks, security and privacy concerns of performing an evaluation and management service by telephone and the availability of in person appointments. I also discussed with the patient that there may be a patient responsible charge related to this service. The patient expressed understanding and agreed to proceed.   I provided 20 minutes of non-face-to-face time during this encounter.  Chief Complaint  Patient presents with  . Cough    Shortness of breath started Friday, got booster vaccine yesterday- chest hurts when inhales,  cold chills , has covid testing appt today     HPI ? Friday was having SOB Also having frequent headaches Sunday: covid booster Today cold chills, slight fever and nauseaus Symptoms did not improve prior to booster Will get covid test today Denies symptoms with first two covid vaccines Girl at work had covid symptoms at well Has been using albuterol inhaler Hasn't been taking anything else for her symptoms Still having SOB and cold chills  Neurologist gave her Ajovy, which is not working She messaged them and is awaiting reply Has been using the maxalt frequently  Taking 2 maxalt every 2 hours  Allergies  Allergen Reactions  . Dairy Aid [Lactase] Anaphylaxis  . Shellfish Allergy Anaphylaxis  . Benadryl [Diphenhydramine Hcl] Hives  . Eggs Or Egg-Derived Products Hives and Rash  . Iodine Hives and Rash  . Tape Rash    Prior to Admission medications   Medication Sig Start Date End Date Taking? Authorizing Provider  albuterol (VENTOLIN HFA) 108 (90  Base) MCG/ACT inhaler Inhale 2 puffs into the lungs every 6 (six) hours as needed for wheezing or shortness of breath. 01/27/20  Yes Adilen Pavelko, Azalee Course, FNP  escitalopram (LEXAPRO) 10 MG tablet Take 1 tablet (10 mg total) by mouth daily. 01/27/20  Yes Michele Judy, Azalee Course, FNP  Fremanezumab-vfrm (AJOVY) 225 MG/1.5ML SOAJ Inject 225 mg into the skin every 30 (thirty) days. 03/08/20  Yes Huston Foley, MD  QUEtiapine (SEROQUEL) 100 MG tablet Take 1 tablet (100 mg total) by mouth at bedtime. 01/27/20  Yes Sonoma Firkus, Azalee Course, FNP  rizatriptan (MAXALT) 10 MG tablet Take 1 tablet (10 mg total) by mouth as needed for migraine. May repeat in 2 hours if needed 02/11/20  Yes Aundray Cartlidge, Azalee Course, FNP    Past Medical History:  Diagnosis Date  . Anxiety   . Asthma    controlled per patient last attack in January  . Environmental allergies   . Obesity     Past Surgical History:  Procedure Laterality Date  . APPENDECTOMY  04/07/2011  . CHOLECYSTECTOMY  10/18/2011   Procedure: LAPAROSCOPIC CHOLECYSTECTOMY WITH INTRAOPERATIVE CHOLANGIOGRAM;  Surgeon: Emelia Loron, MD;  Location: [facility] OR;  Service: General;  Laterality: N/A;  . IR ANGIO INTRA EXTRACRAN SEL COM CAROTID INNOMINATE UNI R MOD SED  06/02/2019  . IR ANGIO INTRA EXTRACRAN SEL INTERNAL CAROTID UNI L MOD SED  06/02/2019  . IR ANGIO VERTEBRAL SEL VERTEBRAL UNI R MOD SED  06/02/2019  . IR US GUIDE VASC ACCESS RIGHT  06/02/2019  . TONSILLECTOMY    . WISDOM TOOTH  EXTRACTION      Social History   Tobacco Use  . Smoking status: Never Smoker  . Smokeless tobacco: Never Used  Substance Use Topics  . Alcohol use: Not Currently    Comment: Occasional    Family History  Problem Relation Age of Onset  . Cancer Mother        breast and spinal CA  . Asthma Mother   . Healthy Father   . Breast cancer Maternal Grandmother   . Diabetes Maternal Grandmother   . Rheum arthritis Maternal Grandmother   . Ovarian cancer Paternal Aunt   . Ovarian cancer Maternal Aunt   .  Diabetes Sister   . Diabetes Paternal Grandfather   . Hypertension Maternal Grandfather   . Colon cancer Neg Hx     Review of Systems  Constitutional: Positive for chills, fever and malaise/fatigue.  HENT: Negative for congestion, ear discharge, ear pain, sinus pain and sore throat.   Eyes: Negative for blurred vision and double vision.  Respiratory: Positive for shortness of breath. Negative for cough and wheezing.   Cardiovascular: Negative for chest pain, palpitations and leg swelling.  Gastrointestinal: Positive for nausea. Negative for abdominal pain, blood in stool, constipation, diarrhea, heartburn and vomiting.  Genitourinary: Negative for dysuria, frequency and hematuria.  Musculoskeletal: Negative for back pain, joint pain and myalgias.  Skin: Negative for rash.  Neurological: Positive for headaches. Negative for dizziness and weakness.    Objective  Constitutional:      General: Not in acute distress.    Appearance: Normal appearance. Not ill-appearing.   Pulmonary:     Effort: Pulmonary effort is normal. No respiratory distress.  Neurological:     Mental Status: Alert and oriented to person, place, and time.  Psychiatric:        Mood and Affect: Mood normal.        Behavior: Behavior normal.     ASSESSMENT and PLAN  Problem List Items Addressed This Visit      Cardiovascular and Mediastinum   Chronic migraine without aura, with intractable migraine, so stated, with status migrainosus   Relevant Medications   rizatriptan (MAXALT) 10 MG tablet   Migraine   Relevant Medications   rizatriptan (MAXALT) 10 MG tablet    Other Visit Diagnoses    Nausea without vomiting    -  Primary   Relevant Medications   ondansetron (ZOFRAN) 4 MG tablet   Shortness of breath       Relevant Orders   COVID-19, Flu A+B and RSV     Plan Discussed treating symptoms conservatively Encouraged to follow up with neurology in regard to headaches  Return if symptoms worsen or  fail to improve, for next scheduled visit.    The above assessment and management plan was discussed with the patient. The patient verbalized understanding of and has agreed to the management plan. Patient is aware to call the clinic if symptoms persist or worsen. Patient is aware when to return to the clinic for a follow-up visit. Patient educated on when it is appropriate to go to the emergency department.     Macario Carls Chontel Warning, FNP-BC Primary Care at [facility] [address] [place], [place] [address] Ph.  [phone] Fax [phone]

## 2020-04-04 NOTE — Patient Instructions (Addendum)
 COVID-19: What to Do if You Are Sick If you have a fever, cough or other symptoms, you might have COVID-19. Most people have mild illness and are able to recover at home. If you are sick:  Keep track of your symptoms.  If you have an emergency warning sign (including trouble breathing), call 911. Steps to help prevent the spread of COVID-19 if you are sick If you are sick with COVID-19 or think you might have COVID-19, follow the steps below to care for yourself and to help protect other people in your home and community. Stay home except to get medical care  Stay home. Most people with COVID-19 have mild illness and can recover at home without medical care. Do not leave your home, except to get medical care. Do not visit public areas.  Take care of yourself. Get rest and stay hydrated. Take over-the-counter medicines, such as acetaminophen, to help you feel better.  Stay in touch with your doctor. Call before you get medical care. Be sure to get care if you have trouble breathing, or have any other emergency warning signs, or if you think it is an emergency.  Avoid public transportation, ride-sharing, or taxis. Separate yourself from other people As much as possible, stay in a specific room and away from other people and pets in your home. If possible, you should use a separate bathroom. If you need to be around other people or animals in or outside of the home, wear a mask. Tell your close contactsthat they may have been exposed to COVID-19. An infected person can spread COVID-19 starting 48 hours (or 2 days) before the person has any symptoms or tests positive. By letting your close contacts know they may have been exposed to COVID-19, you are helping to protect everyone.  Additional guidance is available for those living in close quarters and shared housing.  See COVID-19 and Animals if you have questions about pets.  If you are diagnosed with COVID-19, someone from the health  department may call you. Answer the call to slow the spread. Monitor your symptoms  Symptoms of COVID-19 include fever, cough, or other symptoms.  Follow care instructions from your healthcare provider and local health department. Your local health authorities may give instructions on checking your symptoms and reporting information. When to seek emergency medical attention Look for emergency warning signs* for COVID-19. If someone is showing any of these signs, seek emergency medical care immediately:  Trouble breathing  Persistent pain or pressure in the chest  New confusion  Inability to wake or stay awake  Pale, gray, or blue-colored skin, lips, or nail beds, depending on skin tone *This list is not all possible symptoms. Please call your medical provider for any other symptoms that are severe or concerning to you. Call 911 or call ahead to your local emergency facility: Notify the operator that you are seeking care for someone who has or may have COVID-19. Call ahead before visiting your doctor  Call ahead. Many medical visits for routine care are being postponed or done by phone or telemedicine.  If you have a medical appointment that cannot be postponed, call your doctor's office, and tell them you have or may have COVID-19. This will help the office protect themselves and other patients. Get  tested  If you have symptoms of COVID-19, get tested. While waiting for test results, you stay away from others, including staying apart from those living in your household.  You can visit your state,   tribal, local, and territorialhealth department's website to look for the latest local information on testing sites. If you are sick, wear a mask over your nose and mouth  You should wear a mask over your nose and mouth if you must be around other people or animals, including pets (even at home).  You don't need to wear the mask if you are alone. If you can't put on a mask (because of  trouble breathing, for example), cover your coughs and sneezes in some other way. Try to stay at least 6 feet away from other people. This will help protect the people around you.  Masks should not be placed on young children under age 2 years, anyone who has trouble breathing, or anyone who is not able to remove the mask without help. Note: During the COVID-19 pandemic, medical grade facemasks are reserved for healthcare workers and some first responders. Cover your coughs and sneezes  Cover your mouth and nose with a tissue when you cough or sneeze.  Throw away used tissues in a lined trash can.  Immediately wash your hands with soap and water for at least 20 seconds. If soap and water are not available, clean your hands with an alcohol-based hand sanitizer that contains at least 60% alcohol. Clean your hands often  Wash your hands often with soap and water for at least 20 seconds. This is especially important after blowing your nose, coughing, or sneezing; going to the bathroom; and before eating or preparing food.  Use hand sanitizer if soap and water are not available. Use an alcohol-based hand sanitizer with at least 60% alcohol, covering all surfaces of your hands and rubbing them together until they feel dry.  Soap and water are the best option, especially if hands are visibly dirty.  Avoid touching your eyes, nose, and mouth with unwashed hands.  Handwashing Tips Avoid sharing personal household items  Do not share dishes, drinking glasses, cups, eating utensils, towels, or bedding with other people in your home.  Wash these items thoroughly after using them with soap and water or put in the dishwasher. Clean all "high-touch" surfaces everyday  Clean and disinfect high-touch surfaces in your "sick room" and bathroom; wear disposable gloves. Let someone else clean and disinfect surfaces in common areas, but you should clean your bedroom and bathroom, if possible.  If a  caregiver or other person needs to clean and disinfect a sick person's bedroom or bathroom, they should do so on an as-needed basis. The caregiver/other person should wear a mask and disposable gloves prior to cleaning. They should wait as long as possible after the person who is sick has used the bathroom before coming in to clean and use the bathroom. ? High-touch surfaces include phones, remote controls, counters, tabletops, doorknobs, bathroom fixtures, toilets, keyboards, tablets, and bedside tables.  Clean and disinfect areas that may have blood, stool, or body fluids on them.  Use household cleaners and disinfectants. Clean the area or item with soap and water or another detergent if it is dirty. Then, use a household disinfectant. ? Be sure to follow the instructions on the label to ensure safe and effective use of the product. Many products recommend keeping the surface wet for several minutes to ensure germs are killed. Many also recommend precautions such as wearing gloves and making sure you have good ventilation during use of the product. ? Use a product from EPA's List N: Disinfectants for Coronavirus (COVID-19). ? Complete Disinfection Guidance When you can   be around others after being sick with COVID-19 Deciding when you can be around others is different for different situations. Find out when you can safely end home isolation. For any additional questions about your care, contact your healthcare provider or state or local health department. 06/10/2019 Content source: National Center for Immunization and Respiratory Diseases (NCIRD), Division of Viral Diseases This information is not intended to replace advice given to you by your health care provider. Make sure you discuss any questions you have with your health care provider. Document Revised: 01/25/2020 Document Reviewed: 01/25/2020 Elsevier Patient Education  2021 Elsevier Inc.   If you have lab work done today you will be  contacted with your lab results within the next 2 weeks.  If you have not heard from us then please contact us. The fastest way to get your results is to register for My Chart.   IF you received an x-ray today, you will receive an invoice from [facility]. Please contact [facility] at [phone] with questions or concerns regarding your invoice.   IF you received labwork today, you will receive an invoice from [facility]. Please contact [facility] at [phone] with questions or concerns regarding your invoice.   Our billing staff will not be able to assist you with questions regarding bills from these companies.  You will be contacted with the lab results as soon as they are available. The fastest way to get your results is to activate your My Chart account. Instructions are located on the last page of this paperwork. If you have not heard from us regarding the results in 2 weeks, please contact this office.      

## 2020-04-04 NOTE — Addendum Note (Signed)
Addended by: Argentina Ponder on: 04/04/2020 11:08 AM   Modules accepted: Orders

## 2020-04-06 LAB — COVID-19, FLU A+B AND RSV
Influenza A, NAA: NOT DETECTED
Influenza B, NAA: NOT DETECTED
RSV, NAA: NOT DETECTED
SARS-CoV-2, NAA: NOT DETECTED

## 2020-04-12 ENCOUNTER — Telehealth: Payer: Self-pay | Admitting: Neurology

## 2020-04-12 NOTE — Telephone Encounter (Signed)
FYI, nothing needed at this time:    I received records from Dr. Onnie Boer office.  I reviewed office notes.  She had a visit on 04/28/2019, at which time analgesic medications tried included acetaminophen, aspirin, BC or Goody powder, butalbital, codeine, Excedrin, oxycodone and tramadol.  Additional medications that were tried in the past were listed as follows: Benadryl, ondansetron, ibuprofen and naproxen, cyclobenzaprine, topiramate, prednisone, Tylenol PM, amitriptyline, escitalopram.  She was advised to stop topiramate due to lack of efficacy and mild cognitive changes.  She was advised to discontinue amitriptyline and discontinue and avoid Fioricet and butalbital medications.  She was advised to obtain a CPAP through a sleep provider.  She was advised that stress was a trigger and she was supposed to have biofeedback.  May need lumbar puncture to evaluate for IIA H.  Weight loss was discussed.  She had a brain MRI with and without contrast on 07/09/2019 and reported it is in her chart.  She had a follow-up appointment with Dr. Neale Burly on 09/07/2019.  She had trigger point injections/nerve block at the time.  She had a follow-up appointment on 10/28/2019 with Dr. Neale Burly and had repeat trigger point injections/nerve block at the time.  She had another procedure on 12/10/2019 for trigger point injections.

## 2020-04-18 ENCOUNTER — Encounter: Payer: No Typology Code available for payment source | Admitting: Family Medicine

## 2020-04-19 ENCOUNTER — Encounter: Payer: Self-pay | Admitting: Neurology

## 2020-04-20 MED FILL — AJOVY 225 MG/1.5ML SOAJ: 225 | 30 days supply | Qty: 2 | Fill #1

## 2020-04-25 MED FILL — QUETIAPINE FUMARATE 100 MG: 100 | 30 days supply | Qty: 30 | Fill #1

## 2020-04-25 MED FILL — ESCITALOPRAM 10 MG TABLET: 10 | 90 days supply | Qty: 90 | Fill #1

## 2020-04-25 MED FILL — ALBUTEROL SULFATE HFA 108 (: 108 (90 BAS | 25 days supply | Qty: 18 | Fill #1

## 2020-04-26 MED FILL — RIZATRIPTAN BENZOATE 10 MG: 10 | 90 days supply | Qty: 40 | Fill #0

## 2020-05-05 NOTE — Progress Notes (Signed)
Name: Felicia Salinas  MRN/ DOB: [id number], Nov 26, 1990    Age/ Sex: 30 y.o., female    PCP: Just, Azalee Course, FNP   Reason for Endocrinology Evaluation: PCOS     Date of Initial Endocrinology Evaluation: 05/06/2020     HPI: Ms. Felicia Salinas is a 30 y.o. female with a past medical history of Asthma, migraine headaches and OSA on CPAP. The patient presented for initial endocrinology clinic visit on 05/06/2020 for consultative assistance with her PCOS.   She was diagnosed with PCOS a few years ago through her Gyn Milton Ferguson) during evaluation for infetility .   Per pt she believes this was diagnosed base don blood testing . She was a  During evaluation for chronic headaches she was noted to have partial empty sella on MRI of the brain dated 06/2019. She continues with headaches, denies visual changes .   She  Continues to be sexually active,  Has been trying to conceive for years.   LMP 04/14/2020 , they are regular  Weight gain started in [employer]  Has noted Hirsutism on her face  NO Hx of abdominal ultrasound but had hysteroscopy which was normal   No FH of infertility, but has FH DM. No FH of hirsutism    She exercises ( cardio ) 3x a week   She drinks sugar-sweetened beverages     Has sisters and a brother - little sister with DM     HISTORY:  Past Medical History:  Past Medical History:  Diagnosis Date  . Anxiety   . Asthma    controlled per patient last attack in January  . Environmental allergies   . Obesity    Past Surgical History:  Past Surgical History:  Procedure Laterality Date  . APPENDECTOMY  04/07/2011  . CHOLECYSTECTOMY  10/18/2011   Procedure: LAPAROSCOPIC CHOLECYSTECTOMY WITH INTRAOPERATIVE CHOLANGIOGRAM;  Surgeon: Emelia Loron, MD;  Location: [facility] OR;  Service: General;  Laterality: N/A;  . IR ANGIO INTRA EXTRACRAN SEL COM CAROTID INNOMINATE UNI R MOD SED  06/02/2019  . IR ANGIO INTRA EXTRACRAN SEL INTERNAL CAROTID UNI L MOD SED  06/02/2019  .  IR ANGIO VERTEBRAL SEL VERTEBRAL UNI R MOD SED  06/02/2019  . IR US GUIDE VASC ACCESS RIGHT  06/02/2019  . TONSILLECTOMY    . WISDOM TOOTH EXTRACTION        Social History:  reports that she has never smoked. She has never used smokeless tobacco. She reports previous alcohol use. She reports that she does not use drugs.  Family History: family history includes Asthma in her mother; Breast cancer in her maternal grandmother; Cancer in her mother; Diabetes in her maternal grandmother, paternal grandfather, and sister; Healthy in her father; Hypertension in her maternal grandfather; Ovarian cancer in her maternal aunt and paternal aunt; Rheum arthritis in her maternal grandmother.   HOME MEDICATIONS: Allergies as of 05/06/2020      Reactions   Dairy Aid [lactase] Anaphylaxis   Shellfish Allergy Anaphylaxis   Benadryl [diphenhydramine Hcl] Hives   Eggs Or Egg-derived Products Hives, Rash   Iodine Hives, Rash   Tape Rash      Medication List       Accurate as of May 06, 2020  8:55 AM. If you have any questions, ask your nurse or doctor.        Ajovy 225 MG/1.5ML Soaj Generic drug: Fremanezumab-vfrm Inject 225 mg into the skin every 30 (thirty) days.   albuterol 108 (90 Base) MCG/ACT  inhaler Commonly known as: VENTOLIN HFA Inhale 2 puffs into the lungs every 6 (six) hours as needed for wheezing or shortness of breath.   escitalopram 10 MG tablet Commonly known as: Lexapro Take 1 tablet (10 mg total) by mouth daily.   ondansetron 4 MG tablet Commonly known as: Zofran Take 1 tablet (4 mg total) by mouth every 8 (eight) hours as needed for nausea or vomiting.   QUEtiapine 100 MG tablet Commonly known as: SEROquel Take 1 tablet (100 mg total) by mouth at bedtime.   rizatriptan 10 MG tablet Commonly known as: Maxalt Take 1 tablet (10 mg total) by mouth as needed for migraine. May repeat in 2 hours if needed         REVIEW OF SYSTEMS: A comprehensive ROS was conducted  with the patient and is negative except as per HPI and below:  Review of Systems  Gastrointestinal: Negative for diarrhea and nausea.       OBJECTIVE:  VS: BP 126/80   Pulse 91   Ht 5\' 4"  (1.626 m)   Wt (!) 301 lb 6 oz (136.7 kg)   LMP 04/14/2020   SpO2 98%   BMI 51.73 kg/m    Wt Readings from Last 3 Encounters:  05/06/20 (!) 301 lb 6 oz (136.7 kg)  03/08/20 294 lb (133.4 kg)  01/27/20 293 lb (132.9 kg)     EXAM: General: Pt appears well and is in NAD  Neck: General: Supple without adenopathy. Thyroid: Thyroid size normal.  No goiter or nodules appreciated. No thyroid bruit.  Lungs: Clear with good BS bilat with no rales, rhonchi, or wheezes  Heart: Auscultation: RRR.  Abdomen: Normoactive bowel sounds, soft, nontender, without masses or organomegaly palpable  Extremities:  BL LE: No pretibial edema normal ROM and strength.  Skin: Hair: Dark thick hair around the chin area. Rest of hair growth is normal  Skin Inspection: No rashes Skin Palpation: Skin temperature, texture, and thickness normal to palpation  Neuro: Cranial nerves: II - XII grossly intact  Motor: Normal strength throughout DTRs: 2+ and symmetric in UE without delay in relaxation phase  Mental Status: Judgment, insight: Intact Orientation: Oriented to time, place, and person Mood and affect: No depression, anxiety, or agitation     DATA REVIEWED:  Results for RHYTHM, GUBBELS (MRN [name]) as of 05/09/2020 08:52  Ref. Range 05/06/2020 09:31  Sodium Latest Ref Range: 135 - 145 mEq/L 138  Potassium Latest Ref Range: 3.5 - 5.1 mEq/L 4.0  Chloride Latest Ref Range: 96 - 112 mEq/L 102  CO2 Latest Ref Range: 19 - 32 mEq/L 30  Glucose Latest Ref Range: 70 - 99 mg/dL 79  BUN Latest Ref Range: 6 - 23 mg/dL 10  Creatinine Latest Ref Range: 0.40 - 1.20 mg/dL 07/04/2020  Calcium Latest Ref Range: 8.4 - 10.5 mg/dL 9.2  GFR Latest Ref Range: >60.00 mL/min 112.96  Cortisol, Plasma Latest Units: ug/dL 7.2  LH Latest  Units: mIU/mL 2.40  FSH Latest Units: mIU/ML 3.4  Prolactin Latest Units: ng/mL 6.4  Estradiol Latest Units: pg/mL 103  Preg, Serum Unknown NEGATIVE  Testosterone, Total, LC-MS-MS Latest Ref Range: 2 - 45 ng/dL 43  TSH Latest Ref Range: 0.35 - 4.50 uIU/mL 1.33  T4,Free(Direct) Latest Ref Range: 0.60 - 1.60 ng/dL 8.52     MRI 7.78  Brain: Stable cerebral volume. No restricted diffusion to suggest acute infarction. No midline shift, mass effect, evidence of mass lesion, ventriculomegaly, extra-axial collection or acute intracranial hemorrhage. Cervicomedullary junction  within normal limits.  Wallace Cullens and white matter signal is within normal limits throughout the brain. No chronic cerebral blood products. No abnormal enhancement identified. No dural thickening.  Borderline to mild partially empty sella (series 10, image 19).  Vascular: Major intracranial vascular flow voids are preserved. The major dural venous sinuses are enhancing and appear to be patent.  Skull and upper cervical spine: Negative visible cervical spine. Visualized bone marrow signal is within normal limits.  Sinuses/Orbits: Negative orbits. Trace paranasal sinus mucosal thickening.  Other: Mastoid air cells are clear. Visible internal auditory structures appear normal. Scalp and face soft tissues appear negative.  IMPRESSION: 1. Partially empty sella, often a normal anatomic variant can be associated with idiopathic intracranial hypertension (pseudotumor cerebri). CSF opening pressure measurement would evaluate further. 2. Otherwise normal MRI appearance of the brain.  ASSESSMENT/PLAN/RECOMMENDATIONS:   1. PCOS:   - Per pt she has been diagnosed with PCOS but I explained to her that I personally can not make this diagnosis at this time.  Women who have PCOS has to meet at least 2 of 3 [place] criteria (oligo or amenorrhea, hyperandrogenism, polycystic ovaries per [place]. ) But at this time she only  has hirsutism. Testosterone is normal, menstruations have been regular and I am going to proceed with pelvic ultrasound.    2. Hirsutism :   - This is idiopathic and localized to the chin area - Testosterone is normal.  - We discussed first line of treatment is Combined OCP followed by Spironolactone which is teratogenic and since she is interested in conception, this is not a good option at this time    2. Partial Empty Sella:   - No clinical evidence of hypopituitarism, will proceed with pituitary hormone check up  - Most likely a normal variant     3. Infertility :    - This seems to be her main issues, I explained to her that Infertility is beyond the scope of adult endocrinology, She will need to see reproductive endocrinologist - In the meantime she was encouraged to lose weight to help with ovulation  - Will refer her to reproductive endocrinology      F/U in 6 months     Signed electronically by: Lyndle Herrlich, MD  [facility] Endocrinology  [facility] [address] [place] [place], [place] [address] Phone: [phone] FAX: [phone]   CC: Tobie Poet, FNP [address] [place] [address] Phone: [phone] Fax: [phone]   Return to Endocrinology clinic as below: Future Appointments  Date Time Provider Department Center  06/06/2020  8:30 AM Huston Foley, MD [facility] None  07/05/2020  1:20 PM Just, Azalee Course, FNP [facility]

## 2020-05-06 ENCOUNTER — Encounter: Payer: Self-pay | Admitting: Internal Medicine

## 2020-05-06 ENCOUNTER — Ambulatory Visit: Payer: No Typology Code available for payment source | Admitting: Internal Medicine

## 2020-05-06 ENCOUNTER — Other Ambulatory Visit: Payer: Self-pay

## 2020-05-06 VITALS — BP 126/80 | HR 91 | Ht 64.0 in | Wt 301.4 lb

## 2020-05-06 DIAGNOSIS — E236 Other disorders of pituitary gland: Secondary | ICD-10-CM | POA: Diagnosis not present

## 2020-05-06 DIAGNOSIS — N979 Female infertility, unspecified: Secondary | ICD-10-CM | POA: Diagnosis not present

## 2020-05-06 DIAGNOSIS — L680 Hirsutism: Secondary | ICD-10-CM | POA: Insufficient documentation

## 2020-05-06 LAB — BASIC METABOLIC PANEL
BUN: 10 mg/dL (ref 6–23)
CO2: 30 mEq/L (ref 19–32)
Calcium: 9.2 mg/dL (ref 8.4–10.5)
Chloride: 102 mEq/L (ref 96–112)
Creatinine, Ser: 0.71 mg/dL (ref 0.40–1.20)
GFR: 112.96 mL/min (ref >60.00)
Glucose, Bld: 79 mg/dL (ref 70–99)
Potassium: 4 mEq/L (ref 3.5–5.1)
Sodium: 138 mEq/L (ref 135–145)

## 2020-05-06 LAB — T4, FREE: Free T4: 0.78 ng/dL (ref 0.60–1.60)

## 2020-05-06 LAB — CORTISOL: Cortisol, Plasma: 7.2 ug/dL

## 2020-05-06 LAB — TSH: TSH: 1.33 u[IU]/mL (ref 0.35–4.50)

## 2020-05-06 NOTE — Patient Instructions (Addendum)
-   Please avoid sugar-sweetened beverages - Please follow a Keto diet, this has shown to help women with prior diagnosis of Polycytic Ovary Syndrome    - I have put in a referral to [facility] Located in: [facility] Address: [address], [place], [place] [address] Phone: [phone]    24-Hour Urine Collection   You will be collecting your urine for a 24-hour period of time.  Your timer starts with your first urine of the morning (For example - If you first pee at 9AM, your timer will start at 9AM)  Throw away your first urine of the morning  Collect your urine every time you pee for the next 24 hours STOP your urine collection 24 hours after you started the collection (For example - You would stop at 9AM the day after you started)

## 2020-05-09 LAB — LUTEINIZING HORMONE: LH: 2.4 m[IU]/mL

## 2020-05-09 LAB — FOLLICLE STIMULATING HORMONE: FSH: 3.4 m[IU]/mL

## 2020-05-10 ENCOUNTER — Other Ambulatory Visit: Payer: Self-pay

## 2020-05-10 ENCOUNTER — Other Ambulatory Visit: Payer: No Typology Code available for payment source

## 2020-05-10 DIAGNOSIS — E236 Other disorders of pituitary gland: Secondary | ICD-10-CM

## 2020-05-11 LAB — 17-HYDROXYPROGESTERONE: 17-OH-Progesterone, LC/MS/MS: 125 ng/dL

## 2020-05-11 LAB — INSULIN-LIKE GROWTH FACTOR
IGF-I, LC/MS: 189 ng/mL (ref 53–331)
Z-Score (Female): 0.5 SD (ref ?–2.0)

## 2020-05-11 LAB — ESTRADIOL: Estradiol: 103 pg/mL

## 2020-05-11 LAB — DHEA-SULFATE: DHEA-SO4: 139 ug/dL (ref 19–237)

## 2020-05-11 LAB — PROLACTIN: Prolactin: 6.4 ng/mL

## 2020-05-11 LAB — HCG, SERUM, QUALITATIVE: Preg, Serum: NEGATIVE

## 2020-05-11 LAB — TESTOSTERONE, TOTAL, LC/MS/MS: Testosterone, Total, LC-MS-MS: 43 ng/dL (ref 2–45)

## 2020-05-11 LAB — ACTH: C206 ACTH: 21 pg/mL (ref 6–50)

## 2020-05-16 LAB — CORTISOL, URINE, 24 HOUR
24 Hour urine volume (VMAHVA): 1000 mL
CREATININE, URINE: 1.38 g/(24.h) (ref 0.50–2.15)
Cortisol (Ur), Free: 14.2 mcg/24 h (ref 4.0–50.0)

## 2020-05-19 ENCOUNTER — Telehealth: Payer: Self-pay | Admitting: Neurology

## 2020-05-19 NOTE — Telephone Encounter (Signed)
I received an office note from Dr. Ernesto Rutherford with [facility], patient visit from 05/17/2020.  Visual fields were noted to be full, OCT showed possible slight nerve edema.  He suggested monitoring and repeat check in 1 year.  He did not see any obvious papilledema.  Follow-up in 1 year was recommended.  FYI, nothing needed.

## 2020-05-20 ENCOUNTER — Encounter (INDEPENDENT_AMBULATORY_CARE_PROVIDER_SITE_OTHER): Payer: Self-pay

## 2020-05-25 ENCOUNTER — Ambulatory Visit
Admission: RE | Admit: 2020-05-25 | Discharge: 2020-05-25 | Disposition: A | Discharge status: Home / Self Care | Payer: No Typology Code available for payment source | Source: Ambulatory Visit | Attending: Internal Medicine | Admitting: Internal Medicine

## 2020-05-25 DIAGNOSIS — N979 Female infertility, unspecified: Secondary | ICD-10-CM

## 2020-06-06 ENCOUNTER — Ambulatory Visit: Payer: No Typology Code available for payment source | Admitting: Neurology

## 2020-06-06 ENCOUNTER — Telehealth: Payer: No Typology Code available for payment source | Admitting: Physician Assistant

## 2020-06-06 ENCOUNTER — Encounter: Payer: Self-pay | Admitting: Neurology

## 2020-06-06 ENCOUNTER — Other Ambulatory Visit: Payer: Self-pay

## 2020-06-06 ENCOUNTER — Other Ambulatory Visit (HOSPITAL_COMMUNITY): Payer: Self-pay | Admitting: Physician Assistant

## 2020-06-06 VITALS — BP 124/86 | HR 90 | Ht 65.0 in | Wt 303.0 lb

## 2020-06-06 DIAGNOSIS — G43019 Migraine without aura, intractable, without status migrainosus: Secondary | ICD-10-CM

## 2020-06-06 DIAGNOSIS — R112 Nausea with vomiting, unspecified: Secondary | ICD-10-CM

## 2020-06-06 DIAGNOSIS — G4733 Obstructive sleep apnea (adult) (pediatric): Secondary | ICD-10-CM | POA: Diagnosis not present

## 2020-06-06 MED ORDER — ONDANSETRON 4 MG PO TBDP: 4.0000 mg | ORAL_TABLET | Freq: Three times a day (TID) | ORAL | 0 refills | Status: DC | PRN

## 2020-06-06 MED ORDER — AJOVY 225 MG/1.5ML ~~LOC~~ SOAJ
225.0000 mg | SUBCUTANEOUS | 5 refills | Status: DC
Start: 2020-06-06 — End: 2020-07-05

## 2020-06-06 NOTE — Progress Notes (Signed)
I have spent 5 minutes in review of e-visit questionnaire, review and updating patient chart, medical decision making and response to patient.   Adolf Ormiston Cody Cecil Bixby, PA-C    

## 2020-06-06 NOTE — Progress Notes (Signed)
We are sorry that you are not feeling well. Here is how we plan to help!  Based on what you have shared with me it looks like you have vomiting as a symptom of your migraine headaches.   I have prescribed a medication that will help alleviate your symptoms and allow you to stay hydrated:  Zofran 4 mg 1 tablet every 8 hours as needed for nausea and vomiting.  I see you are prescribed several medications for headaches so I want you to contact your PCP or headache specialist to for further management of your migraine headaches, as we do not treat these via e-visit.  HOME CARE:  Drink clear liquids.  This is very important! Dehydration (the lack of fluid) can lead to a serious complication.  Start off with 1 tablespoon every 5 minutes for 8 hours.  You may begin eating bland foods after 8 hours without vomiting.  Start with saltine crackers, white bread, rice, mashed potatoes, applesauce.  After 48 hours on a bland diet, you may resume a normal diet.  Try to go to sleep.  Sleep often empties the stomach and relieves the need to vomit.  GET HELP RIGHT AWAY IF:   Your symptoms do not improve or worsen within 2 days after treatment.  You have a fever for over 3 days.  You cannot keep down fluids after trying the medication.  MAKE SURE YOU:   Understand these instructions.  Will watch your condition.  Will get help right away if you are not doing well or get worse.   Thank you for choosing an e-visit. Your e-visit answers were reviewed by a board certified advanced clinical practitioner to complete your personal care plan. Depending upon the condition, your plan could have included both over the counter or prescription medications. Please review your pharmacy choice. Be sure that the pharmacy you have chosen is open so that you can pick up your prescription now.  If there is a problem you may message your provider in MyChart to have the prescription routed to another pharmacy. Your  safety is important to [place]. If you have drug allergies check your prescription carefully.  For the next 24 hours, you can use MyChart to ask questions about today's visit, request a non-urgent call back, or ask for a work or school excuse from your e-visit provider. You will get an e-mail in the next two days asking about your experience. I hope that your e-visit has been valuable and will speed your recovery.

## 2020-06-06 NOTE — Patient Instructions (Signed)
For your headaches, I recommend you continue with the Ajovy injections once a month.  I think it is important for your eye health and for your overall cardiovascular health as well as your headaches you start using your CPAP machine.    Please continue using your CPAP regularly. While your insurance requires that you use CPAP at least 4 hours each night on 70% of the nights, I recommend, that you not skip any nights and use it throughout the night if you can. Getting used to CPAP and staying with the treatment long term does take time and patience and discipline. Untreated obstructive sleep apnea when it is moderate to severe can have an adverse impact on cardiovascular health and raise her risk for heart disease, arrhythmias, hypertension, congestive heart failure, stroke and diabetes. Untreated obstructive sleep apnea causes sleep disruption, nonrestorative sleep, and sleep deprivation. This can have an impact on your day to day functioning and cause daytime sleepiness and impairment of cognitive function, memory loss, mood disturbance, and problems focussing. Using CPAP regularly can improve these symptoms.  Follow-up routinely to see one of our nurse practitioners in 3 months, please continue to work on weight loss.

## 2020-06-06 NOTE — Progress Notes (Signed)
Subjective:    Patient ID: Felicia Salinas is a 30 y.o. female.  HPI     Interim history:   Felicia Salinas is a 30 year old right-handed woman with an underlying medical history of allergies, asthma, recurrent headaches and morbid obesity with a BMI of over 71, who presents for follow-up consultation of her severe obstructive sleep apnea and recurrent headaches. I Last saw him on 03/08/2020, at which time we talked about her sleep study results.  She was having migrainous headaches.  She had previously followed with Dr. Domingo Cocking for migraines and had been on baclofen, Zonegran and Topamax.  She was working on weight loss.  She was willing to try a CPAP nasal mask.  She was encouraged to be consistent with using her CPAP.  I received an interim office report from Dr. [facility].  She had possible mild swelling of the optic nerves but no florid papilledema.  She was advised to follow-up routinely in 1 year with Dr. Katy Fitch.  She was advised to start Ajovy injections.  She was advised to continue Maxalt as needed.  Today, 06/06/2020: I reviewed her CPAP compliance data for the past 90 days from 01/31/2020 through 04/29/2020, during which time she used her machine only 3 days.  She did not restart using her machine after her visit from 03/08/2020.  She reports that the new job injections may have help a little bit.  Maxalt seems to reduce the severity of her headache when she takes it.  She has just now recently received supplies for her CPAP she states.  She has not used it yet.  She planted and last night.  She is willing to get back on her CPAP consistently.  She had an eye examination a month ago.  She was told to follow-up in 1 year.  She has not had any acute eye symptoms.  She has not had any new symptoms.    Previously:    I reviewed her CPAP compliance data from 01/26/2020 through 02/24/2020, which is a total of 30 days, during which time she used her CPAP only 3 days, indicating poor compliance, in the  past 90 days she used her machine 18 out of 90 days, also indicating very low compliance with an average usage of 5 hours and 36 minutes for days on treatment, percent use days greater than 4 hours at 16% only, average AHI 0.5/h, leak on the higher end with a 95th percentile at 21.8 L/min on a pressure of 13 cm with EPR of 3.    She had a brain MRI with and without contrast on 07/09/2019 and I reviewed the results:    IMPRESSION: 1. Partially empty sella, often a normal anatomic variant can be associated with idiopathic intracranial hypertension (pseudotumor cerebri). CSF opening pressure measurement would evaluate further. 2. Otherwise normal MRI appearance of the brain.   She had a cerebral angiogram on 06/02/2019 which was reported as normal without evidence of intracranial aneurysm or AV malformation or high flow fistula.   A prior brain MRA without contrast on 04/25/2019 showed: IMPRESSION: 1. 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 A Neuro-Endovascular consultation is suggested (i.e. with Neurosurgery or Neuro-interventional Radiology) to evaluate the appropriateness of potential treatment. 2. Otherwise normal intracranial MRA.   The patient's allergies, current medications, family history, past medical history, past social history, past surgical history and problem list were reviewed and updated as appropriate.      The patient missed a  follow-up appointment on 11/23/2019.  I saw her on 07/23/2019, at which time she reported reported struggling with her AutoPap.  She had difficulty tolerating the pressure and would also take the mask off in the middle of the night.  She was advised to return for a full night CPAP titration study to optimize treatment settings and mask fitting.  She was agreeable to this.   She had a CPAP titration study on 08/30/2019, during which time her sleep efficiency was 89.5%, sleep latency 31 minutes, REM latency  delayed at 209 minutes.  She was fitted with a large Simplus full facemask and CPAP was initiated at 5 cm and titrated to a final pressure of 13 cm, at which time her AHI was 0/h with nonsupine REM sleep achieved an O2 nadir of 96%.  She was advised that start treatment at home in the form of CPAP as opposed to AutoPap therapy. Set up date was 05/13/19.     I first met her at the request of her primary care physician on 03/31/2019, at which time the patient reported snoring and excessive daytime somnolence as well as witnessed apneas.  She was advised to proceed with sleep study testing.  Her insurance denied a laboratory attended sleep study and therefore she was advised to proceed with a home sleep test. She had a home sleep test on 04/13/19, which indicated severe obstructive sleep apnea with a total AHI of 36.8/hour and O2 nadir of 76%. She was advised to start autoPAP therapy at home as her insurance did not approve an attended sleep study.    I reviewed her autoPAP compliance data from 06/23/2019 through 07/22/2019, which is a total of 30 days, during which time she used her machine 17 days with percent use days greater than 4 hours at 53%, indicating suboptimal compliance, average usage of 5 hours and 38 minutes, residual AHI at goal at 0.2/h, leak in the higher acceptable range with the 95th percentile at 18.5 L/min, average pressure for the 95th percentile at 12.3 cm with a range of 7 cm to 13 cm.  In the initial 30 days, set up date was 05/13/2019, she was better with her compliance, percent use days greater than 4 hours in the first month was 70% which was adequate, average usage and the rest of the compliance download showed similar findings, leak was just a little bit less and average pressure was also just a little bit less at 11.8 cm.      03/31/19: (She) reports snoring and excessive daytime somnolence, as well as witnessed apneas per husband's report and frequent morning headaches.  I reviewed your  office note from 03/24/2019.  She has been referred to the headache wellness center.  She has never had a sleep study.  Her Epworth sleepiness score is 14 out of 24, fatigue severity score is 40 out of 63.  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  She is currently working from home for [employer].  She is in bed generally around midnight and rise time is around 6 because she has to take the dogs out.  She has 2 dogs, they do not sleep in the bedroom with them.  She does have a TV on at night in her bedroom on a sleep timer.  She has no night to night nocturia but has woken up with a headache.  She had a tonsillectomy and adenoidectomy in sixth grade.  Her father has sleep apnea  but she is unsure if he has a CPAP machine.  She is working on weight loss and has lost about 30 pounds thus far.  She is a non-smoker and does not utilize alcohol and does not drink caffeine on a daily basis.  She denies any telltale symptoms of restless leg syndrome but has been noted to twitch or kicking her sleep per husband's feedback to her.   Her Past Medical History Is Significant For: Past Medical History:  Diagnosis Date  . Anxiety   . Asthma    controlled per patient last attack in January  . Environmental allergies   . Obesity     Her Past Surgical History Is Significant For: Past Surgical History:  Procedure Laterality Date  . APPENDECTOMY  04/07/2011  . CHOLECYSTECTOMY  10/18/2011   Procedure: LAPAROSCOPIC CHOLECYSTECTOMY WITH INTRAOPERATIVE CHOLANGIOGRAM;  Surgeon: Rolm Bookbinder, MD;  Location: [facility];  Service: General;  Laterality: N/A;  . IR ANGIO INTRA EXTRACRAN SEL COM CAROTID INNOMINATE UNI R MOD SED  06/02/2019  . IR ANGIO INTRA EXTRACRAN SEL INTERNAL CAROTID UNI L MOD SED  06/02/2019  . IR ANGIO VERTEBRAL SEL VERTEBRAL UNI R MOD SED  06/02/2019  . IR US GUIDE VASC ACCESS RIGHT  06/02/2019  . TONSILLECTOMY    . WISDOM TOOTH EXTRACTION      Her Family History Is Significant  For: Family History  Problem Relation Age of Onset  . Cancer Mother        breast and spinal CA  . Asthma Mother   . Healthy Father   . Breast cancer Maternal Grandmother   . Diabetes Maternal Grandmother   . Rheum arthritis Maternal Grandmother   . Ovarian cancer Paternal Aunt   . Ovarian cancer Maternal Aunt   . Diabetes Sister   . Diabetes Paternal Grandfather   . Hypertension Maternal Grandfather   . Colon cancer Neg Hx     Her Social History Is Significant For: Social History   Socioeconomic History  . Marital status: Married    Spouse name: Not on file  . Number of children: 0  . Years of education: Not on file  . Highest education level: Not on file  Occupational History  . Occupation: Stage manager: [employer]  Tobacco Use  . Smoking status: Never Smoker  . Smokeless tobacco: Never Used  Vaping Use  . Vaping Use: Never used  Substance and Sexual Activity  . Alcohol use: Not Currently    Comment: Occasional  . Drug use: No  . Sexual activity: Yes    Birth control/protection: None  Other Topics Concern  . Not on file  Social History Narrative  . Not on file   Social Determinants of Health   Financial Resource Strain: Not on file  Food Insecurity: Not on file  Transportation Needs: Not on file  Physical Activity: Not on file  Stress: Not on file  Social Connections: Not on file    Her Allergies Are:  Allergies  Allergen Reactions  . Dairy Aid [Lactase] Anaphylaxis  . Shellfish Allergy Anaphylaxis  . Benadryl [Diphenhydramine Hcl] Hives  . Eggs Or Egg-Derived Products Hives and Rash  . Iodine Hives and Rash  . Tape Rash  :   Her Current Medications Are:  Outpatient Encounter Medications as of 06/06/2020  Medication Sig  . albuterol (VENTOLIN HFA) 108 (90 Base) MCG/ACT inhaler Inhale 2 puffs into the lungs every 6 (six) hours as needed for wheezing or shortness of breath.  Marland Kitchen  escitalopram (LEXAPRO) 10 MG tablet Take 1 tablet (10 mg total)  by mouth daily.  . Fremanezumab-vfrm (AJOVY) 225 MG/1.5ML SOAJ Inject 225 mg into the skin every 30 (thirty) days.  . ondansetron (ZOFRAN) 4 MG tablet Take 1 tablet (4 mg total) by mouth every 8 (eight) hours as needed for nausea or vomiting.  Marland Kitchen QUEtiapine (SEROQUEL) 100 MG tablet Take 1 tablet (100 mg total) by mouth at bedtime.  . rizatriptan (MAXALT) 10 MG tablet Take 1 tablet (10 mg total) by mouth as needed for migraine. May repeat in 2 hours if needed   No facility-administered encounter medications on file as of 06/06/2020.  :  Review of Systems:  Out of a complete 14 point review of systems, all are reviewed and negative with the exception of these symptoms as listed below: Review of Systems  Neurological:       Here for f/u on h/a and CPAP. Pt reports h/a have been the same since last visit. Reports she has not been using her CPAP consistently due lack of supplies. Pt reports supplies were just received last week and she plans to get back using her machine soon.      Objective:  Neurological Exam  Physical Exam Physical Examination:   Vitals:   06/06/20 0842  BP: 124/86  Pulse: 90  SpO2: 98%   General Examination: The patient is a very pleasant 30 y.o. female in no acute distress. She appears well-developed and well-nourished and well groomed.   HEENT:Normocephalic, atraumatic, pupils are equal, round and reactive to light, corrective eyeglasses in place.  Right eyelid is slightly droopy, appears stable, she states that she has a lazy eye.  She denies any blurry or double vision currently. Speech is clear without dysarthria, hypophonia or voice tremor.  Neck is supple, no carotid bruits.  Airway examination is stable, mild mouth dryness noted.    Chest:Clear to auscultation without wheezing, rhonchi or crackles noted.  Heart:S1+S2+0, regular and normal without murmurs, rubs or gallops noted.   Abdomen:Soft, non-tender and non-distended.  Extremities:There  isnopitting edema in the distal lower extremities bilaterally.   Skin: Warm and dry without trophic changes noted.   Musculoskeletal: exam reveals no obvious joint deformities, tenderness or joint swelling or erythema.   Neurologically:  Mental status: The patient is awake, alert and oriented in all 4 spheres.Herimmediate and remote memory, attention, language skills and fund of knowledge are appropriate. There is no evidence of aphasia, agnosia, apraxia or anomia. Speech is clear with normal prosody and enunciation. Thought process is linear. Mood is normaland affect is normal.  Cranial nerves II - XII are as described above under HEENT exam.  Motor exam: Normal bulk, strength and tone is noted. There is no tremor,fine motor skills and coordination: grossly intact.  Reflexes are 1+ throughout Cerebellar testing: No dysmetria or intention tremor. There is no truncal or gait ataxia.  Sensory exam: intact to light touch in the upper and lower extremities.  Gait, station and balance:Shestands easily. No veering to one side is noted. No leaning to one side is noted. Posture is age-appropriate and stance is narrow based. Gait showsnormalstride length and normalpace. No problems turning are noted. Romberg is negative, tandem walk unremarkable.  Assessmentand Plan:  In summary,Felicia Salinas a very pleasant 7 year oldfemalewith an underlying medical history of allergies, asthma, recurrent headaches and morbid obesity with a BMI of over 50, whopresents for  follow-up consultation of her severe obstructive sleep apnea and migraine headaches.  She was  started on Ajovy injections in December.  She has thus far had 3 injections and is willing to continue with it.  She tolerates that Ajovy.  She has not restarted her CPAP since our last visit in December 2021.  She had been waiting for supplies but recently received them.  She is encouraged to restart his CPAP as soon as possible, she  planted them last night but did not use it.  She is advised to follow-up in 3 months to see one of our nurse practitioners.  I think it is imperative that she treat her severe sleep apnea.  We talked about surgical options including inspire but she is favoring the positive airway pressure option at this time.  She is encouraged to continue to work on weight loss.  She is advised to follow-up as advised with her ophthalmologist but make a sooner appointment should she have any acute vision problems.  Neurological exam is stable.  I think for overall cardiovascular health, eye health and her headaches, she will benefit from treating her severe sleep apnea.  I reiterated this today. She had a home sleep test in January 2021 and was initially on AutoPap therapy but had trouble tolerating it.  She had a subsequent CPAP titration and a treatment pressure of 13 cm was determined as optimal.  She has trouble with the full facemask and feels that the CPAP makes her migraine worse.  She has a history of migraines and previously followed with Dr. Domingo Cocking.  She has tried and failed Topamax, baclofen and Zonegran through Dr. Domingo Cocking.  She also tried and failed trigger point injections.  She believes that her last injections were in or around August 2021.  She had a brain MRI earlier in 2021 which showed empty sella.  I referred her to ophthalmology subsequently. She has no new eye related symptoms.  I renewed her Ajovy prescription.  She has a Maxalt prescription through her primary care.  She is advised to follow-up in this clinic to see one of our nurse practitioners in 3 months, sooner if needed.  I answered all her questions today and she was in agreement.  I spent 20 minutes in total face-to-face time and in reviewing records during pre-charting, more than 50% of which was spent in counseling and coordination of care, reviewing test results, reviewing medications and treatment regimen and/or in discussing or reviewing  the diagnosis of OSA, migraines, the prognosis and treatment options. Pertinent laboratory and imaging test results that were available during this visit with the patient were reviewed by me and considered in my medical decision making (see chart for details).

## 2020-07-01 ENCOUNTER — Ambulatory Visit: Payer: No Typology Code available for payment source | Admitting: Allergy

## 2020-07-03 ENCOUNTER — Telehealth: Payer: No Typology Code available for payment source | Admitting: Emergency Medicine

## 2020-07-03 DIAGNOSIS — H9201 Otalgia, right ear: Secondary | ICD-10-CM

## 2020-07-03 NOTE — Progress Notes (Signed)
Hi Temima,  Happy birthday.  We are sorry you ar not feeling well today.   Based on what you shared with me, I feel your condition warrants further evaluation and I recommend that you be seen in a face to face office visit.  You need to have your ear examined in person to determine the specific cause of your pain- clogged with wax, infection of your ear drum or ear canal, infection of the skin or other cause, all of which would require different treatments.    NOTE: If you entered your credit card information for this eVisit, you will not be charged. You may see a "hold" on your card for the $35 but that hold will drop off and you will not have a charge processed.   If you are having a true medical emergency please call 911.      For an urgent face to face visit, [facility] has five urgent care centers for your convenience:     [facility] at [facility] Directions [phone] [address] [place], [place] [address] . 10 am - 6pm Monday - Friday    [facility]) Get Driving Directions [phone] [address] [place], [place] [address] . 10 am to 8 pm Monday-Friday . 12 pm to 8 pm [facility]) Get Driving Directions [phone]  [address] [place],  [place]  [address] . 8 am to 8 pm Monday-Friday . 8 am to 4 pm [facility]gent Care at [facility] Get Driving Directions [phone] [place], [place] [address] . 8 am to 8 pm Monday-Friday . 9 am to 6 pm Saturday . 11 am to 6 pm Sunday   [facility] at [facility] Get Driving Directions  [phone] [address] [place], [place] [address] . 8 am to 8 pm Monday-Friday . 8 am to 4 pm [facility] Urgent Care at [facility]  Directions [phone] [address] [place], [place] [address] . 12 pm to 6 pm Monday-Friday    VIDEO VISITS: [facility] is now providing on-demand video visits for your convenience. Speak to one of our providers from the comfort of your home 8 am to 8 pm or after hours through our partner vendor.   For Video Visit details and options:   [email or web address]      Your MyChart E-visit questionnaire answers were reviewed by a board certified advanced clinical practitioner to complete your personal care plan based on your specific symptoms.  Thank you for using e-Visits.   Approximately 5 minutes was spent documenting and reviewing patient's chart.

## 2020-07-04 ENCOUNTER — Ambulatory Visit: Payer: No Typology Code available for payment source | Admitting: Allergy

## 2020-07-04 ENCOUNTER — Encounter: Payer: Self-pay | Admitting: Allergy

## 2020-07-04 ENCOUNTER — Other Ambulatory Visit: Payer: Self-pay

## 2020-07-04 ENCOUNTER — Other Ambulatory Visit (HOSPITAL_COMMUNITY): Payer: Self-pay

## 2020-07-04 VITALS — BP 122/72 | HR 105 | Temp 97.9°F | Resp 18 | Ht 64.0 in | Wt 298.0 lb

## 2020-07-04 DIAGNOSIS — L509 Urticaria, unspecified: Secondary | ICD-10-CM | POA: Insufficient documentation

## 2020-07-04 DIAGNOSIS — E739 Lactose intolerance, unspecified: Secondary | ICD-10-CM

## 2020-07-04 DIAGNOSIS — T781XXD Other adverse food reactions, not elsewhere classified, subsequent encounter: Secondary | ICD-10-CM | POA: Diagnosis not present

## 2020-07-04 DIAGNOSIS — J4520 Mild intermittent asthma, uncomplicated: Secondary | ICD-10-CM

## 2020-07-04 DIAGNOSIS — J3089 Other allergic rhinitis: Secondary | ICD-10-CM | POA: Insufficient documentation

## 2020-07-04 MED ORDER — LEVOCETIRIZINE DIHYDROCHLORIDE 5 MG PO TABS
5.0000 mg | ORAL_TABLET | Freq: Every evening | ORAL | 5 refills | Status: DC
  Filled 2020-07-04: qty 30, 30d supply, fill #0

## 2020-07-04 MED ORDER — MONTELUKAST SODIUM 10 MG PO TABS
10.0000 mg | ORAL_TABLET | Freq: Every day | ORAL | 5 refills | Status: DC
  Filled 2020-07-04: qty 30, 30d supply, fill #0

## 2020-07-04 MED ORDER — FLUTICASONE PROPIONATE 50 MCG/ACT NA SUSP
1.0000 | Freq: Two times a day (BID) | NASAL | 5 refills | Status: DC | PRN
  Filled 2020-07-04: qty 16, 30d supply, fill #0

## 2020-07-04 MED ORDER — ALBUTEROL SULFATE HFA 108 (90 BASE) MCG/ACT IN AERS
2.0000 | INHALATION_SPRAY | RESPIRATORY_TRACT | 2 refills | Status: DC | PRN
  Filled 2020-07-04: qty 18, 17d supply, fill #0

## 2020-07-04 NOTE — Progress Notes (Signed)
New Patient Note  RE: Felicia Salinas MRN: [id number] DOB: 1990-04-12 Date of Office Visit: 07/04/2020  Consult requested by: Just, Azalee Course, FNP Primary care provider: Grayce Sessions, NP  Chief Complaint: Allergy Testing (Pt states she has allergies and would like a allergy test )  History of Present Illness: I had the pleasure of seeing Felicia Salinas for initial evaluation at the [facility] on 07/04/2020. She is a 30 y.o. female, who is referred here by PCP for the evaluation of allergies. She is accompanied today by her spouse who provided/contributed to the history.   Rhinitis: She reports symptoms of hives, stuffy nose, rhinorrhea, sneezing, asthma symptoms. Symptoms have been going on for 7+ years. The symptoms are present all year around with worsening in spring and summer. Other triggers include exposure to none. Anosmia: no. Headache: has migraines. She has used Claritin, Flonase with some improvement in symptoms. Sinus infections: none. Previous work up includes: 2014 skin testing was positive to grass, weeds, trees and dust mites.  Previous ENT evaluation: not recently, T&A as a child. Previous sinus imaging: no. History of nasal polyps: no. Last eye exam: in January 2022. History of reflux: no.  Asthma: She reports symptoms of chest tightness, shortness of breath, coughing, wheezing, nocturnal awakenings for 25+ years. Current medications include albuterol prn which help. She tried the following inhalers: none. Main triggers are allergies, strong scents, exertion. In the last month, frequency of symptoms: 2-3x/week. Frequency of nocturnal symptoms: 0x/month. Frequency of SABA use: 2-3x/week. Interference with physical activity: no. Sleep is undisturbed. In the last 12 months, emergency room visits/urgent care visits/doctor office visits or hospitalizations due to respiratory issues: no. In the last 12 months, oral steroids courses: no. Lifetime history of  hospitalization for respiratory issues: no. Prior intubations: no. Asthma was diagnosed at age [age]. History of pneumonia: no. She was evaluated by allergist in the past. Smoking exposure: no. Up to date with flu vaccine: yes. Up to date with COVID-19 vaccine: yes. Prior Covid-19 infection: no.  Assessment and Plan: Felicia Salinas is a 30 y.o. female with: Other allergic rhinitis Perennial rhinitis symptoms for the last 7+ years with worsening the spring and summer.  Tried Claritin and Flonase with some benefit.  Skin testing in 2014 was positive to grass, weed, trees and dust mites.  No prior allergy immunotherapy.  Today's skin testing showed: Positive to grass, weed pollen. borderline to mold and dust mites.   Start environmental control measures as below.  May use over the counter antihistamines such as Xyzal (levocetirizine)  daily at night as needed.  May use Flonase (fluticasone) nasal spray 1 spray per nostril twice a day as needed for nasal congestion.  Start Singulair (montelukast)  daily at night if you notice the above medications are not controlling your symptoms.  Cautioned that in some children/adults can experience behavioral changes including hyperactivity, agitation, depression, sleep disturbances and suicidal ideations. These side effects are rare, but if you notice them you should notify me and discontinue Singulair (montelukast).  Consider allergy immunotherapy for long-term control in the future - handout given.   Asthma Diagnosed with asthma as a young child and currently using albuterol 2-3 times per week with good benefit.  Main triggers are allergies, strong scents and exertion.  Today's spirometry was normal.  May use albuterol rescue inhaler 2 puffs every 4 to 6 hours as needed for shortness of breath, chest tightness, coughing, and wheezing. May use albuterol rescue inhaler 2  puffs 5 to 15 minutes prior to strenuous physical activities. Monitor frequency  of use.   Start Singulair  daily as above.   Urticaria History of hives which are mainly triggered by heat and environmental allergies per patient report.  According to scanned records patient used to be on Xolair injections.  Monitor symptoms.   Adverse reaction to food, subsequent encounter Currently avoiding shellfish due to positive skin testing in the past.  Not sure of any known clinical reactions.  Tolerates conditioning without any issues.  She is also lactose intolerant.  Today's skin testing showed: Negative to shellfish.   Continue to avoid shellfish.  If interested in re-introductions will need to get bloodwork first - patient declines at this time.   Removed egg from allergy list as patient tolerates without any issues.   For mild symptoms you can take over the counter antihistamines such as Benadryl and monitor symptoms closely. If symptoms worsen or if you have severe symptoms including breathing issues, throat closure, significant swelling, whole body hives, severe diarrhea and vomiting, lightheadedness then seek immediate medical care.  Return in about 3 months (around 10/03/2020).  Meds ordered this encounter  Medications  . levocetirizine (XYZAL) 5 MG tablet    Sig: Take 1 tablet (5 mg total) by mouth every evening.    Dispense:  30 tablet    Refill:  5  . fluticasone (FLONASE) 50 MCG/ACT nasal spray    Sig: Place 1 spray into both nostrils 2 (two) times daily as needed for rhinitis.    Dispense:  16 g    Refill:  5  . montelukast (SINGULAIR) 10 MG tablet    Sig: Take 1 tablet (10 mg total) by mouth at bedtime.    Dispense:  30 tablet    Refill:  5  . albuterol (VENTOLIN HFA) 108 (90 Base) MCG/ACT inhaler    Sig: Inhale 2 puffs into the lungs every 4 (four) hours as needed for wheezing or shortness of breath (coughing).    Dispense:  18 g    Refill:  2   Lab Orders  No laboratory test(s) ordered today    Other allergy screening: Food allergy: yes   Currently avoiding shellfish due to positive skin testing in the past. Not sure of any clinical history.  Tolerates finned fish and eggs without any issues.  Patient is lactose intolerant.   Medication allergy: yes  Iodine - hives Hymenoptera allergy: large localized reactions Urticaria: yes  Breaks out in hives from heat and environmental allergies - usually takes about 45 minutes to resolve. No ecchymosis upon resolution Used to be on Xolair in the past.  Eczema:no History of recurrent infections suggestive of immunodeficency: no  Diagnostics: Spirometry:  Tracings reviewed. Her effort: Good reproducible efforts. FVC: 3.72L FEV1: 3.32L, 122% predicted FEV1/FVC ratio: 89% Interpretation: Spirometry consistent with normal pattern.  Please see scanned spirometry results for details.  Skin Testing: Environmental allergy panel and select foods. Positive to grass, weed pollen. borderline to mold and dust mites.  Negative to shellfish.  Results discussed with patient/family.  Airborne Adult Perc - 07/04/20 1550    Time Antigen Placed 1550    Allergen Manufacturer Waynette Buttery    Location Back    Number of Test 59    Panel 1 Select    1. Control-Buffer 50% Glycerol Negative    2. Control-Histamine 1 mg/ml 2+    3. Albumin saline Negative    4. Bahia Negative    5. [place] Negative  6. Johnson Negative    7. Kentucky Blue 3+    8. Meadow Fescue 2+    9. Perennial Rye Negative    10. Sweet Vernal Negative    11. Timothy 4+    12. Cocklebur Negative    13. Burweed Marshelder Negative    14. Ragweed, short Negative    15. Ragweed, Giant Negative    16. Plantain,  English Negative    17. Lamb's Quarters Negative    18. Sheep Sorrell Negative    19. Rough Pigweed Negative    20. Marsh Elder, Rough Negative    21. Mugwort, Common Negative    22. Ash mix Negative    23. Birch mix Negative    24. Beech American Negative    25. Box, Elder Negative    26. Cedar, red Negative     27. Cottonwood, Guinea-Bissau Negative    28. Elm mix Negative    29. Hickory Negative    30. Maple mix Negative    31. Oak, Guinea-Bissau mix Negative    32. Pecan Pollen Negative    33. Pine mix Negative    34. Sycamore Eastern Negative    35. Walnut, Black Pollen Negative    36. Alternaria alternata Negative    37. Cladosporium Herbarum Negative    38. Aspergillus mix Negative    39. Penicillium mix Negative    40. Bipolaris sorokiniana (Helminthosporium) Negative    41. Drechslera spicifera (Curvularia) Negative    42. Mucor plumbeus Negative    43. Fusarium moniliforme Negative    44. Aureobasidium pullulans (pullulara) Negative    45. Rhizopus oryzae Negative    46. Botrytis cinera Negative    47. Epicoccum nigrum Negative    48. Phoma betae Negative    49. Candida Albicans Negative    50. Trichophyton mentagrophytes Negative    51. Mite, D Farinae  5,000 AU/ml Negative    52. Mite, D Pteronyssinus  5,000 AU/ml Negative    53. Cat Hair 10,000 BAU/ml Negative    54.  Dog Epithelia Negative    55. Mixed Feathers Negative    56. Horse Epithelia Negative    57. Cockroach, German Negative    58. Mouse Negative    59. Tobacco Leaf Negative    Other Omitted    Other Omitted          Intradermal - 07/04/20 1533    Time Antigen Placed 1533    Allergen Manufacturer Waynette Buttery    Location Arm    Number of Test 14    Control Negative    [place] 2+    Johnson Negative    Ragweed mix Negative    Weed mix 2+    Tree mix Negative    Mold 1 Negative    Mold 2 Negative    Mold 3 --   +/-   Mold 4 Negative    Cat Negative    Dog Negative    Cockroach Negative    Mite mix --   +/-         Food Adult Perc - 07/04/20 1600    Time Antigen Placed 1605    Allergen Manufacturer Greer    Location Back    Number of allergen test 7    Control-Histamine 1 mg/ml 2+    8. Shellfish Mix Negative    25. Shrimp Negative    26. Crab Negative    27. Lobster Negative    28. Oyster Negative  29. Scallops Negative           Past Medical History: Patient Active Problem List   Diagnosis Date Noted  . Other allergic rhinitis 07/04/2020  . Lactose intolerance 07/04/2020  . Adverse reaction to food, subsequent encounter 07/04/2020  . Urticaria 07/04/2020  . Hirsutism 05/06/2020  . Infertility, female 05/06/2020  . Empty sella turcica (HCC) 05/06/2020  . Migraine 01/27/2020  . Chronic migraine without aura, with intractable migraine, so stated, with status migrainosus 10/16/2019  . OSA (obstructive sleep apnea) 10/16/2019  . Insomnia 10/16/2019  . Asthma 09/29/2018  . Polycystic ovary syndrome 09/29/2018  . Obesity (BMI 30-39.9) 04/11/2011   Past Medical History:  Diagnosis Date  . Angio-edema   . Anxiety   . Asthma    controlled per patient last attack in January  . Environmental allergies   . Obesity   . Urticaria    Past Surgical History: Past Surgical History:  Procedure Laterality Date  . APPENDECTOMY  04/07/2011  . CHOLECYSTECTOMY  10/18/2011   Procedure: LAPAROSCOPIC CHOLECYSTECTOMY WITH INTRAOPERATIVE CHOLANGIOGRAM;  Surgeon: Emelia Loron, MD;  Location: [facility] OR;  Service: General;  Laterality: N/A;  . IR ANGIO INTRA EXTRACRAN SEL COM CAROTID INNOMINATE UNI R MOD SED  06/02/2019  . IR ANGIO INTRA EXTRACRAN SEL INTERNAL CAROTID UNI L MOD SED  06/02/2019  . IR ANGIO VERTEBRAL SEL VERTEBRAL UNI R MOD SED  06/02/2019  . IR US GUIDE VASC ACCESS RIGHT  06/02/2019  . TONSILLECTOMY    . WISDOM TOOTH EXTRACTION     Medication List:  Current Outpatient Medications  Medication Sig Dispense Refill  . albuterol (VENTOLIN HFA) 108 (90 Base) MCG/ACT inhaler Inhale 2 puffs into the lungs every 4 (four) hours as needed for wheezing or shortness of breath (coughing). 18 g 2  . escitalopram (LEXAPRO) 10 MG tablet Take 1 tablet (10 mg total) by mouth daily. 90 tablet 3  . fluticasone (FLONASE) 50 MCG/ACT nasal spray Place 1 spray into both nostrils 2 (two) times daily as needed  for rhinitis. 16 g 5  . Fremanezumab-vfrm (AJOVY) 225 MG/1.5ML SOAJ Inject 225 mg into the skin every 30 (thirty) days. 1.5 mL 5  . levocetirizine (XYZAL) 5 MG tablet Take 1 tablet (5 mg total) by mouth every evening. 30 tablet 5  . montelukast (SINGULAIR) 10 MG tablet Take 1 tablet (10 mg total) by mouth at bedtime. 30 tablet 5  . QUEtiapine (SEROQUEL) 100 MG tablet Take 1 tablet (100 mg total) by mouth at bedtime. 30 tablet 5  . rizatriptan (MAXALT) 10 MG tablet Take 1 tablet (10 mg total) by mouth as needed for migraine. May repeat in 2 hours if needed 40 tablet 3  . baclofen (LIORESAL) 20 MG tablet Take by mouth.    . ondansetron (ZOFRAN ODT) 4 MG disintegrating tablet Take 1 tablet (4 mg total) by mouth every 8 (eight) hours as needed for nausea or vomiting. (Patient not taking: Reported on 07/04/2020) 20 tablet 0  . zonisamide (ZONEGRAN) 25 MG capsule Take by mouth.     No current facility-administered medications for this visit.   Allergies: Allergies  Allergen Reactions  . Dairy Aid [Lactase] Anaphylaxis  . Other Hives and Anaphylaxis     shrimp, dairy products, shellfish (Swelling) Other reaction(s): Hives  . Shellfish Allergy Anaphylaxis  . Benadryl [Diphenhydramine Hcl] Hives  . Iodine Hives and Rash  . Tape Rash   Social History: Social History   Socioeconomic History  . Marital status: Married  Spouse name: Not on file  . Number of children: 0  . Years of education: Not on file  . Highest education level: Not on file  Occupational History  . Occupation: Cabin crewrepresentative    Employer: [employer]  Tobacco Use  . Smoking status: Never Smoker  . Smokeless tobacco: Never Used  Vaping Use  . Vaping Use: Never used  Substance and Sexual Activity  . Alcohol use: Not Currently    Comment: Occasional  . Drug use: No  . Sexual activity: Yes    Birth control/protection: None  Other Topics Concern  . Not on file  Social History Narrative  . Not on file   Social Determinants  of Health   Financial Resource Strain: Not on file  Food Insecurity: Not on file  Transportation Needs: Not on file  Physical Activity: Not on file  Stress: Not on file  Social Connections: Not on file   Lives in an apartment. Smoking: denies Occupation: Cytogeneticistfront desk  Environmental HistorySurveyor, minerals: Water Damage/mildew in the house: no Engineer, civil (consulting)Carpet in the family room: yes Carpet in the bedroom: yes Heating: gas Cooling: central Pet: yes 1 cat x 9 months  Family History: Family History  Problem Relation Age of Onset  . Cancer Mother        breast and spinal CA  . Asthma Mother   . Healthy Father   . Asthma Father   . Breast cancer Maternal Grandmother   . Diabetes Maternal Grandmother   . Rheum arthritis Maternal Grandmother   . Ovarian cancer Paternal Aunt   . Ovarian cancer Maternal Aunt   . Diabetes Sister   . Diabetes Paternal Grandfather   . Hypertension Maternal Grandfather   . Colon cancer Neg Hx    Review of Systems  Constitutional: Negative for appetite change, chills, fever and unexpected weight change.  HENT: Positive for congestion, postnasal drip, rhinorrhea and sneezing.   Eyes: Negative for itching.  Respiratory: Negative for cough, chest tightness, shortness of breath and wheezing.   Cardiovascular: Negative for chest pain.  Gastrointestinal: Negative for abdominal pain.  Genitourinary: Negative for difficulty urinating.  Skin: Negative for rash.  Allergic/Immunologic: Positive for environmental allergies.  Neurological: Positive for headaches.   Objective: BP 122/72 (BP Location: Right Wrist, Patient Position: Sitting, Cuff Size: Normal)   Pulse (!) 105   Temp 97.9 F (36.6 C) (Temporal)   Resp 18   Ht 5\' 4"  (1.626 m)   Wt 298 lb (135.2 kg)   SpO2 98%   BMI 51.15 kg/m  Body mass index is 51.15 kg/m. Physical Exam Vitals and nursing note reviewed.  Constitutional:      Appearance: Normal appearance. She is well-developed. She is obese.  HENT:      Head: Normocephalic and atraumatic.     Right Ear: Tympanic membrane and external ear normal.     Left Ear: Tympanic membrane and external ear normal.     Nose: Nose normal.     Mouth/Throat:     Mouth: Mucous membranes are moist.     Pharynx: Oropharynx is clear.  Eyes:     Conjunctiva/sclera: Conjunctivae normal.  Cardiovascular:     Rate and Rhythm: Normal rate and regular rhythm.     Heart sounds: Normal heart sounds. No murmur heard. No friction rub. No gallop.   Pulmonary:     Effort: Pulmonary effort is normal.     Breath sounds: Normal breath sounds. No wheezing, rhonchi or rales.  Musculoskeletal:     Cervical  back: Neck supple.  Skin:    General: Skin is warm.     Findings: No rash.  Neurological:     Mental Status: She is alert and oriented to person, place, and time.  Psychiatric:        Behavior: Behavior normal.    The plan was reviewed with the patient/family, and all questions/concerned were addressed.  It was my pleasure to see Elysia today and participate in her care. Please feel free to contact me with any questions or concerns.  Sincerely,  Wyline Mood, DO Allergy & Immunology  [facility] office: [phone] [facility] office: [phone]

## 2020-07-04 NOTE — Assessment & Plan Note (Signed)
Diagnosed with asthma as a young child and currently using albuterol 2-3 times per week with good benefit.  Main triggers are allergies, strong scents and exertion.  Today's spirometry was normal.  May use albuterol rescue inhaler 2 puffs every 4 to 6 hours as needed for shortness of breath, chest tightness, coughing, and wheezing. May use albuterol rescue inhaler 2 puffs 5 to 15 minutes prior to strenuous physical activities. Monitor frequency of use.   Start Singulair 10mg  daily as above.

## 2020-07-04 NOTE — Assessment & Plan Note (Addendum)
Currently avoiding shellfish due to positive skin testing in the past.  Not sure of any known clinical reactions.  Tolerates conditioning without any issues.  She is also lactose intolerant.  Today's skin testing showed: Negative to shellfish.   Continue to avoid shellfish.  If interested in re-introductions will need to get bloodwork first - patient declines at this time.   Removed egg from allergy list as patient tolerates without any issues.   For mild symptoms you can take over the counter antihistamines such as Benadryl and monitor symptoms closely. If symptoms worsen or if you have severe symptoms including breathing issues, throat closure, significant swelling, whole body hives, severe diarrhea and vomiting, lightheadedness then seek immediate medical care.

## 2020-07-04 NOTE — Assessment & Plan Note (Signed)
History of hives which are mainly triggered by heat and environmental allergies per patient report.  According to scanned records patient used to be on Xolair injections.  Monitor symptoms.

## 2020-07-04 NOTE — Patient Instructions (Addendum)
Today's skin testing showed: Positive to grass, weed pollen. borderline to mold and dust mites.  Negative to shellfish.   Environmental allergies  Start environmental control measures as below.  May use over the counter antihistamines such as Xyzal (levocetirizine) 5mg  daily at night as needed.  May use Flonase (fluticasone) nasal spray 1 spray per nostril twice a day as needed for nasal congestion.  Start Singulair (montelukast) 10mg  daily at night if you notice the above medications are not controlling your symptoms. Good for asthma too.  Cautioned that in some children/adults can experience behavioral changes including hyperactivity, agitation, depression, sleep disturbances and suicidal ideations. These side effects are rare, but if you notice them you should notify me and discontinue Singulair (montelukast).  Asthma:  Your breathing test was normal today.  May use albuterol rescue inhaler 2 puffs every 4 to 6 hours as needed for shortness of breath, chest tightness, coughing, and wheezing. May use albuterol rescue inhaler 2 puffs 5 to 15 minutes prior to strenuous physical activities. Monitor frequency of use.   Rash:  See below for proper skin care.   Food:  Continue to avoid shellfish.  If you are interested in re-introductions we will need to get bloodwork first.  For mild symptoms you can take over the counter antihistamines such as Benadryl and monitor symptoms closely. If symptoms worsen or if you have severe symptoms including breathing issues, throat closure, significant swelling, whole body hives, severe diarrhea and vomiting, lightheadedness then seek immediate medical care.  Follow up in 3 months or sooner if needed.   Skin care recommendations  Bath time: . Always use lukewarm water. AVOID very hot or cold water. Keep bathing time to 5-10 minutes. . Do NOT use bubble bath. . Use a mild soap and use just enough to wash the dirty areas. . Do NOT scrub skin  vigorously.  . After bathing, pat dry your skin with a towel. Do NOT rub or scrub the skin.  Moisturizers and prescriptions:  . ALWAYS apply moisturizers immediately after bathing (within 3 minutes). This helps to lock-in moisture. . Use the moisturizer several times a day over the whole body. summer moisturizers include: Aveeno, CeraVe, Cetaphil. Marland Kitchen winter moisturizers include: Aquaphor, Vaseline, Cerave, Cetaphil, Eucerin, Vanicream. . When using moisturizers along with medications, the moisturizer should be applied about one hour after applying the medication to prevent diluting effect of the medication or moisturize around where you applied the medications. When not using medications, the moisturizer can be continued twice daily as maintenance.  Laundry and clothing: . Avoid laundry products with added color or perfumes. . Use unscented hypo-allergenic laundry products such as Tide free, Cheer free & gentle, and All free and clear.  . If the skin still seems dry or sensitive, you can try double-rinsing the clothes. . Avoid tight or scratchy clothing such as wool. . Do not use fabric softeners or dyer sheets.  Reducing Pollen Exposure . Pollen seasons: trees (spring), grass (summer) and ragweed/weeds (fall). 03-31-1984 Keep windows closed in your home and car to lower pollen exposure.  08-02[birth year]1980 air conditioning in the bedroom and throughout the house if possible.  . Avoid going out in dry windy days - especially early morning. . Pollen counts are highest between 5 - 10 AM and on dry, hot and windy days.  . Save outside activities for late afternoon or after a heavy rain, when pollen levels are lower.  . Avoid mowing of grass if you  have grass pollen allergy. Marland Kitchen Be aware that pollen can also be transported indoors on people and pets.  . Dry your clothes in an automatic dryer rather than hanging them outside where they might collect pollen.  . Rinse hair and eyes before bedtime. Mold  Control . Mold and fungi can grow on a variety of surfaces provided certain temperature and moisture conditions exist.  . Outdoor molds grow on plants, decaying vegetation and soil. The major outdoor mold, Alternaria and Cladosporium, are found in very high numbers during hot and dry conditions. Generally, a late summer - fall peak is seen for common outdoor fungal spores. Rain will temporarily lower outdoor mold spore count, but counts rise rapidly when the rainy period ends. . The most important indoor molds are Aspergillus and Penicillium. Dark, humid and poorly ventilated basements are ideal sites for mold growth. The next most common sites of mold growth are the bathroom and the kitchen. Outdoor (Seasonal) Mold Control . Use air conditioning and keep windows closed. . Avoid exposure to decaying vegetation. Marland Kitchen Avoid leaf raking. . Avoid grain handling. . Consider wearing a face mask if working in moldy areas.  Indoor (Perennial) Mold Control  . Maintain humidity below 50%. . Get rid of mold growth on hard surfaces with water, detergent and, if necessary, 5% bleach (do not mix with other cleaners). Then dry the area completely. If mold covers an area more than 10 square feet, consider hiring an indoor environmental professional. . For clothing, washing with soap and water is best. If moldy items cannot be cleaned and dried, throw them away. . Remove sources e.g. contaminated carpets. . Repair and seal leaking roofs or pipes. Using dehumidifiers in damp basements may be helpful, but empty the water and clean units regularly to prevent mildew from forming. All rooms, especially basements, bathrooms and kitchens, require ventilation and cleaning to deter mold and mildew growth. Avoid carpeting on concrete or damp floors, and storing items in damp areas. Control of House Dust Mite Allergen . Dust mite allergens are a common trigger of allergy and asthma symptoms. While they can be found throughout the  house, these microscopic creatures thrive in warm, humid environments such as bedding, upholstered furniture and carpeting. . Because so much time is spent in the bedroom, it is essential to reduce mite levels there.  . Encase pillows, mattresses, and box springs in special allergen-proof fabric covers or airtight, zippered plastic covers.  . Bedding should be washed weekly in hot water (130 F) and dried in a hot dryer. Allergen-proof covers are available for comforters and pillows that can't be regularly washed.  Reyes Ivan the allergy-proof covers every few months. Minimize clutter in the bedroom. Keep pets out of the bedroom.  Marland Kitchen Keep humidity less than 50% by using a dehumidifier or air conditioning. You can buy a humidity measuring device called a hygrometer to monitor this.  . If possible, replace carpets with hardwood, linoleum, or washable area rugs. If that's not possible, vacuum frequently with a vacuum that has a HEPA filter. . Remove all upholstered furniture and non-washable window drapes from the bedroom. . Remove all non-washable stuffed toys from the bedroom.  Wash stuffed toys weekly.

## 2020-07-04 NOTE — Assessment & Plan Note (Signed)
Perennial rhinitis symptoms for the last 7+ years with worsening the spring and summer.  Tried Claritin and Flonase with some benefit.  Skin testing in 2014 was positive to grass, weed, trees and dust mites.  No prior allergy immunotherapy.  Today's skin testing showed: Positive to grass, weed pollen. borderline to mold and dust mites.   Start environmental control measures as below.  May use over the counter antihistamines such as Xyzal (levocetirizine) 5mg  daily at night as needed.  May use Flonase (fluticasone) nasal spray 1 spray per nostril twice a day as needed for nasal congestion.  Start Singulair (montelukast) 10mg  daily at night if you notice the above medications are not controlling your symptoms.  Cautioned that in some children/adults can experience behavioral changes including hyperactivity, agitation, depression, sleep disturbances and suicidal ideations. These side effects are rare, but if you notice them you should notify me and discontinue Singulair (montelukast).  Consider allergy immunotherapy for long-term control in the future - handout given.

## 2020-07-05 ENCOUNTER — Other Ambulatory Visit: Payer: Self-pay | Admitting: Neurology

## 2020-07-05 ENCOUNTER — Other Ambulatory Visit (HOSPITAL_COMMUNITY): Payer: Self-pay

## 2020-07-05 ENCOUNTER — Encounter: Payer: No Typology Code available for payment source | Admitting: Family Medicine

## 2020-07-05 DIAGNOSIS — G43019 Migraine without aura, intractable, without status migrainosus: Secondary | ICD-10-CM

## 2020-07-06 MED ORDER — AJOVY 225 MG/1.5ML ~~LOC~~ SOAJ
225.0000 mg | SUBCUTANEOUS | 5 refills | Status: DC
  Filled 2020-07-06: qty 1.5, 30d supply, fill #0
  Filled 2020-07-18: qty 1.5, 28d supply, fill #0

## 2020-07-07 ENCOUNTER — Other Ambulatory Visit (HOSPITAL_COMMUNITY): Payer: Self-pay

## 2020-07-11 NOTE — Telephone Encounter (Signed)
PA for Ajvoy has been sent via cover my meds. (Key: [id number]) PA had to be faxed through cover my meds.

## 2020-07-18 ENCOUNTER — Other Ambulatory Visit (HOSPITAL_COMMUNITY): Payer: Self-pay

## 2020-07-18 MED FILL — Fremanezumab-vfrm Subcutaneous Soln Auto-inj 225 MG/1.5ML: SUBCUTANEOUS | 28 days supply | Qty: 1.5 | Fill #0 | Status: CN

## 2020-07-20 ENCOUNTER — Telehealth: Payer: Self-pay | Admitting: Neurology

## 2020-07-20 ENCOUNTER — Telehealth: Payer: Self-pay

## 2020-07-20 ENCOUNTER — Other Ambulatory Visit (HOSPITAL_COMMUNITY): Payer: Self-pay

## 2020-07-20 MED ORDER — EMGALITY 120 MG/ML ~~LOC~~ SOAJ
SUBCUTANEOUS | 3 refills | Status: DC
  Filled 2020-07-20: qty 1, 30d supply, fill #0
  Filled 2020-07-21: qty 1, 28d supply, fill #0
  Filled 2020-07-27: qty 1, 30d supply, fill #0
  Filled 2020-09-02: qty 1, 30d supply, fill #1

## 2020-07-20 NOTE — Telephone Encounter (Signed)
See mychart message from 07/20/20.

## 2020-07-20 NOTE — Telephone Encounter (Signed)
Patient is agreeable, please enter prescription for Emgality monthly injections and start prior authorization for Emgality.

## 2020-07-20 NOTE — Telephone Encounter (Signed)
I have sent the pt a my chart message asking about starting emgality.  Waiting on response.

## 2020-07-20 NOTE — Telephone Encounter (Signed)
Received PA for Felicia Salinas pt was previously getting medication through med assistance, but she is no longer eligible for this. Pt was advised by pharmacy Emgality, Nurtect, Aimovig and Bennie Pierini are on the formulary for the 2022 year, but all will require a PA.  Will forward to MD to review and advise on.

## 2020-07-20 NOTE — Telephone Encounter (Signed)
Pt called stating that the Fremanezumab-vfrm (AJOVY) 225 MG/1.5ML SOAJ has been denied and she is wanting to know if something else can be called in for her because she has been having a headache for the last two weeks. Please advise.

## 2020-07-21 ENCOUNTER — Other Ambulatory Visit (HOSPITAL_COMMUNITY): Payer: Self-pay

## 2020-07-27 ENCOUNTER — Other Ambulatory Visit (HOSPITAL_COMMUNITY): Payer: Self-pay

## 2020-07-27 MED ORDER — UBRELVY 100 MG PO TABS
100.0000 mg | ORAL_TABLET | ORAL | 3 refills | Status: DC | PRN
  Filled 2020-07-27: qty 10, 30d supply, fill #0
  Filled 2020-08-19 – 2020-09-03 (×2): qty 10, 30d supply, fill #1

## 2020-07-27 NOTE — Addendum Note (Signed)
Addended by: Huston Foley on: 07/27/2020 04:12 PM   Modules accepted: Orders

## 2020-07-27 NOTE — Telephone Encounter (Signed)
See my chart message from 07/27/20.

## 2020-07-27 NOTE — Telephone Encounter (Addendum)
I called patient, and discussed this message.  Patient's Emgality is awaiting authorization through the insurance.  Prior authorization was submitted on 07/27/20.  Patient has not been using CPAP due to incorrect tubing, for at least the last month.  Patient states she has a severe migraine that has been lingering for several days and the rizatriptan is not helping.  Patient wanted to know if the doctor could call in an abortive medication to help break up this migraine.  While she is waiting for her Emgality to be approved and waiting to meet with [employer] on 08/02/2020 to correct the tubing issue.  I advised I would talk with Dr. Frances Furbish about possible alternatives and get back with her via MyChart patient was agreeable to this plan.

## 2020-07-27 NOTE — Telephone Encounter (Signed)
I have Rx Ubrelvy 100 mg. Take 1 pill at onset of migraine headache, may repeat in 2 hours, no more than 4 pills in 24 hours, i.e. not to exceed 200 mg per 24 hours. May cause sedation and nausea.   May not be taken at the same time as Maxalt.  She can take Maxalt or Ubrelvy, not both together.  Please call patient back to advise her of this, or email through [employer]

## 2020-07-27 NOTE — Telephone Encounter (Signed)
Pt called wanting to know if something can be given to her while she waits for this authorization to got through. Pt states she is having a bad migraine and is needing some relief. Please advise.

## 2020-07-28 ENCOUNTER — Other Ambulatory Visit (HOSPITAL_COMMUNITY): Payer: Self-pay

## 2020-07-29 ENCOUNTER — Other Ambulatory Visit (HOSPITAL_COMMUNITY): Payer: Self-pay

## 2020-08-01 ENCOUNTER — Other Ambulatory Visit (HOSPITAL_COMMUNITY): Payer: Self-pay

## 2020-08-01 ENCOUNTER — Telehealth: Payer: Self-pay

## 2020-08-01 NOTE — Telephone Encounter (Signed)
PA for Emgality has been approved through [employer]. PA ref # [id number] Approval dates are 07/31/20-08/30/20 for loading does of two pens and further approval is 08/23/20-01/22/21.  Pt has been notified of approval dates via mychart message.

## 2020-08-02 ENCOUNTER — Other Ambulatory Visit (HOSPITAL_COMMUNITY): Payer: Self-pay

## 2020-08-08 ENCOUNTER — Encounter: Payer: Self-pay | Admitting: Physician Assistant

## 2020-08-08 ENCOUNTER — Telehealth: Payer: No Typology Code available for payment source | Admitting: Physician Assistant

## 2020-08-08 ENCOUNTER — Other Ambulatory Visit (HOSPITAL_COMMUNITY): Payer: Self-pay

## 2020-08-08 DIAGNOSIS — G43009 Migraine without aura, not intractable, without status migrainosus: Secondary | ICD-10-CM | POA: Diagnosis not present

## 2020-08-08 MED ORDER — METHYLPREDNISOLONE 4 MG PO TBPK
ORAL_TABLET | ORAL | 0 refills | Status: DC
  Filled 2020-08-08: qty 21, 6d supply, fill #0

## 2020-08-08 MED ORDER — TIZANIDINE HCL 4 MG PO TABS
4.0000 mg | ORAL_TABLET | Freq: Four times a day (QID) | ORAL | 0 refills | Status: DC | PRN
  Filled 2020-08-08: qty 15, 4d supply, fill #0

## 2020-08-08 NOTE — Patient Instructions (Signed)
Instructions sent to patients MyChart.

## 2020-08-08 NOTE — Progress Notes (Signed)
Ms. Felicia Salinas, Felicia Salinas are scheduled for a virtual visit with your provider today.    Just as we do with appointments in the office, we must obtain your consent to participate.  Your consent will be active for this visit and any virtual visit you may have with one of our providers in the next 365 days.    If you have a MyChart account, I can also send a copy of this consent to you electronically.  All virtual visits are billed to your insurance company just like a traditional visit in the office.  As this is a virtual visit, video technology does not allow for your provider to perform a traditional examination.  This may limit your provider's ability to fully assess your condition.  If your provider identifies any concerns that need to be evaluated in person or the need to arrange testing such as labs, EKG, etc, we will make arrangements to do so.    Although advances in technology are sophisticated, we cannot ensure that it will always work on either your end or our end.  If the connection with a video visit is poor, we may have to switch to a telephone visit.  With either a video or telephone visit, we are not always able to ensure that we have a secure connection.   I need to obtain your verbal consent now.   Are you willing to proceed with your visit today?   Felicia Salinas has provided verbal consent on 08/08/2020 for a virtual visit (video or telephone).  Piedad Climes, PA-C 08/08/2020  11:44 AM  Virtual Visit via Video   I connected with patient on 08/08/20 at 11:45 AM EDT by a video enabled telemedicine application and verified that I am speaking with the correct person using two identifiers.  Location patient: Home Location provider: [facility] Persons participating in the virtual visit: Patient, Provider  I discussed the limitations of evaluation and management by telemedicine and the availability of in person appointments. The patient expressed understanding and agreed to  proceed.  Subjective:   HPI:  Patient presents via [facility] today c/o recent flare up of her migraines. Notes over the past 2 weeks having multiple migraines per week. She recently had to switch from Ajovy to [place] with first dose on 08/03/2020 with her neurologist. Has Rx for Ubrelvy for abortive therapy. She states this works but she has had to take 1-2 every other day over the past 2 weeks. Denies any change to stress, diet, activity level, etc. Denies any other recent medication changes. Denies sinus pressure or other URI symptoms. Has been resting well. Current migraine is bilateral with some photophobia but no aura. Also some associated posterior neck tension which she notes she does get from time to time. Denies any vision changes or confusion. .   ROS:   See pertinent positives and negatives per HPI.  Patient Active Problem List   Diagnosis Date Noted  . Other allergic rhinitis 07/04/2020  . Lactose intolerance 07/04/2020  . Adverse reaction to food, subsequent encounter 07/04/2020  . Urticaria 07/04/2020  . Hirsutism 05/06/2020  . Infertility, female 05/06/2020  . Empty sella turcica (HCC) 05/06/2020  . Migraine 01/27/2020  . Chronic migraine without aura, with intractable migraine, so stated, with status migrainosus 10/16/2019  . OSA (obstructive sleep apnea) 10/16/2019  . Insomnia 10/16/2019  . Asthma 09/29/2018  . Polycystic ovary syndrome 09/29/2018  . Obesity (BMI 30-39.9) 04/11/2011    Social History   Tobacco  Use  . Smoking status: Never Smoker  . Smokeless tobacco: Never Used  Substance Use Topics  . Alcohol use: Not Currently    Comment: Occasional    Current Outpatient Medications:  .  albuterol (VENTOLIN HFA) 108 (90 Base) MCG/ACT inhaler, INHALE 2 PUFFS INTO THE LUNGS EVERY 6 HOURS AS NEEDED FOR WHEEZING OR SHORTNESS OF BREATH., Disp: 18 g, Rfl: 2 .  albuterol (VENTOLIN HFA) 108 (90 Base) MCG/ACT inhaler, Inhale 2 puffs into the lungs every 4 (four)  hours as needed for wheezing or shortness of breath (coughing)., Disp: 18 g, Rfl: 2 .  baclofen (LIORESAL) 20 MG tablet, Take by mouth., Disp: , Rfl:  .  escitalopram (LEXAPRO) 10 MG tablet, Take 1 tablet (10 mg total) by mouth daily., Disp: 90 tablet, Rfl: 3 .  escitalopram (LEXAPRO) 10 MG tablet, TAKE 1 TABLET BY MOUTH ONCE A DAY, Disp: 90 tablet, Rfl: 3 .  fluticasone (FLONASE) 50 MCG/ACT nasal spray, Place 1 spray into both nostrils 2 (two) times daily as needed for rhinitis., Disp: 16 g, Rfl: 5 .  Fremanezumab-vfrm (AJOVY) 225 MG/1.5ML SOAJ, Inject 225 mg into the skin every 30 (thirty) days., Disp: 1.5 mL, Rfl: 5 .  Fremanezumab-vfrm 225 MG/1.5ML SOAJ, INJECT 225 MG INTO THE SKIN EVERY 30 DAYS., Disp: 1.5 mL, Rfl: 5 .  Fremanezumab-vfrm 225 MG/1.5ML SOAJ, INJECT 225 MG INTO THE SKIN EVERY 30 DAYS., Disp: 1.5 mL, Rfl: 3 .  Galcanezumab-gnlm (EMGALITY) 120 MG/ML SOAJ, Inject 1 pen every 30 days as directed., Disp: 1 mL, Rfl: 3 .  levocetirizine (XYZAL) 5 MG tablet, Take 1 tablet (5 mg total) by mouth every evening., Disp: 30 tablet, Rfl: 5 .  montelukast (SINGULAIR) 10 MG tablet, Take 1 tablet (10 mg total) by mouth at bedtime., Disp: 30 tablet, Rfl: 5 .  ondansetron (ZOFRAN ODT) 4 MG disintegrating tablet, Take 1 tablet (4 mg total) by mouth every 8 (eight) hours as needed for nausea or vomiting. (Patient not taking: Reported on 07/04/2020), Disp: 20 tablet, Rfl: 0 .  ondansetron (ZOFRAN) 4 MG tablet, TAKE 1 TABLET (4 MG TOTAL) BY MOUTH EVERY 8 (EIGHT) HOURS AS NEEDED FOR NAUSEA OR VOMITING., Disp: 20 tablet, Rfl: 0 .  ondansetron (ZOFRAN-ODT) 4 MG disintegrating tablet, DISSOLVE 1 TABLET BY MOUTH EVERY 8 HOURS AS NEEDED FOR NAUSEA OR VOMITING, Disp: 20 tablet, Rfl: 0 .  QUEtiapine (SEROQUEL) 100 MG tablet, Take 1 tablet (100 mg total) by mouth at bedtime., Disp: 30 tablet, Rfl: 5 .  QUEtiapine (SEROQUEL) 100 MG tablet, TAKE 1 TABLET BY MOUTH ONCE A DAY AT BEDTIME, Disp: 30 tablet, Rfl: 5 .   rizatriptan (MAXALT) 10 MG tablet, Take 1 tablet (10 mg total) by mouth as needed for migraine. May repeat in 2 hours if needed, Disp: 40 tablet, Rfl: 3 .  rizatriptan (MAXALT) 10 MG tablet, TAKE 1 TABLET BY MOUTH AS NEEDED FOR MIGRAINE. MAY REPEAT IN 2 HOURS IF NEEDED, Disp: 40 tablet, Rfl: 3 .  rizatriptan (MAXALT) 10 MG tablet, TAKE 1 TABLET (10 MG TOTAL) BY MOUTH AS NEEDED FOR MIGRAINE. MAY REPEAT IN 2 HOURS IF NEEDED, Disp: 40 tablet, Rfl: 3 .  rizatriptan (MAXALT) 5 MG tablet, TAKE 1 TABLET BY MOUTH AS NEEDED FOR MIGRAINE. MAY REPEAT ONCE IN 2 HOURS IF NEEDED., Disp: 40 tablet, Rfl: 3 .  Ubrogepant (UBRELVY) 100 MG TABS, Take 100 mg by mouth as needed. May repeat in 2 hours if needed, no more than 2 pills in 24 hours., Disp: 10  tablet, Rfl: 3 .  zonisamide (ZONEGRAN) 25 MG capsule, Take by mouth., Disp: , Rfl:   Allergies  Allergen Reactions  . Dairy Aid [Lactase] Anaphylaxis  . Other Hives and Anaphylaxis     shrimp, dairy products, shellfish (Swelling) Other reaction(s): Hives  . Shellfish Allergy Anaphylaxis  . Benadryl [Diphenhydramine Hcl] Hives  . Iodine Hives and Rash  . Tape Rash    Objective:   There were no vitals taken for this visit.  Patient is well-developed, well-nourished in no acute distress.  Resting comfortably at home.  Head is normocephalic, atraumatic.  No labored breathing.  Speech is clear and coherent with logical content.  Patient is alert and oriented at baseline.   Assessment and Plan:   1. Migraine without aura and without status migrainosus, not intractable Do not want her taking more Ubrelvy at present. Discussed flare of breakthrough migraines could be stemming from having to discontinue the Ajovy and only having one dose of the Emgality thus far. Other common triggers reviewed and she will be mindful of these. Will give Rx Medrol dose pack to break headache cycle. Rx Tizanidine given for muscle tension to see if this helps as well. She has been  instructed to follow-up with her neurologist for any non-resolving or new symptoms. Patient voiced understanding and agreement with the plan.     Piedad Climes, PA-C 08/08/2020

## 2020-08-16 ENCOUNTER — Ambulatory Visit (INDEPENDENT_AMBULATORY_CARE_PROVIDER_SITE_OTHER): Payer: No Typology Code available for payment source | Admitting: Family Medicine

## 2020-08-19 ENCOUNTER — Other Ambulatory Visit (HOSPITAL_COMMUNITY): Payer: Self-pay

## 2020-08-23 ENCOUNTER — Other Ambulatory Visit (HOSPITAL_COMMUNITY): Payer: Self-pay

## 2020-08-29 ENCOUNTER — Other Ambulatory Visit (HOSPITAL_COMMUNITY): Payer: Self-pay

## 2020-08-31 ENCOUNTER — Other Ambulatory Visit (HOSPITAL_COMMUNITY): Payer: Self-pay

## 2020-09-01 ENCOUNTER — Other Ambulatory Visit: Payer: Self-pay

## 2020-09-03 ENCOUNTER — Other Ambulatory Visit (HOSPITAL_COMMUNITY): Payer: Self-pay

## 2020-09-03 MED ORDER — CARESTART COVID-19 HOME TEST VI KIT
PACK | 0 refills | Status: DC
  Filled 2020-09-03: qty 4, 4d supply, fill #0

## 2020-09-05 ENCOUNTER — Encounter (INDEPENDENT_AMBULATORY_CARE_PROVIDER_SITE_OTHER): Payer: Self-pay | Admitting: Primary Care

## 2020-09-05 ENCOUNTER — Other Ambulatory Visit: Payer: Self-pay

## 2020-09-05 ENCOUNTER — Other Ambulatory Visit (HOSPITAL_COMMUNITY): Payer: Self-pay

## 2020-09-05 ENCOUNTER — Ambulatory Visit (INDEPENDENT_AMBULATORY_CARE_PROVIDER_SITE_OTHER): Payer: No Typology Code available for payment source | Admitting: Primary Care

## 2020-09-05 VITALS — BP 127/84 | HR 73 | Temp 97.3°F | Ht 64.0 in | Wt 311.8 lb

## 2020-09-05 DIAGNOSIS — F32A Depression, unspecified: Secondary | ICD-10-CM | POA: Diagnosis not present

## 2020-09-05 DIAGNOSIS — F419 Anxiety disorder, unspecified: Secondary | ICD-10-CM

## 2020-09-05 DIAGNOSIS — G43009 Migraine without aura, not intractable, without status migrainosus: Secondary | ICD-10-CM

## 2020-09-05 DIAGNOSIS — Z7689 Persons encountering health services in other specified circumstances: Secondary | ICD-10-CM

## 2020-09-05 MED ORDER — ESCITALOPRAM OXALATE 20 MG PO TABS
20.0000 mg | ORAL_TABLET | Freq: Every day | ORAL | 0 refills | Status: DC
  Filled 2020-09-05: qty 90, 90d supply, fill #0

## 2020-09-05 NOTE — Progress Notes (Signed)
New Patient Office Visit  Subjective:  Patient ID: Felicia Salinas, female    DOB: Jul 29, 1990  Age: 30 y.o. MRN: [id number]  CC:  Chief Complaint  Patient presents with   New Patient (Initial Visit)    Weight loss and anxiety    HPI Ms. Felicia Salinas is 30 years old morbid obese female who presents for establishment of care. She voices some concerns about her weight and episodes of anxiety.   Past Medical History:  Diagnosis Date   Angio-edema    Anxiety    Asthma    controlled per patient last attack in January   Environmental allergies    Obesity    Urticaria     Past Surgical History:  Procedure Laterality Date   APPENDECTOMY  04/07/2011   CHOLECYSTECTOMY  10/18/2011   Procedure: LAPAROSCOPIC CHOLECYSTECTOMY WITH INTRAOPERATIVE CHOLANGIOGRAM;  Surgeon: Rolm Bookbinder, MD;  Location: [facility];  Service: General;  Laterality: N/A;   IR ANGIO INTRA EXTRACRAN SEL COM CAROTID INNOMINATE UNI R MOD SED  06/02/2019   IR ANGIO INTRA EXTRACRAN SEL INTERNAL CAROTID UNI L MOD SED  06/02/2019   IR ANGIO VERTEBRAL SEL VERTEBRAL UNI R MOD SED  06/02/2019   IR US GUIDE VASC ACCESS RIGHT  06/02/2019   TONSILLECTOMY     WISDOM TOOTH EXTRACTION      Family History  Problem Relation Age of Onset   Cancer Mother        breast and spinal CA   Asthma Mother    Healthy Father    Asthma Father    Breast cancer Maternal Grandmother    Diabetes Maternal Grandmother    Rheum arthritis Maternal Grandmother    Ovarian cancer Paternal Aunt    Ovarian cancer Maternal Aunt    Diabetes Sister    Diabetes Paternal Grandfather    Hypertension Maternal Grandfather    Colon cancer Neg Hx     Social History   Socioeconomic History   Marital status: Married    Spouse name: Not on file   Number of children: 0   Years of education: Not on file   Highest education level: Not on file  Occupational History   Occupation: Stage manager: [employer]  Tobacco Use   Smoking status: Never    Smokeless tobacco: Never  Vaping Use   Vaping Use: Never used  Substance and Sexual Activity   Alcohol use: Not Currently    Comment: Occasional   Drug use: No   Sexual activity: Yes    Birth control/protection: None  Other Topics Concern   Not on file  Social History Narrative   Not on file   Social Determinants of Health   Financial Resource Strain: Not on file  Food Insecurity: Not on file  Transportation Needs: Not on file  Physical Activity: Not on file  Stress: Not on file  Social Connections: Not on file  Intimate Partner Violence: Not on file    ROS Review of Systems  Cardiovascular:  Positive for leg swelling.       Bilateral edema   Neurological:  Positive for headaches.       Hx of migraines   Psychiatric/Behavioral:  Positive for agitation and sleep disturbance. The patient is nervous/anxious.   All other systems reviewed and are negative.  Objective:   Today's Vitals: BP 127/84 (BP Location: Right Arm, Patient Position: Sitting, Cuff Size: Large)   Pulse 73   Temp (!) 97.3 F (36.3 C) (Temporal)  Ht _0  (1.626 m)   Wt (!) 311 lb 12.8 oz (141.4 kg)   LMP 08/15/2020 (Exact Date)   SpO2 96%   BMI 53.52 kg/m   Physical Exam Vitals reviewed.  Constitutional:      Appearance: She is obese.  HENT:     Head: Normocephalic.     Comments: Wears glasses    Right Ear: Tympanic membrane and external ear normal.     Left Ear: Tympanic membrane and external ear normal.     Nose: Nose normal.  Eyes:     Extraocular Movements: Extraocular movements intact.     Pupils: Pupils are equal, round, and reactive to light.  Cardiovascular:     Rate and Rhythm: Normal rate and regular rhythm.  Pulmonary:     Effort: Pulmonary effort is normal.     Breath sounds: Normal breath sounds.     Comments: Sleep apnea on Cpap Abdominal:     General: Bowel sounds are normal. There is distension.     Palpations: Abdomen is soft.  Musculoskeletal:        General:  Normal range of motion.     Cervical back: Normal range of motion and neck supple.  Skin:    General: Skin is warm and dry.  Neurological:     Mental Status: She is alert and oriented to person, place, and time.  Psychiatric:        Mood and Affect: Mood normal.        Behavior: Behavior normal.        Thought Content: Thought content normal.        Judgment: Judgment normal.    Assessment & Plan:  Felicia Salinas was seen today for new patient (initial visit).  Diagnoses and all orders for this visit:  Migraine without aura and without status migrainosus, not intractable Followed by neurology   Encounter to establish care Establish care with new PCP  Morbid obesity ([place]) Class 3 severe obesity due to excess  calories without serious comorbidity with body mass index (BMI) of 40.0 to 44.9 in adult [facility]) - program of caloric reduction, exercise and behavioral modification recommended. If patient looses with out fasting naturally counting calories, & exercising -goal 10lbs loss will discuss appetite suppressent if not counter indication with current medication.      Anxiety and depression Depression screen [facility] 2/9 09/05/2020 04/04/2020 01/27/2020 10/16/2019 10/16/2019  Decreased Interest 2 0 0 2 0  Down, Depressed, Hopeless 2 0 0 3 0  PHQ - 2 Score 4 0 0 5 0  Altered sleeping 2 - - 3 -  Tired, decreased energy 1 - - 3 -  Change in appetite 2 - - 3 -  Feeling bad or failure about yourself  2 - - 3 -  Trouble concentrating 1 - - 2 -  Moving slowly or fidgety/restless 0 - - 1 -  Suicidal thoughts 1 - - 3 -  PHQ-9 Score 13 - - 23 -  Difficult doing work/chores - - - Not difficult at all -  Stop taking Lexapro did not feel it was helping restarted it increase does for anxiety, depression and binge eating -     escitalopram (LEXAPRO) 20 MG tablet; Take 1 tablet (20 mg total) by mouth daily.    Outpatient Encounter Medications as of 09/05/2020  Medication Sig   escitalopram (LEXAPRO) 20 MG  tablet Take 1 tablet (20 mg total) by mouth daily.   albuterol (VENTOLIN HFA) 108 (90 Base) MCG/ACT  inhaler Inhale 2 puffs into the lungs every 4 (four) hours as needed for wheezing or shortness of breath (coughing).   COVID-19 At Home Antigen Test [facility] COVID-19 HOME TEST) KIT Use as directed   fluticasone (FLONASE) 50 MCG/ACT nasal spray Place 1 spray into both nostrils 2 (two) times daily as needed for rhinitis.   Galcanezumab-gnlm (EMGALITY) 120 MG/ML SOAJ Inject 1 pen every 30 days as directed.   levocetirizine (XYZAL) 5 MG tablet Take 1 tablet (5 mg total) by mouth every evening.   methylPREDNISolone (MEDROL DOSEPAK) 4 MG TBPK tablet Take following package directions   montelukast (SINGULAIR) 10 MG tablet Take 1 tablet (10 mg total) by mouth at bedtime.   ondansetron (ZOFRAN) 4 MG tablet TAKE 1 TABLET (4 MG TOTAL) BY MOUTH EVERY 8 (EIGHT) HOURS AS NEEDED FOR NAUSEA OR VOMITING.   rizatriptan (MAXALT) 10 MG tablet Take 1 tablet (10 mg total) by mouth as needed for migraine. May repeat in 2 hours if needed   Ubrogepant (UBRELVY) 100 MG TABS Take 100 mg by mouth as needed. May repeat in 2 hours if needed, no more than 2 pills in 24 hours.   [DISCONTINUED] albuterol (VENTOLIN HFA) 108 (90 Base) MCG/ACT inhaler INHALE 2 PUFFS INTO THE LUNGS EVERY 6 HOURS AS NEEDED FOR WHEEZING OR SHORTNESS OF BREATH.   [DISCONTINUED] escitalopram (LEXAPRO) 10 MG tablet Take 1 tablet (10 mg total) by mouth daily.   [DISCONTINUED] escitalopram (LEXAPRO) 10 MG tablet TAKE 1 TABLET BY MOUTH ONCE A DAY   [DISCONTINUED] Fremanezumab-vfrm (AJOVY) 225 MG/1.5ML SOAJ Inject 225 mg into the skin every 30 (thirty) days.   [DISCONTINUED] QUEtiapine (SEROQUEL) 100 MG tablet Take 1 tablet (100 mg total) by mouth at bedtime.   [DISCONTINUED] QUEtiapine (SEROQUEL) 100 MG tablet TAKE 1 TABLET BY MOUTH ONCE A DAY AT BEDTIME   [DISCONTINUED] tiZANidine (ZANAFLEX) 4 MG tablet Take 1 tablet (4 mg total) by mouth every 6 (six) hours  as needed for muscle spasms (and/or neck tension).   No facility-administered encounter medications on file as of 09/05/2020.    Follow-up: Return in about 2 months (around 11/05/2020) for follow up on medication / schedule a day when sw will be in ??? Thursday .   Kerin Perna, NP

## 2020-09-08 ENCOUNTER — Ambulatory Visit: Payer: No Typology Code available for payment source | Admitting: Family Medicine

## 2020-09-19 ENCOUNTER — Encounter: Payer: Self-pay | Admitting: Adult Health

## 2020-09-29 ENCOUNTER — Encounter: Payer: Self-pay | Admitting: Neurology

## 2020-09-29 ENCOUNTER — Other Ambulatory Visit: Payer: Self-pay

## 2020-09-29 ENCOUNTER — Other Ambulatory Visit (HOSPITAL_COMMUNITY): Payer: Self-pay

## 2020-09-29 ENCOUNTER — Ambulatory Visit (INDEPENDENT_AMBULATORY_CARE_PROVIDER_SITE_OTHER): Payer: No Typology Code available for payment source | Admitting: Neurology

## 2020-09-29 VITALS — BP 119/81 | HR 73 | Ht 64.0 in | Wt 309.0 lb

## 2020-09-29 DIAGNOSIS — G4733 Obstructive sleep apnea (adult) (pediatric): Secondary | ICD-10-CM

## 2020-09-29 DIAGNOSIS — G43019 Migraine without aura, intractable, without status migrainosus: Secondary | ICD-10-CM

## 2020-09-29 DIAGNOSIS — Z9989 Dependence on other enabling machines and devices: Secondary | ICD-10-CM | POA: Diagnosis not present

## 2020-09-29 MED ORDER — NURTEC 75 MG PO TBDP
75.0000 mg | ORAL_TABLET | Freq: Once | ORAL | 3 refills | Status: DC | PRN
  Filled 2020-09-29: qty 8, 30d supply, fill #0
  Filled 2021-01-15: qty 8, 30d supply, fill #1
  Filled 2021-03-12: qty 8, 30d supply, fill #2

## 2020-09-29 MED ORDER — EMGALITY 120 MG/ML ~~LOC~~ SOAJ
SUBCUTANEOUS | 3 refills | Status: DC
  Filled 2020-09-29: qty 1, 30d supply, fill #0
  Filled 2020-11-09: qty 1, 30d supply, fill #1
  Filled 2020-12-02: qty 1, 30d supply, fill #2
  Filled 2020-12-27: qty 1, 30d supply, fill #3

## 2020-09-29 NOTE — Progress Notes (Signed)
Subjective:    Patient ID: Felicia Salinas is a 30 y.o. female.  HPI    Interim history:   Felicia Salinas is a 30 year old right-handed woman with an underlying medical history of allergies, asthma, recurrent headaches and morbid obesity with a BMI of over 29, who presents for follow-up consultation of her severe obstructive sleep apnea and recurrent headaches. I Last saw him on 06/06/2020 at which time she was noncompliant with her CPAP.  She was encouraged to be fully compliant with her CPAP due to severe underlying obstructive sleep apnea and how it may help with her headaches.  She was advised to follow-up with her ophthalmologist as scheduled and continue with Ajovy injections.  Due to insurance coverage change we switched her to [facility] injections in April 2022.  She had an interim telemedicine follow-up with her primary care PA on 08/08/2020 and I reviewed the note.  She was given a prescription for tizanidine as well as a Medrol Dosepak.  She was advised not to use Ubrelvy frequently.  As she was taking 1 pill every other day for about 2 weeks.  She had a follow-up with her primary care nurse practitioner on 09/05/2020 and I reviewed the note.  Today, 09/29/2020: I reviewed her CPAP compliance data for the past 30 days from 08/28/2020 through 09/26/2020, during which time she used her machine only 14 days with percent use days greater than 4 hours at 43%, indicating suboptimal compliance with an average usage of 5 hours and 47 minutes for days on treatment, residual AHI at goal at 1.1/h, leak acceptable with a 95th percentile at 14.7 L/min on a pressure of 11 cm with EPR of 3.  She reports doing better with her CPAP.  She found that the nasal pillows and the most tolerable.  She tries to be consistent with it.  In fact, when she is on it she averages almost 6 hours on it.  She still has frequent migraines, started Emgality about 3 months ago, is tolerating the injection thus far.  She would like to try  something different than Roselyn Meier which has helped a little bit but not consistently.  She reports that she was supposed to have a routine follow-up with Felicia Salinas in 1 year after the last exam.  She has not had any new visual symptoms.  She tries to hydrate well with water.  She limits her caffeine.  Previously:   I saw her on 03/08/2020, at which time we talked about her sleep study results.  She was having migrainous headaches.  She had previously followed with Felicia Salinas for migraines and had been on baclofen, Zonegran and Topamax.  She was working on weight loss.  She was willing to try a CPAP nasal mask.  She was encouraged to be consistent with using her CPAP.  I received an interim office report from [facility]inas clinic.  She had possible mild swelling of the optic nerves but no florid papilledema.  She was advised to follow-up routinely in 1 year with Felicia Salinas. She was advised to start Ajovy injections.  She was advised to continue Maxalt as needed.   I reviewed her CPAP compliance data from 01/26/2020 through 02/24/2020, which is a total of 30 days, during which time she used her CPAP only 3 days, indicating poor compliance, in the past 90 days she used her machine 18 out of 90 days, also indicating very low compliance with an average usage of 5 hours and 36 minutes for days  on treatment, percent use days greater than 4 hours at 16% only, average AHI 0.5/h, leak on the higher end with a 95th percentile at 21.8 L/min on a pressure of 13 cm with EPR of 3.     She had a brain MRI with and without contrast on 07/09/2019 and I reviewed the results:   IMPRESSION: 1. Partially empty sella, often a normal anatomic variant can be associated with idiopathic intracranial hypertension (pseudotumor cerebri). CSF opening pressure measurement would evaluate further. 2. Otherwise normal MRI appearance of the brain.   She had a cerebral angiogram on 06/02/2019 which was reported as normal without evidence of  intracranial aneurysm or AV malformation or high flow fistula.   A prior brain MRA without contrast on 04/25/2019 showed: IMPRESSION: 1. 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 A Neuro-Endovascular consultation is suggested (i.e. with Neurosurgery or Neuro-interventional Radiology) to evaluate the appropriateness of potential treatment. 2. Otherwise normal intracranial MRA.   The patient's allergies, current medications, family history, past medical history, past social history, past surgical history and problem list were reviewed and updated as appropriate.        The patient missed a follow-up appointment on 11/23/2019. I saw her on 07/23/2019, at which time she reported reported struggling with her AutoPap.  She had difficulty tolerating the pressure and would also take the mask off in the middle of the night.  She was advised to return for a full night CPAP titration study to optimize treatment settings and mask fitting.  She was agreeable to this.   She had a CPAP titration study on 08/30/2019, during which time her sleep efficiency was 89.5%, sleep latency 31 minutes, REM latency delayed at 209 minutes.  She was fitted with a large Simplus full facemask and CPAP was initiated at 5 cm and titrated to a final pressure of 13 cm, at which time her AHI was 0/h with nonsupine REM sleep achieved an O2 nadir of 96%.  She was advised that start treatment at home in the form of CPAP as opposed to AutoPap therapy. Set up date was 05/13/19.     I first met her at the request of her primary care physician on 03/31/2019, at which time the patient reported snoring and excessive daytime somnolence as well as witnessed apneas.  She was advised to proceed with sleep study testing.  Her insurance denied a laboratory attended sleep study and therefore she was advised to proceed with a home sleep test. She had a home sleep test on 04/13/19, which indicated severe  obstructive sleep apnea with a total AHI of 36.8/hour and O2 nadir of 76%. She was advised to start autoPAP therapy at home as her insurance did not approve an attended sleep study.   I reviewed her autoPAP compliance data from 06/23/2019 through 07/22/2019, which is a total of 30 days, during which time she used her machine 17 days with percent use days greater than 4 hours at 53%, indicating suboptimal compliance, average usage of 5 hours and 38 minutes, residual AHI at goal at 0.2/h, leak in the higher acceptable range with the 95th percentile at 18.5 L/min, average pressure for the 95th percentile at 12.3 cm with a range of 7 cm to 13 cm.  In the initial 30 days, set up date was 05/13/2019, she was better with her compliance, percent use days greater than 4 hours in the first month was 70% which was adequate, average usage and the  rest of the compliance download showed similar findings, leak was just a little bit less and average pressure was also just a little bit less at 11.8 cm.      03/31/19: (She) reports snoring and excessive daytime somnolence, as well as witnessed apneas per husband's report and frequent morning headaches.  I reviewed your office note from 03/24/2019.  She has been referred to the headache wellness center.  She has never had a sleep study.  Her Epworth sleepiness score is 14 out of 24, fatigue severity score is 40 out of 63.  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  She is currently working from home for [employer].  She is in bed generally around midnight and rise time is around 6 because she has to take the dogs out.  She has 2 dogs, they do not sleep in the bedroom with them.  She does have a TV on at night in her bedroom on a sleep timer.  She has no night to night nocturia but has woken up with a headache.  She had a tonsillectomy and adenoidectomy in sixth grade.  Her father has sleep apnea but she is unsure if he has a CPAP machine.  She is working on weight  loss and has lost about 30 pounds thus far.  She is a non-smoker and does not utilize alcohol and does not drink caffeine on a daily basis.  She denies any telltale symptoms of restless leg syndrome but has been noted to twitch or kicking her sleep per husband's feedback to her.   Her Past Medical History Is Significant For: Past Medical History:  Diagnosis Date   Angio-edema    Anxiety    Asthma    controlled per patient last attack in January   Environmental allergies    Obesity    Urticaria     Her Past Surgical History Is Significant For: Past Surgical History:  Procedure Laterality Date   APPENDECTOMY  04/07/2011   CHOLECYSTECTOMY  10/18/2011   Procedure: LAPAROSCOPIC CHOLECYSTECTOMY WITH INTRAOPERATIVE CHOLANGIOGRAM;  Surgeon: Rolm Bookbinder, MD;  Location: [facility];  Service: General;  Laterality: N/A;   IR ANGIO INTRA EXTRACRAN SEL COM CAROTID INNOMINATE UNI R MOD SED  06/02/2019   IR ANGIO INTRA EXTRACRAN SEL INTERNAL CAROTID UNI L MOD SED  06/02/2019   IR ANGIO VERTEBRAL SEL VERTEBRAL UNI R MOD SED  06/02/2019   IR US GUIDE VASC ACCESS RIGHT  06/02/2019   TONSILLECTOMY     WISDOM TOOTH EXTRACTION      Her Family History Is Significant For: Family History  Problem Relation Age of Onset   Cancer Mother        breast and spinal CA   Asthma Mother    Healthy Father    Asthma Father    Breast cancer Maternal Grandmother    Diabetes Maternal Grandmother    Rheum arthritis Maternal Grandmother    Ovarian cancer Paternal Aunt    Ovarian cancer Maternal Aunt    Diabetes Sister    Diabetes Paternal Grandfather    Hypertension Maternal Grandfather    Colon cancer Neg Hx     Her Social History Is Significant For: Social History   Socioeconomic History   Marital status: Married    Spouse name: Not on file   Number of children: 0   Years of education: Not on file   Highest education level: Not on file  Occupational History   Occupation:  representative    Employer: [employer]   Tobacco Use   Smoking status: Never   Smokeless tobacco: Never  Vaping Use   Vaping Use: Never used  Substance and Sexual Activity   Alcohol use: Not Currently    Comment: Occasional   Drug use: No   Sexual activity: Yes    Birth control/protection: None  Other Topics Concern   Not on file  Social History Narrative   Lives at home with husband and cat   Left handed   Caffeine: frappe occasionally    Social Determinants of Health   Financial Resource Strain: Not on file  Food Insecurity: Not on file  Transportation Needs: Not on file  Physical Activity: Not on file  Stress: Not on file  Social Connections: Not on file    Her Allergies Are:  Allergies  Allergen Reactions   Dairy Aid [Lactase] Anaphylaxis   Other Hives and Anaphylaxis     shrimp, dairy products, shellfish (Swelling) Other reaction(s): Hives   Shellfish Allergy Anaphylaxis   Benadryl [Diphenhydramine Hcl] Hives   Iodine Hives and Rash   Tape Rash  :   Her Current Medications Are:  Outpatient Encounter Medications as of 09/29/2020  Medication Sig   albuterol (VENTOLIN HFA) 108 (90 Base) MCG/ACT inhaler Inhale 2 puffs into the lungs every 4 (four) hours as needed for wheezing or shortness of breath (coughing).   COVID-19 At Home Antigen Test (CARESTART COVID-19 HOME TEST) KIT Use as directed   escitalopram (LEXAPRO) 20 MG tablet Take 1 tablet (20 mg total) by mouth daily.   fluticasone (FLONASE) 50 MCG/ACT nasal spray Place 1 spray into both nostrils 2 (two) times daily as needed for rhinitis.   levocetirizine (XYZAL) 5 MG tablet Take 1 tablet (5 mg total) by mouth every evening.   montelukast (SINGULAIR) 10 MG tablet Take 1 tablet (10 mg total) by mouth at bedtime.   Rimegepant Sulfate (NURTEC) 75 MG TBDP Take 75 mg by mouth once as needed for up to 1 dose. May repeat once after 2 hours.  No more than 2 pills in 24 hours.   [DISCONTINUED] Galcanezumab-gnlm (EMGALITY) 120 MG/ML SOAJ Inject 1 pen every 30  days as directed.   [DISCONTINUED] Ubrogepant (UBRELVY) 100 MG TABS Take 100 mg by mouth as needed. May repeat in 2 hours if needed, no more than 2 pills in 24 hours.   Galcanezumab-gnlm (EMGALITY) 120 MG/ML SOAJ Inject 1 pen every 30 days as directed.   [DISCONTINUED] methylPREDNISolone (MEDROL DOSEPAK) 4 MG TBPK tablet Take following package directions   [DISCONTINUED] ondansetron (ZOFRAN) 4 MG tablet TAKE 1 TABLET (4 MG TOTAL) BY MOUTH EVERY 8 (EIGHT) HOURS AS NEEDED FOR NAUSEA OR VOMITING. (Patient not taking: Reported on 09/29/2020)   [DISCONTINUED] rizatriptan (MAXALT) 10 MG tablet Take 1 tablet (10 mg total) by mouth as needed for migraine. May repeat in 2 hours if needed (Patient not taking: Reported on 09/29/2020)   No facility-administered encounter medications on file as of 09/29/2020.  :  Review of Systems:  Out of a complete 14 point review of systems, all are reviewed and negative with the exception of these symptoms as listed below:  Review of Systems  Neurological:        Patient is here for follow-up of her CPAP and headaches. She reports her headaches have been intense to the point where she wants to rip her eye out. She has been taking [place] which kind of helps however the headache is still there.  She takes Emgality once a month. She reports she uses her CPAP every night at least 4 hours. When she wakes up her right nostril hurts. She has the nasal mask and likes it.    Objective:  Neurological Exam  Physical Exam Physical Examination:   Vitals:   09/29/20 0723  BP: 119/81  Pulse: 73    General Examination: The patient is a very pleasant 30 y.o. female in no acute distress. She appears well-developed and well-nourished and well groomed.   HEENT: Normocephalic, atraumatic, pupils are equal, round and reactive to light, corrective eyeglasses in place.  Right eyelid is slightly droopy, appears stable, she states that she has a lazy eye.  She denies any blurry or double  vision currently. Speech is clear without dysarthria, hypophonia or voice tremor.  Neck is supple, no carotid bruits.     Chest: Clear to auscultation without wheezing, rhonchi or crackles noted.   Heart: S1+S2+0, regular and normal without murmurs, rubs or gallops noted.   Abdomen: Soft, non-tender and non-distended.   Extremities: There is no pitting edema in the distal lower extremities bilaterally.   Skin: Warm and dry without trophic changes noted.   Musculoskeletal: exam reveals no obvious joint deformities.   Neurologically: Mental status: The patient is awake, alert and oriented in all 4 spheres. Her immediate and remote memory, attention, language skills and fund of knowledge are appropriate. There is no evidence of aphasia, agnosia, apraxia or anomia. Speech is clear with normal prosody and enunciation. Thought process is linear. Mood is normal and affect is normal. Cranial nerves II - XII are as described above under HEENT exam. Motor exam: Normal bulk, strength and tone is noted. There is no tremor, fine motor skills and coordination: grossly intact.  Reflexes are 1+ throughout.  Cerebellar testing: No dysmetria or intention tremor. There is no truncal or gait ataxia.  Sensory exam: intact to light touch in the upper and lower extremities. Gait, station and balance: She stands easily. No veering to one side is noted. No leaning to one side is noted. Posture is age-appropriate and stance is narrow based. Gait shows normal stride length and normal pace. No problems turning are noted. Romberg is negative, tandem walk unremarkable.   Assessment and Plan:  In summary, Mirel E Marion is a very pleasant 30 year old female with an underlying medical history of allergies, asthma, recurrent headaches and morbid obesity with a BMI of over 40, who presents for  follow-up consultation of her severe obstructive sleep apnea and migraine headaches.  She was started on Ajovy injections in  December 2021.  Due to change in insurance coverage we switched her to [facility] injections which she started in late April or early May 2022.  She has struggled with her CPAP compliance but has improved her compliance after our last visit in March 2022.  She still has suboptimal usage but is motivated to continue with her CPAP.  I do believe that her recurrent headaches are in part tied in with underlying severe sleep apnea which is partially treated at this point.  She is advised to continue with Emgality injections.  We have tried her on Ubrelvy, which has not been as effective as hoped.  She would like to try something different.  I suggested we start her on Nurtec and stop the [place].  She is agreeable. I again advised her, for overall cardiovascular health, eye health and her headaches, she will benefit from treating her severe sleep apnea consistently.  Of note, she had a home sleep test in January 2021 and was initially on AutoPap therapy but had trouble tolerating it.  She had a subsequent CPAP titration and a treatment pressure of 13 cm was determined as optimal. She has a history of migraines and previously followed with Felicia Salinas.  She has tried and failed Topamax, baclofen and Zonegran through Felicia Salinas.  She also tried and failed trigger point injections. She had a brain MRI earlier in 2021 which showed empty sella.  I referred her to ophthalmology subsequently.  She has had no recent new visual symptoms.  She was advised to follow-up by Felicia Salinas routinely in 1 year after her last exam.  She recently tried Maxalt through her primary care. Today, we talked about potentially sending her to a headache specialist or a headache treatment center, such as at [facility] or [facility].  She is offered a referral today but would like to hold off.  We will continue with Emgality and try Nurtec as needed.  She is willing to be more consistent with her CPAP.  She is advised to follow-up to see one of our nurse  practitioners in 3 to 4 months, sooner if needed.  We talked about headache triggers again today.  She is advised to stay well-hydrated, well rested, limit her caffeine.  I answered all her questions today and she was in agreement with the plan. I spent 30 minutes in total face-to-face time and in reviewing records during pre-charting, more than 50% of which was spent in counseling and coordination of care, reviewing test results, reviewing medications and treatment regimen and/or in discussing or reviewing the diagnosis of migraines, the prognosis and treatment options. Pertinent laboratory and imaging test results that were available during this visit with the patient were reviewed by me and considered in my medical decision making (see chart for details).

## 2020-09-29 NOTE — Patient Instructions (Addendum)
We will stop the Ubrelvy and start you for as needed use for acute migraine on Nurtec 75 mg strength: Take 1 pill at onset of migraine headache, may repeat in 2 hours, no more than 2 pills in 24 hours. May cause sedation and nausea.   You have indeed done a better job using your CPAP.  Keep up the good work!  Please try not to skip any nights.  Please continue using your CPAP regularly. While your insurance requires that you use CPAP at least 4 hours each night on 70% of the nights, I recommend, that you not skip any nights and use it throughout the night if you can. Getting used to CPAP and staying with the treatment long term does take time and patience and discipline. Untreated obstructive sleep apnea when it is moderate to severe can have an adverse impact on cardiovascular health and raise her risk for heart disease, arrhythmias, hypertension, congestive heart failure, stroke and diabetes. Untreated obstructive sleep apnea causes sleep disruption, nonrestorative sleep, and sleep deprivation. This can have an impact on your day to day functioning and cause daytime sleepiness and impairment of cognitive function, memory loss, mood disturbance, and problems focussing. Using CPAP regularly can improve these symptoms.  In the future, we can consider referring you to a headache specialist or headache center such as at [facility] or [facility].

## 2020-10-03 ENCOUNTER — Other Ambulatory Visit: Payer: Self-pay

## 2020-10-03 ENCOUNTER — Encounter (HOSPITAL_BASED_OUTPATIENT_CLINIC_OR_DEPARTMENT_OTHER): Payer: Self-pay

## 2020-10-03 ENCOUNTER — Telehealth: Payer: Self-pay | Admitting: Primary Care

## 2020-10-03 ENCOUNTER — Emergency Department (HOSPITAL_BASED_OUTPATIENT_CLINIC_OR_DEPARTMENT_OTHER)
Admission: EM | Admit: 2020-10-03 | Discharge: 2020-10-03 | Disposition: A | Discharge status: Home / Self Care | Payer: No Typology Code available for payment source | Attending: Emergency Medicine | Admitting: Emergency Medicine

## 2020-10-03 ENCOUNTER — Other Ambulatory Visit (HOSPITAL_BASED_OUTPATIENT_CLINIC_OR_DEPARTMENT_OTHER): Payer: Self-pay

## 2020-10-03 ENCOUNTER — Emergency Department (HOSPITAL_BASED_OUTPATIENT_CLINIC_OR_DEPARTMENT_OTHER): Payer: No Typology Code available for payment source

## 2020-10-03 DIAGNOSIS — R2240 Localized swelling, mass and lump, unspecified lower limb: Secondary | ICD-10-CM | POA: Diagnosis not present

## 2020-10-03 DIAGNOSIS — J45909 Unspecified asthma, uncomplicated: Secondary | ICD-10-CM | POA: Insufficient documentation

## 2020-10-03 DIAGNOSIS — Z7952 Long term (current) use of systemic steroids: Secondary | ICD-10-CM | POA: Diagnosis not present

## 2020-10-03 DIAGNOSIS — U071 COVID-19: Secondary | ICD-10-CM | POA: Insufficient documentation

## 2020-10-03 DIAGNOSIS — R0602 Shortness of breath: Secondary | ICD-10-CM | POA: Diagnosis present

## 2020-10-03 DIAGNOSIS — R0682 Tachypnea, not elsewhere classified: Secondary | ICD-10-CM | POA: Insufficient documentation

## 2020-10-03 LAB — RESP PANEL BY RT-PCR (FLU A&B, COVID) ARPGX2
Influenza A by PCR: NEGATIVE
Influenza B by PCR: NEGATIVE
SARS Coronavirus 2 by RT PCR: POSITIVE — AB

## 2020-10-03 LAB — BASIC METABOLIC PANEL
Anion gap: 14 (ref 5–15)
BUN: 9 mg/dL (ref 6–20)
CO2: 21 mmol/L — ABNORMAL LOW (ref 22–32)
Calcium: 9.9 mg/dL (ref 8.9–10.3)
Chloride: 102 mmol/L (ref 98–111)
Creatinine, Ser: 0.78 mg/dL (ref 0.44–1.00)
GFR, Estimated: >60 mL/min (ref >60)
Glucose, Bld: 99 mg/dL (ref 70–99)
Potassium: 3.4 mmol/L — ABNORMAL LOW (ref 3.5–5.1)
Sodium: 137 mmol/L (ref 135–145)

## 2020-10-03 LAB — CBC WITH DIFFERENTIAL/PLATELET
Abs Immature Granulocytes: 0.01 10*3/uL (ref 0.00–0.07)
Basophils Absolute: 0 10*3/uL (ref 0.0–0.1)
Basophils Relative: 1 %
Eosinophils Absolute: 0.1 10*3/uL (ref 0.0–0.5)
Eosinophils Relative: 3 %
HCT: 35.3 % — ABNORMAL LOW (ref 36.0–46.0)
Hemoglobin: 11.5 g/dL — ABNORMAL LOW (ref 12.0–15.0)
Immature Granulocytes: 0 %
Lymphocytes Relative: 38 %
Lymphs Abs: 1.8 10*3/uL (ref 0.7–4.0)
MCH: 28.5 pg (ref 26.0–34.0)
MCHC: 32.6 g/dL (ref 30.0–36.0)
MCV: 87.4 fL (ref 80.0–100.0)
Monocytes Absolute: 0.6 10*3/uL (ref 0.1–1.0)
Monocytes Relative: 13 %
Neutro Abs: 2.2 10*3/uL (ref 1.7–7.7)
Neutrophils Relative %: 45 %
Platelets: 346 10*3/uL (ref 150–400)
RBC: 4.04 MIL/uL (ref 3.87–5.11)
RDW: 14.4 % (ref 11.5–15.5)
WBC: 4.8 10*3/uL (ref 4.0–10.5)
nRBC: 0 % (ref 0.0–0.2)

## 2020-10-03 LAB — PREGNANCY, URINE: Preg Test, Ur: NEGATIVE

## 2020-10-03 LAB — TROPONIN I (HIGH SENSITIVITY): Troponin I (High Sensitivity): <2 ng/L (ref <18)

## 2020-10-03 MED ORDER — POTASSIUM CHLORIDE CRYS ER 20 MEQ PO TBCR
40.0000 meq | EXTENDED_RELEASE_TABLET | Freq: Once | ORAL | Status: AC
  Administered 2020-10-03: 40 meq via ORAL
  Filled 2020-10-03: qty 2

## 2020-10-03 MED ORDER — NIRMATRELVIR/RITONAVIR (PAXLOVID)TABLET
3.0000 | ORAL_TABLET | Freq: Two times a day (BID) | ORAL | 0 refills | Status: AC
  Filled 2020-10-03: qty 30, 5d supply, fill #0

## 2020-10-03 MED ORDER — ALBUTEROL SULFATE (2.5 MG/3ML) 0.083% IN NEBU
5.0000 mg | INHALATION_SOLUTION | Freq: Once | RESPIRATORY_TRACT | Status: AC
  Administered 2020-10-03: 5 mg via RESPIRATORY_TRACT
  Filled 2020-10-03: qty 6

## 2020-10-03 MED ORDER — PREDNISONE 50 MG PO TABS
60.0000 mg | ORAL_TABLET | Freq: Once | ORAL | Status: AC
  Administered 2020-10-03: 60 mg via ORAL
  Filled 2020-10-03: qty 1

## 2020-10-03 MED ORDER — ALBUTEROL SULFATE (5 MG/ML) 0.5% IN NEBU
2.5000 mg | INHALATION_SOLUTION | Freq: Four times a day (QID) | RESPIRATORY_TRACT | 0 refills | Status: DC | PRN
  Filled 2020-10-03: qty 20, 10d supply, fill #0

## 2020-10-03 MED ORDER — IBUPROFEN 400 MG PO TABS
600.0000 mg | ORAL_TABLET | Freq: Once | ORAL | Status: AC
  Administered 2020-10-03: 600 mg via ORAL
  Filled 2020-10-03: qty 1

## 2020-10-03 NOTE — ED Triage Notes (Signed)
Pt presents with 2 day history of cough with shortness of breath that began this am.  States she is coughing up a little bit of phlegm but is unsure of the color.  States she has an history of asthma for which she took albuterol this am.

## 2020-10-03 NOTE — ED Notes (Signed)
ED Provider at bedside. 

## 2020-10-03 NOTE — Telephone Encounter (Signed)
Can patient receive Rx for breathing machine.

## 2020-10-03 NOTE — Telephone Encounter (Signed)
Copied from CRM [id number]. Topic: General - Other >> Oct 03, 2020 11:30 AM Gaetana Michaelis A wrote: Reason for CRM: Patient was seen at [facility] facility and diagnosed with COVID 19 today 10/03/20  The patient would like a prescription for a "breathing machine"  Please contact to further advise if needed

## 2020-10-03 NOTE — ED Provider Notes (Signed)
[place] [facility] EMERGENCY DEPT Provider Note   CSN: [id number] Arrival date & time: 10/03/20  0818     History Chief Complaint  Patient presents with   Shortness of Breath    Felicia Salinas is a 30 y.o. female.  HPI 30 year old female presents with acute dyspnea.  2 days ago she started coughing up sputum but she is not sure what color.  She is also had chest pain when she coughs that is sharp and dull in her chest.  No fevers.  No wheezing but started feeling dyspneic this morning.  Has chronic leg swelling that is unchanged from baseline.  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   Migraine 01/27/2020   Chronic migraine without aura, with intractable migraine, so stated, with status migrainosus 10/16/2019   OSA (obstructive sleep apnea) 10/16/2019   Insomnia 10/16/2019   Asthma 09/29/2018   Polycystic ovary syndrome 09/29/2018   Obesity (BMI 30-39.9) 04/11/2011    Past Surgical History:  Procedure Laterality Date   APPENDECTOMY  04/07/2011   CHOLECYSTECTOMY  10/18/2011   Procedure: LAPAROSCOPIC CHOLECYSTECTOMY WITH INTRAOPERATIVE CHOLANGIOGRAM;  Surgeon: Rolm Bookbinder, MD;  Location: [facility];  Service: General;  Laterality: N/A;   IR ANGIO INTRA EXTRACRAN SEL COM CAROTID INNOMINATE UNI R MOD SED  06/02/2019   IR ANGIO INTRA EXTRACRAN SEL INTERNAL CAROTID UNI L MOD SED  06/02/2019   IR ANGIO VERTEBRAL SEL VERTEBRAL UNI R MOD SED  06/02/2019   IR US GUIDE VASC ACCESS RIGHT  06/02/2019   TONSILLECTOMY     WISDOM TOOTH EXTRACTION        OB History   No obstetric history on file.     Family History  Problem Relation Age of Onset   Cancer Mother        breast and spinal CA   Asthma Mother    Healthy Father    Asthma Father    Breast cancer Maternal Grandmother    Diabetes Maternal Grandmother    Rheum arthritis Maternal Grandmother    Ovarian cancer Paternal Aunt    Ovarian cancer Maternal Aunt    Diabetes Sister    Diabetes Paternal Grandfather    Hypertension Maternal Grandfather    Colon cancer Neg Hx     Social History   Tobacco Use   Smoking status: Never   Smokeless tobacco: Never  Vaping Use   Vaping Use: Never used  Substance Use Topics   Alcohol use: Not Currently    Comment: Occasional   Drug use: No    Home Medications Prior to Admission medications   Medication Sig Start Date End Date Taking? Authorizing Provider  albuterol (VENTOLIN HFA) 108 (90 Base) MCG/ACT inhaler Inhale 2 puffs into the lungs every 4 (four) hours as needed for wheezing or shortness of breath (coughing). 07/04/20  Yes Garnet Sierras, DO  escitalopram (LEXAPRO) 20 MG tablet Take 1 tablet (20 mg total) by mouth daily. 09/05/20  Yes Kerin Perna, NP  levocetirizine (XYZAL) 5 MG tablet  Take 1 tablet (5 mg total) by mouth every evening. 07/04/20  Yes Garnet Sierras, DO  montelukast (SINGULAIR) 10 MG tablet Take 1 tablet (10 mg total) by mouth at bedtime. 07/04/20  Yes Garnet Sierras, DO  nirmatrelvir/ritonavir EUA (PAXLOVID) TABS Take 3 tablets by mouth 2 (two) times daily for 5 days. Patient GFR is >60. Take nirmatrelvir (150 mg) two tablets twice daily for 5 days and ritonavir (100 mg) one tablet twice daily for 5 days. 10/03/20 10/08/20 Yes Sherwood Gambler, MD  COVID-19 At Home Antigen Test [facility] COVID-19 HOME TEST) KIT Use as directed 09/03/20   Edmon Crape, RPH  fluticasone [facility]) 50 MCG/ACT nasal spray Place 1 spray into both nostrils 2 (two) times daily as needed for rhinitis. 07/04/20   Garnet Sierras, DO   Galcanezumab-gnlm (EMGALITY) 120 MG/ML SOAJ Inject 1 pen every 30 days as directed. 09/29/20   Star Age, MD  Rimegepant Sulfate (NURTEC) 75 MG TBDP Take 75 mg by mouth once as needed for up to 1 dose. May repeat once after 2 hours.  No more than 2 pills in 24 hours. 09/29/20   Star Age, MD    Allergies    Dairy aid [lactase], Other, Shellfish allergy, Benadryl [diphenhydramine hcl], Iodine, and Tape  Review of Systems   Review of Systems  Constitutional:  Negative for fever.  Respiratory:  Positive for cough and shortness of breath. Negative for wheezing.   Cardiovascular:  Positive for chest pain.  All other systems reviewed and are negative.  Physical Exam Updated Vital Signs BP 104/72 (BP Location: Right Arm)   Pulse 89   Temp 98.2 F (36.8 C) (Oral)   Resp 14   Ht $R'5\' 4"'ZZ$  (1.626 m)   Wt (!) 140.2 kg   LMP 09/26/2020   SpO2 100%   BMI 53.04 kg/m   Physical Exam Vitals and nursing note reviewed.  Constitutional:      Appearance: She is well-developed. She is obese.  HENT:     Head: Normocephalic and atraumatic.     Right Ear: External ear normal.     Left Ear: External ear normal.     Nose: Nose normal.  Eyes:     General:        Right eye: No discharge.        Left eye: No discharge.  Cardiovascular:     Rate and Rhythm: Normal rate and regular rhythm.     Heart sounds: Normal heart sounds.  Pulmonary:     Effort: Pulmonary effort is normal. Tachypnea present. No respiratory distress.     Breath sounds: Decreased breath sounds present.  Abdominal:     Palpations: Abdomen is soft.     Tenderness: There is no abdominal tenderness.  Skin:    General: Skin is warm and dry.  Neurological:     Mental Status: She is alert.  Psychiatric:        Mood and Affect: Mood is anxious.    ED Results / Procedures / Treatments   Labs (all labs ordered are listed, but only abnormal results are displayed) Labs Reviewed  RESP PANEL BY RT-PCR (FLU A&B, COVID) ARPGX2 -  Abnormal; Notable for the following components:      Result Value   SARS Coronavirus 2 by RT PCR POSITIVE (*)    All other components within normal limits  CBC WITH DIFFERENTIAL/PLATELET - Abnormal; Notable for the following components:   Hemoglobin 11.5 (*)    HCT 35.3 (*)  All other components within normal limits  BASIC METABOLIC PANEL - Abnormal; Notable for the following components:   Potassium 3.4 (*)    CO2 21 (*)    All other components within normal limits  PREGNANCY, URINE  TROPONIN I (HIGH SENSITIVITY)    EKG EKG Interpretation  Date/Time:  Monday October 03 2020 08:26:15 EDT Ventricular Rate:  75 PR Interval:  143 QRS Duration: 89 QT Interval:  372 QTC Calculation: 416 R Axis:   20 Text Interpretation: Sinus rhythm no acute ST/T changes No old tracing to compare Confirmed by Sherwood Gambler [id number]) on 10/03/2020 8:29:40 AM  Radiology DG Chest Portable 1 View  Result Date: 10/03/2020 CLINICAL DATA:  Cough, shortness of breath EXAM: PORTABLE CHEST 1 VIEW COMPARISON:  04/13/2014 FINDINGS: The heart size and mediastinal contours are within normal limits. Both lungs are clear. The visualized skeletal structures are unremarkable. IMPRESSION: Negative. Electronically Signed   By: Rolm Baptise M.D.   On: 10/03/2020 08:51    Procedures Procedures   Medications Ordered in ED Medications  potassium chloride SA (KLOR-CON) CR tablet 40 mEq (has no administration in time range)  albuterol (PROVENTIL) (2.5 MG/3ML) 0.083% nebulizer solution 5 mg (5 mg Nebulization Given 10/03/20 0827)  predniSONE (DELTASONE) tablet 60 mg (60 mg Oral Given 10/03/20 0858)  ibuprofen (ADVIL) tablet 600 mg (600 mg Oral Given 10/03/20 7425)    ED Course  I have reviewed the triage vital signs and the nursing notes.  Pertinent labs & imaging results that were available during my care of the patient were reviewed by me and considered in my medical decision making (see chart for details).    MDM  Rules/Calculators/A&P                          Patient was started on albuterol nebulizer prior to me seeing her.  She does feel some partial relief with this and with time she has become less anxious and is resting comfortably with no increased work of breathing.  She has never been hypoxic.  Initial labs and ECG and chest x-ray are all unremarkable.  Her COVID test has come back positive which likely explains her cough and symptoms.  She was originally given prednisone dose given concern that this might be asthma but there has never been wheezing and while she does have better work of breathing and better air movement, there is still no wheezing and so I think asthma exacerbation itself is pretty unlikely.  Given she is not hypoxic we will hold off on further steroids and have her follow-up with her PCP.  Highly doubt ACS, PE, dissection, bacterial pneumonia, etc.  Given return precautions.  Given her history of asthma and obesity she would qualify for Paxlovid. Final Clinical Impression(s) / ED Diagnoses Final diagnoses:  [id number]    Rx / DC Orders ED Discharge Orders          Ordered    nirmatrelvir/ritonavir EUA (PAXLOVID) TABS  2 times daily        10/03/20 1023             Sherwood Gambler, MD 10/03/20 1025

## 2020-10-03 NOTE — Discharge Instructions (Addendum)
If you develop high fever, severe cough or cough with blood, trouble breathing, severe headache, neck pain/stiffness, vomiting, or any other new/concerning symptoms then return to the ER for evaluation  

## 2020-10-04 ENCOUNTER — Telehealth: Payer: Self-pay | Admitting: *Deleted

## 2020-10-04 ENCOUNTER — Other Ambulatory Visit (HOSPITAL_BASED_OUTPATIENT_CLINIC_OR_DEPARTMENT_OTHER): Payer: Self-pay

## 2020-10-04 ENCOUNTER — Other Ambulatory Visit (HOSPITAL_COMMUNITY): Payer: Self-pay

## 2020-10-04 NOTE — Telephone Encounter (Signed)
Completed [employer] PA on Cover My Meds. Key: [id number]. Awaiting determination.

## 2020-10-04 NOTE — Telephone Encounter (Signed)
The request has been approved. The authorization is effective for a maximum of 6 fills from 10/04/2020 to 04/05/2021, as long as the member is enrolled in their current health plan. This has been approved for a quantity limit of 18 with a day supply limit of 30. A written notification letter will follow with additional details.  Approval letter faxed to pharmacy. Received a receipt of confirmation. Notified patient via mychart.

## 2020-10-05 ENCOUNTER — Other Ambulatory Visit (HOSPITAL_COMMUNITY): Payer: Self-pay

## 2020-10-05 ENCOUNTER — Other Ambulatory Visit (INDEPENDENT_AMBULATORY_CARE_PROVIDER_SITE_OTHER): Payer: Self-pay | Admitting: Primary Care

## 2020-10-05 MED ORDER — ALBUTEROL SULFATE HFA 108 (90 BASE) MCG/ACT IN AERS
2.0000 | INHALATION_SPRAY | RESPIRATORY_TRACT | 2 refills | Status: DC | PRN
  Filled 2020-10-05: qty 18, 16d supply, fill #0
  Filled 2021-09-19: qty 8.5, 25d supply, fill #1

## 2020-10-05 NOTE — Telephone Encounter (Signed)
Patient requesting a nebulizer because she has COVID. Called pt not needed for short term use but would send over SABA for shortness of breath. Explain complete Paxlovid eat and stay hydrated

## 2020-10-06 ENCOUNTER — Other Ambulatory Visit (HOSPITAL_COMMUNITY): Payer: Self-pay

## 2020-10-07 ENCOUNTER — Telehealth (INDEPENDENT_AMBULATORY_CARE_PROVIDER_SITE_OTHER): Payer: No Typology Code available for payment source | Admitting: Nurse Practitioner

## 2020-10-07 DIAGNOSIS — U071 COVID-19: Secondary | ICD-10-CM

## 2020-10-07 NOTE — Progress Notes (Signed)
Virtual Visit via Telephone Note  I connected with Felicia Salinas on 10/07/20 at  9:30 AM EDT by telephone and verified that I am speaking with the correct person using two identifiers.  Location: Patient: home Provider: office   I discussed the limitations, risks, security and privacy concerns of performing an evaluation and management service by telephone and the availability of in person appointments. I also discussed with the patient that there may be a patient responsible charge related to this service. The patient expressed understanding and agreed to proceed.   History of Present Illness:  Patient presents today for post covid care clinic visit.  This is a televisit today.  She was seen in the ED on 10/03/2020.  She was prescribed oral antiviral treatment.  She states that overall she is doing better.  She does still have a slight cough.  Today is her last day of oral antiviral treatment.    Observations/Objective:  Vitals with BMI 10/03/2020 10/03/2020 10/03/2020  Height - - 5\' 4"   Weight - - 309 lbs  BMI - - 53.01  Systolic 104 104 -  Diastolic 72 72 -  Pulse 76 89 80      Assessment and Plan:  Covid 19 Cough:   Stay well hydrated  Stay active  Deep breathing exercises  May take tylenol or fever or pain  Complete oral viral therapy as prescribed by ED   Follow up:  Follow up if needed     I discussed the assessment and treatment plan with the patient. The patient was provided an opportunity to ask questions and all were answered. The patient agreed with the plan and demonstrated an understanding of the instructions.   The patient was advised to call back or seek an in-person evaluation if the symptoms worsen or if the condition fails to improve as anticipated.  I provided 23 minutes of non-face-to-face time during this encounter.   , NP

## 2020-10-07 NOTE — Patient Instructions (Signed)
Covid 19 Cough:   Stay well hydrated  Stay active  Deep breathing exercises  May take tylenol or fever or pain  Complete oral viral therapy as prescribed by ED   Follow up:  Follow up if needed

## 2020-10-08 ENCOUNTER — Other Ambulatory Visit (HOSPITAL_COMMUNITY): Payer: Self-pay

## 2020-10-14 ENCOUNTER — Encounter (INDEPENDENT_AMBULATORY_CARE_PROVIDER_SITE_OTHER): Payer: Self-pay | Admitting: Primary Care

## 2020-10-16 ENCOUNTER — Telehealth: Payer: No Typology Code available for payment source | Admitting: Family

## 2020-10-16 NOTE — Progress Notes (Signed)
Attempted to connect with patient. No showed.   Jannifer Rodney, FNP

## 2020-10-17 ENCOUNTER — Ambulatory Visit: Payer: No Typology Code available for payment source | Admitting: Allergy

## 2020-10-17 ENCOUNTER — Other Ambulatory Visit (HOSPITAL_COMMUNITY): Payer: Self-pay

## 2020-10-17 DIAGNOSIS — J309 Allergic rhinitis, unspecified: Secondary | ICD-10-CM

## 2020-10-17 MED ORDER — CLOMIPHENE CITRATE 50 MG PO TABS
ORAL_TABLET | ORAL | 0 refills | Status: DC
  Filled 2020-10-17 – 2021-01-25 (×2): qty 5, 5d supply, fill #0

## 2020-10-17 NOTE — Progress Notes (Deleted)
Follow Up Note  RE: Chan Rosasco MRN: [id number] DOB: 1990-06-19 Date of Office Visit: 10/17/2020  Referring provider: Kerin Perna, NP Primary care provider: Kerin Perna, NP  Chief Complaint: No chief complaint on file.  History of Present Illness: I had the pleasure of seeing Chasitee Zenker for a follow up visit at the [facility] on 10/17/2020. She is a 30 y.o. female, who is being followed for allergic rhinitis, asthma, urticaria, adverse food reaction. Her previous allergy office visit was on 07/04/2020 with Dr. Maudie Mercury. Today is a regular follow up visit.  Other allergic rhinitis Perennial rhinitis symptoms for the last 7+ years with worsening the spring and summer.  Tried Claritin and Flonase with some benefit.  Skin testing in 2014 was positive to grass, weed, trees and dust mites.  No prior allergy immunotherapy. Today's skin testing showed: Positive to grass, weed pollen. borderline to mold and dust mites. Start environmental control measures as below. May use over the counter antihistamines such as Xyzal (levocetirizine) 5mg  daily at night as needed. May use Flonase (fluticasone) nasal spray 1 spray per nostril twice a day as needed for nasal congestion. Start Singulair (montelukast) 10mg  daily at night if you notice the above medications are not controlling your symptoms. Cautioned that in some children/adults can experience behavioral changes including hyperactivity, agitation, depression, sleep disturbances and suicidal ideations. These side effects are rare, but if you notice them you should notify me and discontinue Singulair (montelukast). Consider allergy immunotherapy for long-term control in the future - handout given.    Asthma Diagnosed with asthma as a young child and currently using albuterol 2-3 times per week with good benefit.  Main triggers are allergies, strong scents and exertion. Today's spirometry was normal. May use albuterol  rescue inhaler 2 puffs every 4 to 6 hours as needed for shortness of breath, chest tightness, coughing, and wheezing. May use albuterol rescue inhaler 2 puffs 5 to 15 minutes prior to strenuous physical activities. Monitor frequency of use.  Start Singulair 10mg  daily as above.    Urticaria History of hives which are mainly triggered by heat and environmental allergies per patient report.  According to scanned records patient used to be on Xolair injections. Monitor symptoms.    Adverse reaction to food, subsequent encounter Currently avoiding shellfish due to positive skin testing in the past.  Not sure of any known clinical reactions.  Tolerates conditioning without any issues.  She is also lactose intolerant. Today's skin testing showed: Negative to shellfish. Continue to avoid shellfish. If interested in re-introductions will need to get bloodwork first - patient declines at this time.  Removed egg from allergy list as patient tolerates without any issues.  For mild symptoms you can take over the counter antihistamines such as Benadryl and monitor symptoms closely. If symptoms worsen or if you have severe symptoms including breathing issues, throat closure, significant swelling, whole body hives, severe diarrhea and vomiting, lightheadedness then seek immediate medical care.   Return in about 3 months (around 10/03/2020).  Assessment and Plan: Felicia Salinas is a 30 y.o. female with: No problem-specific Assessment & Plan notes found for this encounter.  No follow-ups on file.  No orders of the defined types were placed in this encounter.  Lab Orders  No laboratory test(s) ordered today    Diagnostics: Spirometry:  Tracings reviewed. Her effort: {Blank single:19197::"Good reproducible efforts.","It was hard to get consistent efforts and there is a question as to whether this reflects  a maximal maneuver.","Poor effort, data can not be interpreted."} FVC: ***L FEV1: ***L, ***%  predicted FEV1/FVC ratio: ***% Interpretation: {Blank single:19197::"Spirometry consistent with mild obstructive disease","Spirometry consistent with moderate obstructive disease","Spirometry consistent with severe obstructive disease","Spirometry consistent with possible restrictive disease","Spirometry consistent with mixed obstructive and restrictive disease","Spirometry uninterpretable due to technique","Spirometry consistent with normal pattern","No overt abnormalities noted given today's efforts"}.  Please see scanned spirometry results for details.  Skin Testing: {Blank single:19197::"Select foods","Environmental allergy panel","Environmental allergy panel and select foods","Food allergy panel","None","Deferred due to recent antihistamines use"}. Positive test to: ***. Negative test to: ***.  Results discussed with patient/family.   Medication List:  Current Outpatient Medications  Medication Sig Dispense Refill   albuterol (PROVENTIL) (5 MG/ML) 0.5% nebulizer solution Take 0.5 mLs (2.5 mg total) by nebulization every 6 (six) hours as needed for wheezing or shortness of breath. 20 mL 0   albuterol (VENTOLIN HFA) 108 (90 Base) MCG/ACT inhaler Inhale 2 puffs into the lungs every 4 (four) hours as needed for wheezing or shortness of breath (coughing). 18 g 2   COVID-19 At Home Antigen Test ([facility] COVID-19 HOME TEST) KIT Use as directed 4 each 0   escitalopram (LEXAPRO) 20 MG tablet Take 1 tablet (20 mg total) by mouth daily. 90 tablet 0   fluticasone (FLONASE) 50 MCG/ACT nasal spray Place 1 spray into both nostrils 2 (two) times daily as needed for rhinitis. 16 g 5   Galcanezumab-gnlm (EMGALITY) 120 MG/ML SOAJ Inject 1 pen every 30 days as directed. 1 mL 3   levocetirizine (XYZAL) 5 MG tablet Take 1 tablet (5 mg total) by mouth every evening. 30 tablet 5   montelukast (SINGULAIR) 10 MG tablet Take 1 tablet (10 mg total) by mouth at bedtime. 30 tablet 5   Rimegepant Sulfate (NURTEC) 75 MG  TBDP Take 1 tablet by mouth once as needed. May repeat once after 2 hours.  No more than 2 tablets in 24 hours. 10 tablet 3   No current facility-administered medications for this visit.   Allergies: Allergies  Allergen Reactions   Dairy Aid [Lactase] Anaphylaxis   Other Hives and Anaphylaxis     shrimp, dairy products, shellfish (Swelling) Other reaction(s): Hives   Shellfish Allergy Anaphylaxis   Benadryl [Diphenhydramine Hcl] Hives   Iodine Hives and Rash   Tape Rash   I reviewed her past medical history, social history, family history, and environmental history and no significant changes have been reported from her previous visit.  Review of Systems  Constitutional:  Negative for appetite change, chills, fever and unexpected weight change.  HENT:  Positive for congestion, postnasal drip, rhinorrhea and sneezing.   Eyes:  Negative for itching.  Respiratory:  Negative for cough, chest tightness, shortness of breath and wheezing.   Cardiovascular:  Negative for chest pain.  Gastrointestinal:  Negative for abdominal pain.  Genitourinary:  Negative for difficulty urinating.  Skin:  Negative for rash.  Allergic/Immunologic: Positive for environmental allergies.  Neurological:  Positive for headaches.   Objective: LMP 09/26/2020  There is no height or weight on file to calculate BMI. Physical Exam Vitals and nursing note reviewed.  Constitutional:      Appearance: Normal appearance. She is well-developed. She is obese.  HENT:     Head: Normocephalic and atraumatic.     Right Ear: Tympanic membrane and external ear normal.     Left Ear: Tympanic membrane and external ear normal.     Nose: Nose normal.     Mouth/Throat:  Mouth: Mucous membranes are moist.     Pharynx: Oropharynx is clear.  Eyes:     Conjunctiva/sclera: Conjunctivae normal.  Cardiovascular:     Rate and Rhythm: Normal rate and regular rhythm.     Heart sounds: Normal heart sounds. No murmur heard.   No  friction rub. No gallop.  Pulmonary:     Effort: Pulmonary effort is normal.     Breath sounds: Normal breath sounds. No wheezing, rhonchi or rales.  Musculoskeletal:     Cervical back: Neck supple.  Skin:    General: Skin is warm.     Findings: No rash.  Neurological:     Mental Status: She is alert and oriented to person, place, and time.  Psychiatric:        Behavior: Behavior normal.   Previous notes and tests were reviewed. The plan was reviewed with the patient/family, and all questions/concerned were addressed.  It was my pleasure to see Felicia Salinas today and participate in her care. Please feel free to contact me with any questions or concerns.  Sincerely,  Rexene Alberts, DO Allergy & Immunology  [facility] office: [place] office: [phone]

## 2020-10-19 ENCOUNTER — Other Ambulatory Visit (HOSPITAL_COMMUNITY): Payer: Self-pay

## 2020-10-20 ENCOUNTER — Other Ambulatory Visit (INDEPENDENT_AMBULATORY_CARE_PROVIDER_SITE_OTHER): Payer: Self-pay | Admitting: Primary Care

## 2020-10-20 DIAGNOSIS — R058 Other specified cough: Secondary | ICD-10-CM

## 2020-10-20 MED ORDER — BENZONATATE 100 MG PO CAPS
100.0000 mg | ORAL_CAPSULE | Freq: Two times a day (BID) | ORAL | 0 refills | Status: DC | PRN
  Filled 2020-10-20: qty 20, 10d supply, fill #0

## 2020-10-21 ENCOUNTER — Other Ambulatory Visit (HOSPITAL_COMMUNITY): Payer: Self-pay

## 2020-10-25 ENCOUNTER — Telehealth (INDEPENDENT_AMBULATORY_CARE_PROVIDER_SITE_OTHER): Payer: No Typology Code available for payment source | Admitting: Primary Care

## 2020-10-25 ENCOUNTER — Encounter (INDEPENDENT_AMBULATORY_CARE_PROVIDER_SITE_OTHER): Payer: Self-pay | Admitting: Primary Care

## 2020-10-25 ENCOUNTER — Other Ambulatory Visit (INDEPENDENT_AMBULATORY_CARE_PROVIDER_SITE_OTHER): Payer: Self-pay | Admitting: Pharmacist

## 2020-10-25 ENCOUNTER — Other Ambulatory Visit (HOSPITAL_COMMUNITY): Payer: Self-pay

## 2020-10-25 DIAGNOSIS — F32A Depression, unspecified: Secondary | ICD-10-CM | POA: Diagnosis not present

## 2020-10-25 DIAGNOSIS — F419 Anxiety disorder, unspecified: Secondary | ICD-10-CM | POA: Diagnosis not present

## 2020-10-25 MED ORDER — BUPROPION HCL ER (XL) 150 MG PO TB24
150.0000 mg | ORAL_TABLET | Freq: Every day | ORAL | 0 refills | Status: DC
  Filled 2020-10-25: qty 90, 90d supply, fill #0

## 2020-10-25 NOTE — Progress Notes (Signed)
[place]  Virtual Visit via Telephone Note  I connected with Felicia Salinas, on 10/25/2020 at 8:26 AM  by telephone and verified that I am speaking with the correct person using two identifiers.   Consent: I discussed the limitations, risks, security and privacy concerns of performing an evaluation and management service by telephone and the availability of in person appointments. I also discussed with the patient that there may be a patient responsible charge related to this service. The patient expressed understanding and agreed to proceed.   Location of Patient: home  Location of Provider: [facility]   Persons participating in Telemedicine visit: Corina Berenda Morale,  NP Llana Aliment , CMA  History of Present Illnes Tyronda Linders is a 30 year old female having increase problems with depression and anxiety- obese and low self esteem.  Past Medical History:  Diagnosis Date   Angio-edema    Anxiety    Asthma    controlled per patient last attack in January   Environmental allergies    Obesity    Urticaria    Allergies  Allergen Reactions   Dairy Aid [Lactase] Anaphylaxis   Other Hives and Anaphylaxis     shrimp, dairy products, shellfish (Swelling) Other reaction(s): Hives   Shellfish Allergy Anaphylaxis   Benadryl [Diphenhydramine Hcl] Hives   Iodine Hives and Rash   Tape Rash    Current Outpatient Medications on File Prior to Visit  Medication Sig Dispense Refill   albuterol (PROVENTIL) (5 MG/ML) 0.5% nebulizer solution Take 0.5 mLs (2.5 mg total) by nebulization every 6 (six) hours as needed for wheezing or shortness of breath. 20 mL 0   albuterol (VENTOLIN HFA) 108 (90 Base) MCG/ACT inhaler Inhale 2 puffs into the lungs every 4 (four) hours as needed for wheezing or shortness of breath (coughing). 18 g 2   benzonatate (TESSALON) 100 MG capsule Take 1 capsule (100 mg total) by  mouth 2 (two) times daily as needed for cough. 20 capsule 0   clomiPHENE (CLOMID) 50 MG tablet Take 1 tablet by mouth every day 5 tablet 0   COVID-19 At Home Antigen Test (CARESTART COVID-19 HOME TEST) KIT Use as directed 4 each 0   escitalopram (LEXAPRO) 20 MG tablet Take 1 tablet (20 mg total) by mouth daily. 90 tablet 0   fluticasone (FLONASE) 50 MCG/ACT nasal spray Place 1 spray into both nostrils 2 (two) times daily as needed for rhinitis. 16 g 5   Galcanezumab-gnlm (EMGALITY) 120 MG/ML SOAJ Inject 1 pen every 30 days as directed. 1 mL 3   levocetirizine (XYZAL) 5 MG tablet Take 1 tablet (5 mg total) by mouth every evening. 30 tablet 5   montelukast (SINGULAIR) 10 MG tablet Take 1 tablet (10 mg total) by mouth at bedtime. 30 tablet 5   Rimegepant Sulfate (NURTEC) 75 MG TBDP Take 1 tablet by mouth once as needed. May repeat once after 2 hours.  No more than 2 tablets in 24 hours. 10 tablet 3   No current facility-administered medications on file prior to visit.    Observations/Objective: Pertinent positive in HPI  Assessment and Plan: Diagnoses and all orders for this visit:  Anxiety and depression Explain this medication may have a side affect of weight loss. Do not stop aburtley any . Continue to take Lexapro and f/u with CSW -     buPROPion (WELLBUTRIN XL) 150 MG 24 hr tablet; Take 1 tablet by mouth daily.  Follow Up Instructions:    I discussed the assessment and treatment plan with the patient. The patient was provided an opportunity to ask questions and all were answered. The patient agreed with the plan and demonstrated an understanding of the instructions.   The patient was advised to call back or seek an in-person evaluation if the symptoms worsen or if the condition fails to improve as anticipated.     I provided 15 minutes total of non-face-to-face time during this encounter including median intraservice time, reviewing previous notes, investigations, ordering  medications, medical decision making, coordinating care and patient verbalized understanding at the end of the visit.    This note has been created with Surveyor, quantity. Any transcriptional errors are unintentional.   Kerin Perna, NP 10/25/2020, 8:26 AM

## 2020-10-26 ENCOUNTER — Other Ambulatory Visit (HOSPITAL_COMMUNITY): Payer: Self-pay

## 2020-10-26 MED ORDER — CARESTART COVID-19 HOME TEST VI KIT
PACK | 0 refills | Status: DC
  Filled 2020-10-26: qty 4, 4d supply, fill #0

## 2020-11-07 ENCOUNTER — Ambulatory Visit: Payer: No Typology Code available for payment source | Admitting: Internal Medicine

## 2020-11-07 NOTE — Progress Notes (Deleted)
Name: Felicia Salinas  MRN/ DOB: [id number], [birth year]    Age/ Sex: 30 y.o., female     PCP: Kerin Perna, NP   Reason for Endocrinology Evaluation: PCOS     Initial Endocrinology Clinic Visit: 05/06/2020    PATIENT IDENTIFIER: Felicia Salinas is a 30 y.o., female with a past medical history of Asthma, migraine headaches and OSA on CPAP .She has followed with [facility] since 05/06/2020 for consultative assistance with management of her PCOS.   HISTORICAL SUMMARY: She was diagnosed with PCOS a few years ago through her Gyn Tressia Danas) during evaluation for infetility .     During evaluation for chronic headaches she was noted to have partial empty sella on MRI of the brain dated 06/2019. Pituitary hormonal profile normal 04/2020  She also had normal 24- hr urinary cortisol   Pelvic ultrasound showed evidence of possible adenomyosis, was referred to GYN/Infertility clinic.  No FH of infertility, but has FH DM. No FH of hirsutism  Has sisters and a brother - little sister with DM    SUBJECTIVE:    Today (11/07/2020):  Felicia Salinas is here for PCOS.     HISTORY:  Past Medical History:  Past Medical History:  Diagnosis Date   Angio-edema    Anxiety    Asthma    controlled per patient last attack in January   Environmental allergies    Obesity    Urticaria    Past Surgical History:  Past Surgical History:  Procedure Laterality Date   APPENDECTOMY  04/07/2011   CHOLECYSTECTOMY  10/18/2011   Procedure: LAPAROSCOPIC CHOLECYSTECTOMY WITH INTRAOPERATIVE CHOLANGIOGRAM;  Surgeon: Rolm Bookbinder, MD;  Location: [facility];  Service: General;  Laterality: N/A;   IR ANGIO INTRA EXTRACRAN SEL COM CAROTID INNOMINATE UNI R MOD SED  06/02/2019   IR ANGIO INTRA EXTRACRAN SEL INTERNAL CAROTID UNI L MOD SED  06/02/2019   IR ANGIO VERTEBRAL SEL VERTEBRAL UNI R MOD SED  06/02/2019   IR US GUIDE VASC ACCESS RIGHT  06/02/2019   TONSILLECTOMY     WISDOM TOOTH EXTRACTION      Social History:  reports that she has never smoked. She has never used smokeless tobacco. She reports that she does not currently use alcohol. She reports that she does not use drugs. Family History:  Family History  Problem Relation Age of Onset   Cancer Mother        breast and spinal CA   Asthma Mother    Healthy Father    Asthma Father    Breast cancer Maternal Grandmother    Diabetes Maternal Grandmother    Rheum arthritis Maternal Grandmother    Ovarian cancer Paternal Aunt    Ovarian cancer Maternal Aunt    Diabetes Sister    Diabetes Paternal Grandfather    Hypertension Maternal Grandfather    Colon cancer Neg Hx      HOME MEDICATIONS: Allergies as of 11/07/2020       Reactions   Dairy Aid [lactase] Anaphylaxis   Other Hives, Anaphylaxis    shrimp, dairy products, shellfish (Swelling) Other reaction(s): Hives   Shellfish Allergy Anaphylaxis   Benadryl [diphenhydramine Hcl] Hives   Iodine Hives, Rash   Tape Rash        Medication List        Accurate as of November 07, 2020  7:08 AM. If you have any questions, ask your nurse or doctor.          albuterol (  5 MG/ML) 0.5% nebulizer solution Commonly known as: PROVENTIL Take 0.5 mLs (2.5 mg total) by nebulization every 6 (six) hours as needed for wheezing or shortness of breath.   albuterol 108 (90 Base) MCG/ACT inhaler Commonly known as: VENTOLIN HFA Inhale 2 puffs into the lungs every 4 (four) hours as needed for wheezing or shortness of breath (coughing).   benzonatate 100 MG capsule Commonly known as: TESSALON Take 1 capsule (100 mg total) by mouth 2 (two) times daily as needed for cough.   buPROPion 150 MG 24 hr tablet Commonly known as: Wellbutrin XL Take 1 tablet by mouth daily.   Carestart COVID-19 Home Test Kit Generic drug: COVID-19 At Home Antigen Test Use as directed within package instructions.   clomiPHENE 50 MG tablet Commonly known as: CLOMID Take 1 tablet by mouth every day    Emgality 120 MG/ML Soaj Generic drug: Galcanezumab-gnlm Inject 1 pen every 30 days as directed.   escitalopram 20 MG tablet Commonly known as: Lexapro Take 1 tablet (20 mg total) by mouth daily.   fluticasone 50 MCG/ACT nasal spray Commonly known as: Flonase Place 1 spray into both nostrils 2 (two) times daily as needed for rhinitis.   levocetirizine 5 MG tablet Commonly known as: Xyzal Take 1 tablet (5 mg total) by mouth every evening.   montelukast 10 MG tablet Commonly known as: Singulair Take 1 tablet (10 mg total) by mouth at bedtime.   Nurtec 75 MG Tbdp Generic drug: Rimegepant Sulfate Take 1 tablet by mouth once as needed. May repeat once after 2 hours.  No more than 2 tablets in 24 hours.          OBJECTIVE:   PHYSICAL EXAM: VS: There were no vitals taken for this visit.   EXAM: General: Pt appears well and is in NAD  Hydration: Well-hydrated with moist mucous membranes and good skin turgor  Eyes: External eye exam normal without stare, lid lag or exophthalmos.  EOM intact.  PERRL.  Ears, Nose, Throat: Hearing: Grossly intact bilaterally Dental: Good dentition  Throat: Clear without mass, erythema or exudate  Neck: General: Supple without adenopathy. Thyroid: Thyroid size normal.  No goiter or nodules appreciated. No thyroid bruit.  Lungs: Clear with good BS bilat with no rales, rhonchi, or wheezes  Heart: Auscultation: RRR.  Abdomen: Normoactive bowel sounds, soft, nontender, without masses or organomegaly palpable  Extremities: Gait and station: Normal gait  Digits and nails: No clubbing, cyanosis, petechiae, or nodes Head and neck: Normal alignment and mobility BL UE: Normal ROM and strength. BL LE: No pretibial edema normal ROM and strength.  Skin: Hair: Texture and amount normal with gender appropriate distribution Skin Inspection: No rashes, acanthosis nigricans/skin tags. No lipohypertrophy Skin Palpation: Skin temperature, texture, and thickness  normal to palpation  Neuro: Cranial nerves: II - XII grossly intact  Cerebellar: Normal coordination and movement; no tremor Motor: Normal strength throughout DTRs: 2+ and symmetric in UE without delay in relaxation phase  Mental Status: Judgment, insight: Intact Orientation: Oriented to time, place, and person Memory: Intact for recent and remote events Mood and affect: No depression, anxiety, or agitation     DATA REVIEWED: ***    ASSESSMENT / PLAN / RECOMMENDATIONS:   PCOS:    - Per pt she has been diagnosed with PCOS but I explained to her that I personally can not make this diagnosis at this time.  Women who have PCOS has to meet at least 2 of 3 [place] criteria (oligo or  amenorrhea, hyperandrogenism, polycystic ovaries per [place]. ) But at this time she only has hirsutism. Testosterone is normal, menstruations have been regular and I am going to proceed with pelvic ultrasound.    2. Hirsutism :     - This is idiopathic and localized to the chin area - Testosterone is normal.  - We discussed first line of treatment is Combined OCP followed by Spironolactone which is teratogenic and since she is interested in conception, this is not a good option at this time     2. Partial Empty Sella:    - No clinical evidence of hypopituitarism, will proceed with pituitary hormone check up  - Most likely a normal variant       Signed electronically by: Mack Guise, MD  [facility]  [facility] [address], [place], [place] [address] Phone: [phone] FAX: [phone]      CC: Kerin Perna, NP [address] [place] [address] Phone: [phone]  Fax: [phone]   Return to Endocrinology clinic as below: Future Appointments  Date Time Provider [place]  11/07/2020  7:30 AM Tishina Lown, Melanie Crazier, MD [facility] None  01/12/2021  9:00 AM Debbora Presto, NP [facility] None

## 2020-11-09 ENCOUNTER — Other Ambulatory Visit (HOSPITAL_COMMUNITY): Payer: Self-pay

## 2020-11-13 ENCOUNTER — Encounter: Payer: Self-pay | Admitting: Neurology

## 2020-11-29 ENCOUNTER — Encounter (INDEPENDENT_AMBULATORY_CARE_PROVIDER_SITE_OTHER): Payer: Self-pay | Admitting: Primary Care

## 2020-11-29 DIAGNOSIS — E669 Obesity, unspecified: Secondary | ICD-10-CM

## 2020-12-02 ENCOUNTER — Other Ambulatory Visit (HOSPITAL_COMMUNITY): Payer: Self-pay

## 2020-12-03 ENCOUNTER — Other Ambulatory Visit (HOSPITAL_COMMUNITY): Payer: Self-pay

## 2020-12-05 ENCOUNTER — Ambulatory Visit: Payer: No Typology Code available for payment source | Admitting: Adult Health

## 2020-12-24 ENCOUNTER — Other Ambulatory Visit (INDEPENDENT_AMBULATORY_CARE_PROVIDER_SITE_OTHER): Payer: Self-pay | Admitting: Primary Care

## 2020-12-24 MED ORDER — SAXENDA 18 MG/3ML ~~LOC~~ SOPN
3.0000 mg | PEN_INJECTOR | Freq: Every day | SUBCUTANEOUS | 3 refills | Status: DC
  Filled 2020-12-24: qty 3, 6d supply, fill #0

## 2020-12-26 ENCOUNTER — Other Ambulatory Visit (INDEPENDENT_AMBULATORY_CARE_PROVIDER_SITE_OTHER): Payer: Self-pay | Admitting: Primary Care

## 2020-12-26 ENCOUNTER — Other Ambulatory Visit (HOSPITAL_COMMUNITY): Payer: Self-pay

## 2020-12-26 DIAGNOSIS — E669 Obesity, unspecified: Secondary | ICD-10-CM

## 2020-12-26 MED ORDER — SAXENDA 18 MG/3ML ~~LOC~~ SOPN
3.0000 mg | PEN_INJECTOR | Freq: Every day | SUBCUTANEOUS | 3 refills | Status: DC
  Filled 2020-12-26 – 2021-01-02 (×3): qty 15, 30d supply, fill #0
  Filled 2021-02-09: qty 15, 30d supply, fill #1
  Filled 2021-03-12: qty 15, 30d supply, fill #2
  Filled 2021-04-17: qty 15, 30d supply, fill #3

## 2020-12-26 MED ORDER — SAXENDA 18 MG/3ML ~~LOC~~ SOPN
3.0000 mg | PEN_INJECTOR | Freq: Every day | SUBCUTANEOUS | 3 refills | Status: DC
  Filled 2020-12-26: qty 3, 30d supply, fill #0

## 2020-12-27 ENCOUNTER — Other Ambulatory Visit: Payer: Self-pay

## 2020-12-27 ENCOUNTER — Other Ambulatory Visit (HOSPITAL_COMMUNITY): Payer: Self-pay

## 2020-12-27 ENCOUNTER — Encounter: Payer: Self-pay | Admitting: Primary Care

## 2020-12-27 ENCOUNTER — Telehealth: Payer: Self-pay

## 2020-12-27 ENCOUNTER — Ambulatory Visit (INDEPENDENT_AMBULATORY_CARE_PROVIDER_SITE_OTHER): Payer: No Typology Code available for payment source | Admitting: Primary Care

## 2020-12-27 DIAGNOSIS — G4733 Obstructive sleep apnea (adult) (pediatric): Secondary | ICD-10-CM

## 2020-12-27 DIAGNOSIS — J4520 Mild intermittent asthma, uncomplicated: Secondary | ICD-10-CM

## 2020-12-27 DIAGNOSIS — Z23 Encounter for immunization: Secondary | ICD-10-CM | POA: Diagnosis not present

## 2020-12-27 DIAGNOSIS — Z6841 Body Mass Index (BMI) 40.0 and over, adult: Secondary | ICD-10-CM | POA: Diagnosis not present

## 2020-12-27 DIAGNOSIS — J3089 Other allergic rhinitis: Secondary | ICD-10-CM

## 2020-12-27 DIAGNOSIS — G43711 Chronic migraine without aura, intractable, with status migrainosus: Secondary | ICD-10-CM | POA: Diagnosis not present

## 2020-12-27 DIAGNOSIS — F32A Depression, unspecified: Secondary | ICD-10-CM

## 2020-12-27 DIAGNOSIS — F419 Anxiety disorder, unspecified: Secondary | ICD-10-CM

## 2020-12-27 DIAGNOSIS — E282 Polycystic ovarian syndrome: Secondary | ICD-10-CM

## 2020-12-27 DIAGNOSIS — N979 Female infertility, unspecified: Secondary | ICD-10-CM

## 2020-12-27 LAB — COMPREHENSIVE METABOLIC PANEL
ALT: 28 U/L (ref 0–35)
AST: 24 U/L (ref 0–37)
Albumin: 4 g/dL (ref 3.5–5.2)
Alkaline Phosphatase: 49 U/L (ref 39–117)
BUN: 11 mg/dL (ref 6–23)
CO2: 27 mEq/L (ref 19–32)
Calcium: 9.4 mg/dL (ref 8.4–10.5)
Chloride: 100 mEq/L (ref 96–112)
Creatinine, Ser: 0.79 mg/dL (ref 0.40–1.20)
GFR: 98.93 mL/min (ref >60.00)
Glucose, Bld: 70 mg/dL (ref 70–99)
Potassium: 4.2 mEq/L (ref 3.5–5.1)
Sodium: 136 mEq/L (ref 135–145)
Total Bilirubin: 0.3 mg/dL (ref 0.2–1.2)
Total Protein: 6.9 g/dL (ref 6.0–8.3)

## 2020-12-27 LAB — CBC
HCT: 35.4 % — ABNORMAL LOW (ref 36.0–46.0)
Hemoglobin: 11.5 g/dL — ABNORMAL LOW (ref 12.0–15.0)
MCHC: 32.4 g/dL (ref 30.0–36.0)
MCV: 87 fl (ref 78.0–100.0)
Platelets: 346 10*3/uL (ref 150.0–400.0)
RBC: 4.06 Mil/uL (ref 3.87–5.11)
RDW: 14.9 % (ref 11.5–15.5)
WBC: 6.9 10*3/uL (ref 4.0–10.5)

## 2020-12-27 LAB — HEMOGLOBIN A1C: Hgb A1c MFr Bld: 5.7 % (ref 4.6–6.5)

## 2020-12-27 MED ORDER — SERTRALINE HCL 50 MG PO TABS
50.0000 mg | ORAL_TABLET | Freq: Every day | ORAL | 0 refills | Status: DC
  Filled 2020-12-27: qty 90, 90d supply, fill #0

## 2020-12-27 NOTE — Assessment & Plan Note (Signed)
Overall seems mild, infrequent use of albuterol inhaler.  No wheezing on exam.  Continue Singulair 10 mg, Xyzal 5 mg, albuterol PRN.

## 2020-12-27 NOTE — Telephone Encounter (Signed)
Prior auth submitted for saxenda on 12/27/2020 key [id number]

## 2020-12-27 NOTE — Assessment & Plan Note (Signed)
Following with neurology.  Continue once monthly Emgality injections and PRN Nurtec.

## 2020-12-27 NOTE — Assessment & Plan Note (Signed)
Following with allergy and asthma. Continue Singulair 10 mg and Xyzal 5 mg.

## 2020-12-27 NOTE — Assessment & Plan Note (Signed)
No longer with CPAP machine, is working to obtain.

## 2020-12-27 NOTE — Assessment & Plan Note (Signed)
Long discussion today regarding the need to improve her diet. Discussed to stop sugary drinks, fast foods.  Agree to continue with Saxenda, we will work on prior authorization. We will also plan to meet frequently for follow up.   Discussed instructions for use.

## 2020-12-27 NOTE — Assessment & Plan Note (Signed)
Uncontrolled despite Lexapro 20 mg and Wellbutrin XL 150 mg.  Stop Lexapro 20 mg.  Start Zolfot 50 mg.  Remain off Wellbutrin XL 150 mg.  Follow up in 6 weeks.

## 2020-12-27 NOTE — Assessment & Plan Note (Signed)
Following with GYN.  Labs from endocrinology reviewed from February 2022.

## 2020-12-27 NOTE — Assessment & Plan Note (Signed)
Following with GYN.  Labs from endocrinology reviewed from February 2022. 

## 2020-12-27 NOTE — Progress Notes (Signed)
Subjective:    Patient ID: Felicia Salinas, female    DOB: May 12, 1990, 30 y.o.   MRN: [id number]  HPI  Felicia Salinas is a very pleasant 30 y.o. female who presents today who presents today to establish care and discuss the problems mentioned below. Will obtain/review records.  1) Migraines: Chronic. Follows with Neurology and is managed on Emgality injections every 30 days. Also managed on Nurtec PRN for migraine abortion.  Migraines occur ten times monthly, improved from daily before she was on Emgality. She tries to reserve Nurtec for severe migraines.   2) Asthma/OSA: Currently managed on Singulair 10 mg, Xyzal 5 mg, Flonase, albuterol inhaler and nebulized treatments PRN. Following with allergist.   She uses her albuterol only when needed with moderate exertion or exercise.   Diagnosed about 1 year ago with OSA, is working on getting a new machine, has been without for a few months. Has noticed a difference without her CPAP.   3) Anxiety and Depression: Currently managed on Lexapro 20 mg, bupropion XL 150 mg. She doesn't feel well managed on her current regimen. Bupropion was added about 6 weeks ago, took it for about 2-3 weeks, didn't notice improvement so she stopped.   Symptoms include feeling down, thinking worst case scenario, little motivation, feeling nervous, daily worry.   PHQ9 SCORE ONLY 12/27/2020 09/05/2020 04/04/2020  PHQ-9 Total Score 11 13 0   GAD 7 : Generalized Anxiety Score 12/27/2020 09/05/2020 10/16/2019 10/16/2019  Nervous, Anxious, on Edge 1 2 3  0  Control/stop worrying 2 3 3  0  Worry too much - different things 2 3 3  0  Trouble relaxing 2 3 3  0  Restless 1 2 2  0  Easily annoyed or irritable 3 3 3  0  Afraid - awful might happen 1 3 3  0  Total GAD 7 Score 12 19 20  0  Anxiety Difficulty Somewhat difficult Very difficult Very difficult Not difficult at all      4) Obesity: Recently prescribed Saxenda for obesity, but a prior authorization was requested.  Long  history of obesity since middle school, has yo-yo weight loss and gain effects. She's tired watching portion sizes, eating before 8 pm. She would like to   Diet currently consists of:  Breakfast: Protein shake, yogurt, english muffin from fast food Lunch: Skips, fast food, left overs Dinner: Skips, protein, vegetables, little starch Snacks: Chips, graham crackers and peanut butter Desserts: Several times weekly, ice cream sandwiches Beverages: Water, iced coffee, Ginger Ale, Sprite  Exercise: She is not exercising much, walks some around her home.    BP Readings from Last 3 Encounters:  12/27/20 120/82  10/03/20 104/72  09/29/20 119/81   Wt Readings from Last 3 Encounters:  12/27/20 (!) 304 lb (137.9 kg)  10/03/20 (!) 309 lb (140.2 kg)  09/29/20 (!) 309 lb (140.2 kg)     Review of Systems  Respiratory:  Negative for shortness of breath.   Cardiovascular:  Negative for chest pain.  Neurological:  Positive for headaches.  Psychiatric/Behavioral:  The patient is nervous/anxious.        See HPI        Past Medical History:  Diagnosis Date   Angio-edema    Anxiety    Asthma    controlled per patient last attack in January   Environmental allergies    Obesity    Urticaria     Social History   Socioeconomic History   Marital status: Married    Spouse name: Not on  file   Number of children: 0   Years of education: Not on file   Highest education level: Not on file  Occupational History   Occupation: representative    Employer: [employer]  Tobacco Use   Smoking status: Never   Smokeless tobacco: Never  Vaping Use   Vaping Use: Never used  Substance and Sexual Activity   Alcohol use: Not Currently    Comment: Occasional   Drug use: No   Sexual activity: Yes    Birth control/protection: None  Other Topics Concern   Not on file  Social History Narrative   Lives at home with husband and cat   Left handed   Caffeine: frappe occasionally    Social Determinants of  Health   Financial Resource Strain: Not on file  Food Insecurity: Not on file  Transportation Needs: Not on file  Physical Activity: Not on file  Stress: Not on file  Social Connections: Not on file  Intimate Partner Violence: Not on file    Past Surgical History:  Procedure Laterality Date   APPENDECTOMY  04/07/2011   CHOLECYSTECTOMY  10/18/2011   Procedure: LAPAROSCOPIC CHOLECYSTECTOMY WITH INTRAOPERATIVE CHOLANGIOGRAM;  Surgeon: Emelia Loron, MD;  Location: [facility];  Service: General;  Laterality: N/A;   IR ANGIO INTRA EXTRACRAN SEL COM CAROTID INNOMINATE UNI R MOD SED  06/02/2019   IR ANGIO INTRA EXTRACRAN SEL INTERNAL CAROTID UNI L MOD SED  06/02/2019   IR ANGIO VERTEBRAL SEL VERTEBRAL UNI R MOD SED  06/02/2019   IR US GUIDE VASC ACCESS RIGHT  06/02/2019   TONSILLECTOMY     WISDOM TOOTH EXTRACTION      Family History  Problem Relation Age of Onset   Cancer Mother        breast and spinal CA   Asthma Mother    Healthy Father    Asthma Father    Breast cancer Maternal Grandmother    Diabetes Maternal Grandmother    Rheum arthritis Maternal Grandmother    Ovarian cancer Paternal Aunt    Ovarian cancer Maternal Aunt    Diabetes Sister    Diabetes Paternal Grandfather    Hypertension Maternal Grandfather    Colon cancer Neg Hx     Allergies  Allergen Reactions   Dairy Aid [Lactase] Anaphylaxis   Other Hives and Anaphylaxis     shrimp, dairy products, shellfish (Swelling) Other reaction(s): Hives   Shellfish Allergy Anaphylaxis   Benadryl [Diphenhydramine Hcl] Hives   Iodine Hives and Rash   Tape Rash    Current Outpatient Medications on File Prior to Visit  Medication Sig Dispense Refill   albuterol (VENTOLIN HFA) 108 (90 Base) MCG/ACT inhaler Inhale 2 puffs into the lungs every 4 (four) hours as needed for wheezing or shortness of breath (coughing). 18 g 2   escitalopram (LEXAPRO) 20 MG tablet Take 1 tablet (20 mg total) by mouth daily. 90 tablet 0   fluticasone  (FLONASE) 50 MCG/ACT nasal spray Place 1 spray into both nostrils 2 (two) times daily as needed for rhinitis. 16 g 5   Galcanezumab-gnlm (EMGALITY) 120 MG/ML SOAJ Inject 1 pen every 30 days as directed. 1 mL 3   levocetirizine (XYZAL) 5 MG tablet Take 1 tablet (5 mg total) by mouth every evening. 30 tablet 5   montelukast (SINGULAIR) 10 MG tablet Take 1 tablet (10 mg total) by mouth at bedtime. 30 tablet 5   Rimegepant Sulfate (NURTEC) 75 MG TBDP Take 1 tablet by mouth once as needed. May  repeat once after 2 hours.  No more than 2 tablets in 24 hours. 10 tablet 3   albuterol (PROVENTIL) (5 MG/ML) 0.5% nebulizer solution Take 0.5 mLs (2.5 mg total) by nebulization every 6 (six) hours as needed for wheezing or shortness of breath. (Patient not taking: Reported on 12/27/2020) 20 mL 0   buPROPion (WELLBUTRIN XL) 150 MG 24 hr tablet Take 1 tablet by mouth daily. (Patient not taking: Reported on 12/27/2020) 90 tablet 0   clomiPHENE (CLOMID) 50 MG tablet Take 1 tablet by mouth every day (Patient not taking: Reported on 12/27/2020) 5 tablet 0   Liraglutide -Weight Management (SAXENDA) 18 MG/3ML SOPN Start with injecting 0.6 mg daily for 1 week, 1.2 mg daily for the next week, 1.8 mg daily for next week. If tolerated, can further increase to 2.4 mg daily. Max dose: 3 mg/daily (Patient not taking: Reported on 12/27/2020) 15 mL 3   No current facility-administered medications on file prior to visit.    BP 120/82   Pulse 92   Temp 97.7 F (36.5 C) (Temporal)   Ht 5' 5.5" (1.664 m)   Wt (!) 304 lb (137.9 kg)   SpO2 98%   BMI 49.82 kg/m  Objective:   Physical Exam Cardiovascular:     Rate and Rhythm: Normal rate and regular rhythm.  Pulmonary:     Effort: Pulmonary effort is normal.     Breath sounds: Normal breath sounds.  Musculoskeletal:     Cervical back: Neck supple.  Skin:    General: Skin is warm and dry.  Psychiatric:        Mood and Affect: Mood normal.          Assessment & Plan:       This visit occurred during the SARS-CoV-2 public health emergency.  Safety protocols were in place, including screening questions prior to the visit, additional usage of staff PPE, and extensive cleaning of exam room while observing appropriate contact time as indicated for disinfecting solutions.

## 2020-12-27 NOTE — Patient Instructions (Addendum)
Stop by the lab prior to leaving today. I will notify you of your results once received.   Stop Lexapro 20 mg for anxiety and depression.  Start sertraline (Zoloft) 50 mg once daily for anxiety and depression.  Remain off of bupropion (Wellbutrin).  We will work on the authorization for Saxenda.   Please schedule a follow up visit for 6 weeks.  It was a pleasure to see you today!

## 2021-01-02 ENCOUNTER — Other Ambulatory Visit (HOSPITAL_COMMUNITY): Payer: Self-pay

## 2021-01-02 MED ORDER — UNIFINE PENTIPS 31G X 6 MM MISC
1 refills | Status: DC
  Filled 2021-01-02: qty 100, 90d supply, fill #0
  Filled 2021-04-17: qty 100, 90d supply, fill #1

## 2021-01-12 ENCOUNTER — Ambulatory Visit: Payer: No Typology Code available for payment source | Admitting: Family Medicine

## 2021-01-16 ENCOUNTER — Other Ambulatory Visit (HOSPITAL_COMMUNITY): Payer: Self-pay

## 2021-01-17 ENCOUNTER — Other Ambulatory Visit: Payer: Self-pay | Admitting: Neurology

## 2021-01-17 ENCOUNTER — Other Ambulatory Visit (HOSPITAL_COMMUNITY): Payer: Self-pay

## 2021-01-18 ENCOUNTER — Other Ambulatory Visit (HOSPITAL_COMMUNITY): Payer: Self-pay

## 2021-01-18 MED ORDER — EMGALITY 120 MG/ML ~~LOC~~ SOAJ
1.0000 mL | SUBCUTANEOUS | 8 refills | Status: DC
  Filled 2021-01-18 – 2021-01-20 (×2): qty 1, 30d supply, fill #0
  Filled 2021-02-22: qty 1, 30d supply, fill #1
  Filled 2021-03-30: qty 1, 30d supply, fill #2
  Filled 2021-04-25: qty 1, 30d supply, fill #3
  Filled 2021-05-28: qty 1, 30d supply, fill #4
  Filled 2021-06-26: qty 1, 30d supply, fill #5

## 2021-01-19 ENCOUNTER — Encounter: Payer: Self-pay | Admitting: Primary Care

## 2021-01-20 ENCOUNTER — Other Ambulatory Visit (HOSPITAL_COMMUNITY): Payer: Self-pay

## 2021-01-24 ENCOUNTER — Other Ambulatory Visit (HOSPITAL_COMMUNITY): Payer: Self-pay

## 2021-01-24 ENCOUNTER — Telehealth: Payer: Self-pay | Admitting: *Deleted

## 2021-01-24 NOTE — Telephone Encounter (Signed)
Felicia Salinas Key: [id number] - PA Case ID: [id number]  PA for Emgality sent this am waiting on approval

## 2021-01-25 ENCOUNTER — Ambulatory Visit (INDEPENDENT_AMBULATORY_CARE_PROVIDER_SITE_OTHER): Payer: No Typology Code available for payment source | Admitting: Primary Care

## 2021-01-25 ENCOUNTER — Encounter: Payer: Self-pay | Admitting: Primary Care

## 2021-01-25 ENCOUNTER — Other Ambulatory Visit (HOSPITAL_COMMUNITY): Payer: Self-pay

## 2021-01-25 DIAGNOSIS — G4700 Insomnia, unspecified: Secondary | ICD-10-CM

## 2021-01-25 NOTE — Telephone Encounter (Signed)
Approvedon November 1 The request has been approved. The authorization is effective for a maximum of 12 fills from 01/24/2021 to 01/23/2022, as long as the member is enrolled in their current health plan. The request was approved with a quantity restriction. This has been approved for a quantity limit of 1 with a day supply limit of 30. A written notification letter will follow with additional details.  Will contact patient through mychart to make her aware

## 2021-01-25 NOTE — Progress Notes (Signed)
Subjective:    Patient ID: Felicia Salinas, female    DOB: [birth year], 30 y.o.   MRN: [id number]  HPI  Felicia Salinas is a very pleasant 30 y.o. female with a history of OSA, chronic migraines, insomnia who presents today to discuss insomnia.  Chronic and intermittent for the last several years, returned about 3 months ago. Difficulty falling and staying asleep. Will lay in bed 30-45 minutes before she falls asleep for the first time. She will wake one hour later with "the best energy", but also yawns, cannot fall back asleep at all sometimes. Bedtime is around 10 pm, wakes at midnight, may or may not fallback asleep. At the most, will get inconsistent sleep of 2-3 hours nightly.   She will watch TV or read a book prior to bedtime. Currently managed on CPAP but has been without for the last 2-3 months as she had to switch CPAP companies for insurance purposes. Her neurologist is aware that she does not have her CPAP machine. She has daytime drowsiness and could fall asleep at work or when she is in traffic.   When she goes to bed she feels tired. Denies mind racing thoughts. She avoids caffeine. She's tried Melatonin which was ineffective. She was once managed on Seroquel 100 mg per prior PCP, it was effective but caused second day drowsiness.    Wt Readings from Last 3 Encounters:  01/25/21 296 lb 9.6 oz (134.5 kg)  12/27/20 (!) 304 lb (137.9 kg)  10/03/20 (!) 309 lb (140.2 kg)     Review of Systems  Constitutional:  Positive for fatigue.  Psychiatric/Behavioral:  Positive for sleep disturbance. The patient is not nervous/anxious.         Past Medical History:  Diagnosis Date   Angio-edema    Anxiety    Asthma    controlled per patient last attack in January   Environmental allergies    Obesity    Urticaria     Social History   Socioeconomic History   Marital status: Married    Spouse name: Not on file   Number of children: 0   Years of education: Not on file   Highest  education level: Not on file  Occupational History   Occupation: representative    Employer: [employer]  Tobacco Use   Smoking status: Never   Smokeless tobacco: Never  Vaping Use   Vaping Use: Never used  Substance and Sexual Activity   Alcohol use: Not Currently    Comment: Occasional   Drug use: No   Sexual activity: Yes    Birth control/protection: None  Other Topics Concern   Not on file  Social History Narrative   Lives at home with husband and cat   Left handed   Caffeine: frappe occasionally    Social Determinants of Health   Financial Resource Strain: Not on file  Food Insecurity: Not on file  Transportation Needs: Not on file  Physical Activity: Not on file  Stress: Not on file  Social Connections: Not on file  Intimate Partner Violence: Not on file    Past Surgical History:  Procedure Laterality Date   APPENDECTOMY  04/07/2011   CHOLECYSTECTOMY  10/18/2011   Procedure: LAPAROSCOPIC CHOLECYSTECTOMY WITH INTRAOPERATIVE CHOLANGIOGRAM;  Surgeon: Emelia Loron, MD;  Location: [facility] OR;  Service: General;  Laterality: N/A;   IR ANGIO INTRA EXTRACRAN SEL COM CAROTID INNOMINATE UNI R MOD SED  06/02/2019   IR ANGIO INTRA EXTRACRAN SEL INTERNAL CAROTID UNI L MOD SED  06/02/2019   IR ANGIO VERTEBRAL SEL VERTEBRAL UNI R MOD SED  06/02/2019   IR US GUIDE VASC ACCESS RIGHT  06/02/2019   TONSILLECTOMY     WISDOM TOOTH EXTRACTION      Family History  Problem Relation Age of Onset   Breast cancer Mother        mets to spine   Asthma Mother    Asthma Father    Diabetes type II Sister    Breast cancer Maternal Grandmother    Diabetes Maternal Grandmother    Rheum arthritis Maternal Grandmother    Hypertension Maternal Grandfather    Diabetes Paternal Grandfather    Ovarian cancer Maternal Aunt    Ovarian cancer Paternal Aunt    Colon cancer Neg Hx     Allergies  Allergen Reactions   Dairy Aid [Tilactase] Anaphylaxis   Other Hives and Anaphylaxis     shrimp, dairy products,  shellfish (Swelling) Other reaction(s): Hives   Shellfish Allergy Anaphylaxis   Benadryl [Diphenhydramine Hcl] Hives   Iodine Hives and Rash   Tape Rash    Current Outpatient Medications on File Prior to Visit  Medication Sig Dispense Refill   albuterol (PROVENTIL) (5 MG/ML) 0.5% nebulizer solution Take 0.5 mLs (2.5 mg total) by nebulization every 6 (six) hours as needed for wheezing or shortness of breath. (Patient not taking: Reported on 12/27/2020) 20 mL 0   albuterol (VENTOLIN HFA) 108 (90 Base) MCG/ACT inhaler Inhale 2 puffs into the lungs every 4 (four) hours as needed for wheezing or shortness of breath (coughing). 18 g 2   clomiPHENE (CLOMID) 50 MG tablet Take 1 tablet by mouth every day (Patient not taking: Reported on 12/27/2020) 5 tablet 0   fluticasone (FLONASE) 50 MCG/ACT nasal spray Place 1 spray into both nostrils 2 (two) times daily as needed for rhinitis. 16 g 5   Galcanezumab-gnlm (EMGALITY) 120 MG/ML SOAJ Inject 1 mL into the skin every 30 (thirty) days. 1 mL 8   Insulin Pen Needle (UNIFINE PENTIPS) 31G X 6 MM MISC Use as directed with Saxenda 100 each 1   levocetirizine (XYZAL) 5 MG tablet Take 1 tablet (5 mg total) by mouth every evening. 30 tablet 5   Liraglutide -Weight Management (SAXENDA) 18 MG/3ML SOPN Start with injecting 0.6 mg daily for 1 week, 1.2 mg daily for the next week, 1.8 mg daily for next week. If tolerated, can further increase to 2.4 mg daily. Max dose: 3 mg/daily (Patient not taking: Reported on 12/27/2020) 15 mL 3   montelukast (SINGULAIR) 10 MG tablet Take 1 tablet (10 mg total) by mouth at bedtime. 30 tablet 5   Rimegepant Sulfate (NURTEC) 75 MG TBDP Dissolve 1 tablet by mouth once as needed. May repeat once after 2 hours.  No more than 2 tablets in 24 hours. 10 tablet 3   sertraline (ZOLOFT) 50 MG tablet Take 1 tablet by mouth daily for anxiety and depression 90 tablet 0   No current facility-administered medications on file prior to visit.    BP  110/80 (BP Location: Left Arm, Patient Position: Sitting, Cuff Size: Large)   Pulse 89   Temp 97.8 F (36.6 C) (Temporal)   Ht 5' 5.5" (1.664 m)   Wt 296 lb 9.6 oz (134.5 kg)   SpO2 99%   BMI 48.61 kg/m  Objective:   Physical Exam Cardiovascular:     Rate and Rhythm: Normal rate and regular rhythm.  Pulmonary:     Effort: Pulmonary effort is normal.  Breath sounds: Normal breath sounds.  Musculoskeletal:     Cervical back: Neck supple.  Skin:    General: Skin is warm and dry.  Psychiatric:        Mood and Affect: Mood normal.          Assessment & Plan:      This visit occurred during the SARS-CoV-2 public health emergency.  Safety protocols were in place, including screening questions prior to the visit, additional usage of staff PPE, and extensive cleaning of exam room while observing appropriate contact time as indicated for disinfecting solutions.

## 2021-01-25 NOTE — Telephone Encounter (Signed)
Good morning! Please send this to treating provider as I do not prescribe this for patient.

## 2021-01-25 NOTE — Assessment & Plan Note (Signed)
Uncontrolled, likely grossly secondary to OSA, has been without CPAP for months.  Strongly advised she contact her neurologist and CPAP company.   I will reach out to her neurologist regarding her symptoms today and to update regarding CPAP status.   Discussed healthy bedtime regimen.

## 2021-02-07 ENCOUNTER — Ambulatory Visit: Payer: No Typology Code available for payment source | Admitting: Primary Care

## 2021-02-09 ENCOUNTER — Other Ambulatory Visit (HOSPITAL_COMMUNITY): Payer: Self-pay

## 2021-02-22 ENCOUNTER — Other Ambulatory Visit (HOSPITAL_COMMUNITY): Payer: Self-pay

## 2021-02-28 ENCOUNTER — Other Ambulatory Visit (HOSPITAL_COMMUNITY): Payer: Self-pay

## 2021-02-28 MED ORDER — CLOMIPHENE CITRATE 50 MG PO TABS
50.0000 mg | ORAL_TABLET | Freq: Every day | ORAL | 0 refills | Status: DC
  Filled 2021-02-28: qty 5, 5d supply, fill #0

## 2021-03-09 ENCOUNTER — Ambulatory Visit: Payer: No Typology Code available for payment source | Admitting: Primary Care

## 2021-03-13 ENCOUNTER — Other Ambulatory Visit (HOSPITAL_COMMUNITY): Payer: Self-pay

## 2021-03-14 ENCOUNTER — Other Ambulatory Visit (HOSPITAL_COMMUNITY): Payer: Self-pay

## 2021-03-14 ENCOUNTER — Other Ambulatory Visit: Payer: Self-pay

## 2021-03-14 ENCOUNTER — Ambulatory Visit (INDEPENDENT_AMBULATORY_CARE_PROVIDER_SITE_OTHER): Payer: No Typology Code available for payment source | Admitting: Primary Care

## 2021-03-14 DIAGNOSIS — Z6841 Body Mass Index (BMI) 40.0 and over, adult: Secondary | ICD-10-CM | POA: Diagnosis not present

## 2021-03-14 DIAGNOSIS — F32A Depression, unspecified: Secondary | ICD-10-CM | POA: Diagnosis not present

## 2021-03-14 DIAGNOSIS — F419 Anxiety disorder, unspecified: Secondary | ICD-10-CM | POA: Diagnosis not present

## 2021-03-14 MED ORDER — SERTRALINE HCL 100 MG PO TABS
100.0000 mg | ORAL_TABLET | Freq: Every day | ORAL | 3 refills | Status: DC
  Filled 2021-03-14: qty 90, 90d supply, fill #0

## 2021-03-14 NOTE — Assessment & Plan Note (Signed)
Improved on Zoloft 50 mg, not at goal.  Agree to increase the dose of Zoloft to 100 mg. Discussed to only take 1 tablet one daily with this new dose.  Rx sent to pharmacy. She will update if no continued improvement.

## 2021-03-14 NOTE — Progress Notes (Signed)
Subjective:    Patient ID: Felicia Salinas, female    DOB: 1991-02-07, 30 y.o.   MRN: [id number]  HPI  Felicia Salinas is a very pleasant 30 y.o. female with a history of OSA, PCOS, hirsutism, morbid obesity who presents today for follow up of morbid obesity and anxiety/depression.  1) Morbid Obesity: She was last evaluated on 12/27/20 regarding her morbid obesity. During this visit she had been prescribed Saxenda for weight loss but had yet to start as prior authorization was required. She obtained the prescription a week later and was able to begin.  Since her last visit she is compliant to [facility] and is injecting 3 mg daily. She is tolerating the injections well. She denies nausea, GI upset, heartburn. She has noticed some constipation.   She has lost 11 pounds.  Diet currently consists of:  Breakfast: [place] bacon, grits, mostly skips breakfast Lunch: Fast food - burger Dinner: Rice, corn, meat Snacks: None Desserts: Daily in the evening Beverages: Water, ICE drinks with Zero sugar. No sodas. Has reduced juice intake  Exercise: Walking some   Wt Readings from Last 3 Encounters:  03/14/21 293 lb (132.9 kg)  01/25/21 296 lb 9.6 oz (134.5 kg)  12/27/20 (!) 304 lb (137.9 kg)   2) Anxiety and Depression: Currently managed on Zoloft 50 mg which was initiated in October 2022 to replace lexapro which was ineffective. Since her last visit she's noticed improvement on Zoloft. Positive effects include less argumentative, able to handle stressful events.   She does continue to notice some difficulty with anxiety and depression symptoms and has been taking an extra 50 mg dose of Zoloft several times weekly with improvement. She is requesting to increase her dose officially today.  She denies any negative effects from Zoloft.    Review of Systems  Respiratory:  Negative for shortness of breath.   Cardiovascular:  Negative for chest pain.  Gastrointestinal:  Positive for constipation.  Negative for abdominal pain.  Neurological:  Negative for headaches.  Psychiatric/Behavioral:  Negative for suicidal ideas. The patient is nervous/anxious.        See HPI        Past Medical History:  Diagnosis Date   Angio-edema    Anxiety    Asthma    controlled per patient last attack in January   Environmental allergies    Obesity    Urticaria     Social History   Socioeconomic History   Marital status: Married    Spouse name: Not on file   Number of children: 0   Years of education: Not on file   Highest education level: Not on file  Occupational History   Occupation: representative    Employer: [employer]  Tobacco Use   Smoking status: Never   Smokeless tobacco: Never  Vaping Use   Vaping Use: Never used  Substance and Sexual Activity   Alcohol use: Not Currently    Comment: Occasional   Drug use: No   Sexual activity: Yes    Birth control/protection: None  Other Topics Concern   Not on file  Social History Narrative   Lives at home with husband and cat   Left handed   Caffeine: frappe occasionally    Social Determinants of Health   Financial Resource Strain: Not on file  Food Insecurity: Not on file  Transportation Needs: Not on file  Physical Activity: Not on file  Stress: Not on file  Social Connections: Not on file  Intimate Partner Violence:  Not on file    Past Surgical History:  Procedure Laterality Date   APPENDECTOMY  04/07/2011   CHOLECYSTECTOMY  10/18/2011   Procedure: LAPAROSCOPIC CHOLECYSTECTOMY WITH INTRAOPERATIVE CHOLANGIOGRAM;  Surgeon: Emelia Loron, MD;  Location: [facility] OR;  Service: General;  Laterality: N/A;   IR ANGIO INTRA EXTRACRAN SEL COM CAROTID INNOMINATE UNI R MOD SED  06/02/2019   IR ANGIO INTRA EXTRACRAN SEL INTERNAL CAROTID UNI L MOD SED  06/02/2019   IR ANGIO VERTEBRAL SEL VERTEBRAL UNI R MOD SED  06/02/2019   IR US GUIDE VASC ACCESS RIGHT  06/02/2019   TONSILLECTOMY     WISDOM TOOTH EXTRACTION      Family History  Problem  Relation Age of Onset   Breast cancer Mother        mets to spine   Asthma Mother    Asthma Father    Diabetes type II Sister    Breast cancer Maternal Grandmother    Diabetes Maternal Grandmother    Rheum arthritis Maternal Grandmother    Hypertension Maternal Grandfather    Diabetes Paternal Grandfather    Ovarian cancer Maternal Aunt    Ovarian cancer Paternal Aunt    Colon cancer Neg Hx     Allergies  Allergen Reactions   Dairy Aid [Tilactase] Anaphylaxis   Other Hives and Anaphylaxis     shrimp, dairy products, shellfish (Swelling) Other reaction(s): Hives   Shellfish Allergy Anaphylaxis   Benadryl [Diphenhydramine Hcl] Hives   Iodine Hives and Rash   Tape Rash    Current Outpatient Medications on File Prior to Visit  Medication Sig Dispense Refill   albuterol (VENTOLIN HFA) 108 (90 Base) MCG/ACT inhaler Inhale 2 puffs into the lungs every 4 (four) hours as needed for wheezing or shortness of breath (coughing). 18 g 2   clomiPHENE (CLOMID) 50 MG tablet Take 1 tablet by mouth every day 5 tablet 0   Galcanezumab-gnlm (EMGALITY) 120 MG/ML SOAJ Inject 1 mL into the skin every 30 (thirty) days. 1 mL 8   Insulin Pen Needle (UNIFINE PENTIPS) 31G X 6 MM MISC Use as directed with Saxenda 100 each 1   levocetirizine (XYZAL) 5 MG tablet Take 1 tablet (5 mg total) by mouth every evening. 30 tablet 5   Liraglutide -Weight Management (SAXENDA) 18 MG/3ML SOPN Start with injecting 0.6 mg daily for 1 week, 1.2 mg daily for the next week, 1.8 mg daily for next week. If tolerated, can further increase to 2.4 mg daily. Max dose: 3 mg/daily 15 mL 3   montelukast (SINGULAIR) 10 MG tablet Take 1 tablet (10 mg total) by mouth at bedtime. 30 tablet 5   sertraline (ZOLOFT) 50 MG tablet Take 1 tablet by mouth daily for anxiety and depression 90 tablet 0   albuterol (PROVENTIL) (5 MG/ML) 0.5% nebulizer solution Take 0.5 mLs (2.5 mg total) by nebulization every 6 (six) hours as needed for wheezing or  shortness of breath. (Patient not taking: Reported on 03/14/2021) 20 mL 0   fluticasone (FLONASE) 50 MCG/ACT nasal spray Place 1 spray into both nostrils 2 (two) times daily as needed for rhinitis. (Patient not taking: Reported on 03/14/2021) 16 g 5   Rimegepant Sulfate (NURTEC) 75 MG TBDP Dissolve 1 tablet by mouth once as needed. May repeat once after 2 hours.  No more than 2 tablets in 24 hours. (Patient not taking: Reported on 03/14/2021) 10 tablet 3   No current facility-administered medications on file prior to visit.    BP 110/74  Pulse 82    Temp 97.7 F (36.5 C) (Temporal)    Ht 5' 5.5" (1.664 m)    Wt 293 lb (132.9 kg)    SpO2 97%    BMI 48.02 kg/m  Objective:   Physical Exam Cardiovascular:     Rate and Rhythm: Normal rate and regular rhythm.  Pulmonary:     Effort: Pulmonary effort is normal.     Breath sounds: Normal breath sounds.  Musculoskeletal:     Cervical back: Neck supple.  Skin:    General: Skin is warm and dry.  Psychiatric:        Mood and Affect: Mood normal.          Assessment & Plan:      This visit occurred during the SARS-CoV-2 public health emergency.  Safety protocols were in place, including screening questions prior to the visit, additional usage of staff PPE, and extensive cleaning of exam room while observing appropriate contact time as indicated for disinfecting solutions.

## 2021-03-14 NOTE — Patient Instructions (Signed)
We increased the dose of your Zoloft to 100 mg. Only take 1 of these new tablets once daily.  Continue Saxenda 3 mg weekly.  Work on [employer] by cutting out/back on corn, rice, pasta, sugary drinks.  Continue exercising. You should be getting 150 minutes of moderate intensity exercise weekly.  Ensure you are consuming 64 ounces of water daily.  Please schedule a follow up visit for 3 months.  It was a pleasure to see you today!

## 2021-03-14 NOTE — Assessment & Plan Note (Signed)
Weight loss of 11 pounds since last visit, commended her on this!  We discussed her diet which could use some improving, she agrees. We discussed to limit calories from beverages, reduce carb heavy foods such as rice/corn, increase exercise.  Continue Saxenda 3 mg daily. Follow up in 2-3 months.

## 2021-03-16 ENCOUNTER — Other Ambulatory Visit (HOSPITAL_COMMUNITY): Payer: Self-pay

## 2021-03-22 ENCOUNTER — Telehealth: Payer: No Typology Code available for payment source | Admitting: Physician Assistant

## 2021-03-22 DIAGNOSIS — J208 Acute bronchitis due to other specified organisms: Secondary | ICD-10-CM | POA: Diagnosis not present

## 2021-03-22 DIAGNOSIS — B9689 Other specified bacterial agents as the cause of diseases classified elsewhere: Secondary | ICD-10-CM | POA: Diagnosis not present

## 2021-03-22 MED ORDER — PSEUDOEPH-BROMPHEN-DM 30-2-10 MG/5ML PO SYRP
5.0000 mL | ORAL_SOLUTION | Freq: Four times a day (QID) | ORAL | 0 refills | Status: DC | PRN
  Filled 2021-03-22: qty 120, 6d supply, fill #0

## 2021-03-22 MED ORDER — BENZONATATE 100 MG PO CAPS
100.0000 mg | ORAL_CAPSULE | Freq: Three times a day (TID) | ORAL | 0 refills | Status: DC | PRN
  Filled 2021-03-22: qty 30, 10d supply, fill #0

## 2021-03-22 MED ORDER — AZITHROMYCIN 250 MG PO TABS
ORAL_TABLET | ORAL | 0 refills | Status: AC
  Filled 2021-03-22: qty 6, 5d supply, fill #0

## 2021-03-22 NOTE — Progress Notes (Signed)
Virtual Visit Consent   [name], you are scheduled for a virtual visit with a [facility] provider today.     Just as with appointments in the office, your consent must be obtained to participate.  Your consent will be active for this visit and any virtual visit you may have with one of our providers in the next 365 days.     If you have a MyChart account, a copy of this consent can be sent to you electronically.  All virtual visits are billed to your insurance company just like a traditional visit in the office.    As this is a virtual visit, video technology does not allow for your provider to perform a traditional examination.  This may limit your provider's ability to fully assess your condition.  If your provider identifies any concerns that need to be evaluated in person or the need to arrange testing (such as labs, EKG, etc.), we will make arrangements to do so.     Although advances in technology are sophisticated, we cannot ensure that it will always work on either your end or our end.  If the connection with a video visit is poor, the visit may have to be switched to a telephone visit.  With either a video or telephone visit, we are not always able to ensure that we have a secure connection.     I need to obtain your verbal consent now.   Are you willing to proceed with your visit today?    Felicia Salinas has provided verbal consent on 03/22/2021 for a virtual visit (video or telephone).   [name], PA-C   Date: 03/22/2021 6:47 PM   Virtual Visit via Video Note   I, [name], connected with  Felicia Salinas  ([id number], 1990-08-26) on 03/22/21 at  6:45 PM EST by a video-enabled telemedicine application and verified that I am speaking with the correct person using two identifiers.  Location: Patient: Virtual Visit Location Patient: Home Provider: Virtual Visit Location Provider: Home Office   I discussed the limitations of evaluation and management by  telemedicine and the availability of in person appointments. The patient expressed understanding and agreed to proceed.    History of Present Illness: Felicia Salinas is a 30 y.o. who identifies as a female who was assigned female at birth, and is being seen today for URI symptoms.  HPI: URI  This is a new problem. The current episode started 1 to 4 weeks ago. The problem has been gradually worsening. There has been no fever. Associated symptoms include congestion, coughing, diarrhea (yesterday) and a sore throat. Pertinent negatives include no headaches, nausea, rhinorrhea, sinus pain, swollen glands or vomiting. Associated symptoms comments: Laryngitis, chills. Treatments tried: thera flu, mucinex. The treatment provided no relief.   At home covid testing  is negative  Problems:  Patient Active Problem List   Diagnosis Date Noted   Morbid obesity with BMI of 45.0-49.9, adult (HCC) 12/27/2020   Anxiety and depression 12/27/2020   Other allergic rhinitis 07/04/2020   Lactose intolerance 07/04/2020   Adverse reaction to food, subsequent encounter 07/04/2020   Urticaria 07/04/2020   Hirsutism 05/06/2020   Infertility, female 05/06/2020   Empty sella turcica (HCC) 05/06/2020   Chronic migraine without aura, with intractable migraine, so stated, with status migrainosus 10/16/2019   OSA (obstructive sleep apnea) 10/16/2019   Insomnia 10/16/2019   Asthma 09/29/2018   Polycystic ovary syndrome 09/29/2018    Allergies:  Allergies  Allergen  Reactions   Dairy Aid [Tilactase] Anaphylaxis   Other Hives and Anaphylaxis     shrimp, dairy products, shellfish (Swelling) Other reaction(s): Hives   Shellfish Allergy Anaphylaxis   Benadryl [Diphenhydramine Hcl] Hives   Iodine Hives and Rash   Tape Rash   Medications:  Current Outpatient Medications:    azithromycin (ZITHROMAX) 250 MG tablet, Take 2 tablets on day 1, then 1 tablet daily on days 2 through 5, Disp: 6 tablet, Rfl: 0   benzonatate  (TESSALON) 100 MG capsule, Take 1 capsule (100 mg total) by mouth 3 (three) times daily as needed., Disp: 30 capsule, Rfl: 0   brompheniramine-pseudoephedrine-DM 30-2-10 MG/5ML syrup, Take 5 mLs by mouth 4 (four) times daily as needed., Disp: 120 mL, Rfl: 0   albuterol (PROVENTIL) (5 MG/ML) 0.5% nebulizer solution, Take 0.5 mLs (2.5 mg total) by nebulization every 6 (six) hours as needed for wheezing or shortness of breath. (Patient not taking: Reported on 03/14/2021), Disp: 20 mL, Rfl: 0   albuterol (VENTOLIN HFA) 108 (90 Base) MCG/ACT inhaler, Inhale 2 puffs into the lungs every 4 (four) hours as needed for wheezing or shortness of breath (coughing)., Disp: 18 g, Rfl: 2   clomiPHENE (CLOMID) 50 MG tablet, Take 1 tablet by mouth every day, Disp: 5 tablet, Rfl: 0   fluticasone (FLONASE) 50 MCG/ACT nasal spray, Place 1 spray into both nostrils 2 (two) times daily as needed for rhinitis. (Patient not taking: Reported on 03/14/2021), Disp: 16 g, Rfl: 5   Galcanezumab-gnlm (EMGALITY) 120 MG/ML SOAJ, Inject 1 mL into the skin every 30 (thirty) days., Disp: 1 mL, Rfl: 8   Insulin Pen Needle (UNIFINE PENTIPS) 31G X 6 MM MISC, Use as directed with Saxenda, Disp: 100 each, Rfl: 1   levocetirizine (XYZAL) 5 MG tablet, Take 1 tablet (5 mg total) by mouth every evening., Disp: 30 tablet, Rfl: 5   Liraglutide -Weight Management (SAXENDA) 18 MG/3ML SOPN, Start with injecting 0.6 mg daily for 1 week, 1.2 mg daily for the next week, 1.8 mg daily for next week. If tolerated, can further increase to 2.4 mg daily. Max dose: 3 mg/daily, Disp: 15 mL, Rfl: 3   montelukast (SINGULAIR) 10 MG tablet, Take 1 tablet (10 mg total) by mouth at bedtime., Disp: 30 tablet, Rfl: 5   Rimegepant Sulfate (NURTEC) 75 MG TBDP, Dissolve 1 tablet by mouth once as needed. May repeat once after 2 hours.  No more than 2 tablets in 24 hours. (Patient not taking: Reported on 03/14/2021), Disp: 10 tablet, Rfl: 3   sertraline (ZOLOFT) 100 MG tablet,  Take 1 tablet by mouth daily for anxiety and depression, Disp: 90 tablet, Rfl: 3  Observations/Objective: Patient is well-developed, well-nourished in no acute distress.  Resting comfortably at home.  Head is normocephalic, atraumatic.  No labored breathing.  Speech is clear and coherent with logical content.  Patient is alert and oriented at baseline.    Assessment and Plan: 1. Acute bacterial bronchitis - azithromycin (ZITHROMAX) 250 MG tablet; Take 2 tablets on day 1, then 1 tablet daily on days 2 through 5  Dispense: 6 tablet; Refill: 0 - brompheniramine-pseudoephedrine-DM 30-2-10 MG/5ML syrup; Take 5 mLs by mouth 4 (four) times daily as needed.  Dispense: 120 mL; Refill: 0 - benzonatate (TESSALON) 100 MG capsule; Take 1 capsule (100 mg total) by mouth 3 (three) times daily as needed.  Dispense: 30 capsule; Refill: 0  - Symptoms present over a week; suspect bronchitis - Will treat with Zpack, Bromfed DM,  and tessalon perles - Warm liquids, salt water gargles, and voice rest for laryngitis - Push fluids - Rest - Seek in person evaluation if not improving or worsening  Follow Up Instructions: I discussed the assessment and treatment plan with the patient. The patient was provided an opportunity to ask questions and all were answered. The patient agreed with the plan and demonstrated an understanding of the instructions.  A copy of instructions were sent to the patient via MyChart unless otherwise noted below.    The patient was advised to call back or seek an in-person evaluation if the symptoms worsen or if the condition fails to improve as anticipated.  Time:  I spent 10 minutes with the patient via telehealth technology discussing the above problems/concerns.    [name], PA-C

## 2021-03-22 NOTE — Patient Instructions (Signed)
Felicia Salinas, thank you for joining [name], PA-C for today's virtual visit.  While this provider is not your primary care provider (PCP), if your PCP is located in our provider database this encounter information will be shared with them immediately following your visit.  Consent: (Patient) Felicia Salinas provided verbal consent for this virtual visit at the beginning of the encounter.  Current Medications:  Current Outpatient Medications:    azithromycin (ZITHROMAX) 250 MG tablet, Take 2 tablets on day 1, then 1 tablet daily on days 2 through 5, Disp: 6 tablet, Rfl: 0   benzonatate (TESSALON) 100 MG capsule, Take 1 capsule (100 mg total) by mouth 3 (three) times daily as needed., Disp: 30 capsule, Rfl: 0   brompheniramine-pseudoephedrine-DM 30-2-10 MG/5ML syrup, Take 5 mLs by mouth 4 (four) times daily as needed., Disp: 120 mL, Rfl: 0   albuterol (PROVENTIL) (5 MG/ML) 0.5% nebulizer solution, Take 0.5 mLs (2.5 mg total) by nebulization every 6 (six) hours as needed for wheezing or shortness of breath. (Patient not taking: Reported on 03/14/2021), Disp: 20 mL, Rfl: 0   albuterol (VENTOLIN HFA) 108 (90 Base) MCG/ACT inhaler, Inhale 2 puffs into the lungs every 4 (four) hours as needed for wheezing or shortness of breath (coughing)., Disp: 18 g, Rfl: 2   clomiPHENE (CLOMID) 50 MG tablet, Take 1 tablet by mouth every day, Disp: 5 tablet, Rfl: 0   fluticasone (FLONASE) 50 MCG/ACT nasal spray, Place 1 spray into both nostrils 2 (two) times daily as needed for rhinitis. (Patient not taking: Reported on 03/14/2021), Disp: 16 g, Rfl: 5   Galcanezumab-gnlm (EMGALITY) 120 MG/ML SOAJ, Inject 1 mL into the skin every 30 (thirty) days., Disp: 1 mL, Rfl: 8   Insulin Pen Needle (UNIFINE PENTIPS) 31G X 6 MM MISC, Use as directed with Saxenda, Disp: 100 each, Rfl: 1   levocetirizine (XYZAL) 5 MG tablet, Take 1 tablet (5 mg total) by mouth every evening., Disp: 30 tablet, Rfl: 5   Liraglutide -Weight  Management (SAXENDA) 18 MG/3ML SOPN, Start with injecting 0.6 mg daily for 1 week, 1.2 mg daily for the next week, 1.8 mg daily for next week. If tolerated, can further increase to 2.4 mg daily. Max dose: 3 mg/daily, Disp: 15 mL, Rfl: 3   montelukast (SINGULAIR) 10 MG tablet, Take 1 tablet (10 mg total) by mouth at bedtime., Disp: 30 tablet, Rfl: 5   Rimegepant Sulfate (NURTEC) 75 MG TBDP, Dissolve 1 tablet by mouth once as needed. May repeat once after 2 hours.  No more than 2 tablets in 24 hours. (Patient not taking: Reported on 03/14/2021), Disp: 10 tablet, Rfl: 3   sertraline (ZOLOFT) 100 MG tablet, Take 1 tablet by mouth daily for anxiety and depression, Disp: 90 tablet, Rfl: 3   Medications ordered in this encounter:  Meds ordered this encounter  Medications   azithromycin (ZITHROMAX) 250 MG tablet    Sig: Take 2 tablets on day 1, then 1 tablet daily on days 2 through 5    Dispense:  6 tablet    Refill:  0    Order Specific Question:   Supervising Provider    Answer:   Eber Hong [[id number]]   brompheniramine-pseudoephedrine-DM 30-2-10 MG/5ML syrup    Sig: Take 5 mLs by mouth 4 (four) times daily as needed.    Dispense:  120 mL    Refill:  0    Order Specific Question:   Supervising Provider    Answer:   Eber Hong [[id number]]  benzonatate (TESSALON) 100 MG capsule    Sig: Take 1 capsule (100 mg total) by mouth 3 (three) times daily as needed.    Dispense:  30 capsule    Refill:  0    Order Specific Question:   Supervising Provider    Answer:   Hyacinth Meeker, BRIAN [[id number]]     *If you need refills on other medications prior to your next appointment, please contact your pharmacy*  Follow-Up: Call back or seek an in-person evaluation if the symptoms worsen or if the condition fails to improve as anticipated.  Other Instructions Acute Bronchitis, Adult Acute bronchitis is when air tubes in the lungs (bronchi) suddenly get swollen. The condition can make it hard for you to breathe. In  adults, acute bronchitis usually goes away within 2 weeks. A cough caused by bronchitis may last up to 3 weeks. Smoking, allergies, and asthma can make the condition worse. What are the causes? Germs that cause cold and flu (viruses). The most common cause of this condition is the virus that causes the common cold. Bacteria. Substances that bother (irritate) the lungs, including: Smoke from cigarettes and other types of tobacco. Dust and pollen. Fumes from chemicals, gases, or burned fuel. Indoor or outdoor air pollution. What increases the risk? A weak body's defense system. This is also called the immune system. Any condition that affects your lungs and breathing, such as asthma. What are the signs or symptoms? A cough. Coughing up clear, yellow, or green mucus. Making high-pitched whistling sounds when you breathe, most often when you breathe out (wheezing). Runny or stuffy nose. Having too much mucus in your lungs (chest congestion). Shortness of breath. Body aches. A sore throat. How is this treated? Acute bronchitis may go away over time without treatment. Your doctor may tell you to: Drink more fluids. This will help thin your mucus so it is easier to cough up. Use a device that gets medicine into your lungs (inhaler). Use a vaporizer or a humidifier. These are machines that add water to the air. This helps with coughing and poor breathing. Take a medicine that thins mucus and helps clear it from your lungs. Take a medicine that prevents or stops coughing. It is not common to take an antibiotic medicine for this condition. Follow these instructions at home:  Take over-the-counter and prescription medicines only as told by your doctor. Use an inhaler, vaporizer, or humidifier as told by your doctor. Take two teaspoons (10 mL) of honey at bedtime. This helps lessen your coughing at night. Drink enough fluid to keep your pee (urine) pale yellow. Do not smoke or use any products  that contain nicotine or tobacco. If you need help quitting, ask your doctor. Get a lot of rest. Return to your normal activities when your doctor says that it is safe. Keep all follow-up visits. How is this prevented?  Wash your hands often with soap and water for at least 20 seconds. If you cannot use soap and water, use hand sanitizer. Avoid contact with people who have cold symptoms. Try not to touch your mouth, nose, or eyes with your hands. Avoid breathing in smoke or chemical fumes. Make sure to get the flu shot every year. Contact a doctor if: Your symptoms do not get better in 2 weeks. You have trouble coughing up the mucus. Your cough keeps you awake at night. You have a fever. Get help right away if: You cough up blood. You have chest pain. You have very bad  shortness of breath. You faint or keep feeling like you are going to faint. You have a very bad headache. Your fever or chills get worse. These symptoms may be an emergency. Get help right away. Call your local emergency services (911 in the [place] Do not wait to see if the symptoms will go away. Do not drive yourself to the hospital. Summary Acute bronchitis is when air tubes in the lungs (bronchi) suddenly get swollen. In adults, acute bronchitis usually goes away within 2 weeks. Drink more fluids. This will help thin your mucus so it is easier to cough up. Take over-the-counter and prescription medicines only as told by your doctor. Contact a doctor if your symptoms do not improve after 2 weeks of treatment. This information is not intended to replace advice given to you by your health care provider. Make sure you discuss any questions you have with your health care provider. Document Revised: 07/13/2020 Document Reviewed: 07/13/2020 Elsevier Patient Education  2022 [employer].    If you have been instructed to have an in-person evaluation today at a local Urgent Care facility, please use the link below. It  will take you to a list of all of our available [facility], including address, phone number and hours of operation. Please do not delay care.  [facility]  If you or a family member do not have a primary care provider, use the link below to schedule a visit and establish care. When you choose a [facility] primary care physician or advanced practice provider, you gain a long-term partner in health. Find a Primary Care Provider  Learn more about [facility]'s in-office and virtual care options: [facility] - Get Care Now

## 2021-03-23 ENCOUNTER — Other Ambulatory Visit (HOSPITAL_COMMUNITY): Payer: Self-pay

## 2021-03-31 ENCOUNTER — Other Ambulatory Visit (HOSPITAL_COMMUNITY): Payer: Self-pay

## 2021-04-17 ENCOUNTER — Other Ambulatory Visit (HOSPITAL_COMMUNITY): Payer: Self-pay

## 2021-04-18 ENCOUNTER — Ambulatory Visit (INDEPENDENT_AMBULATORY_CARE_PROVIDER_SITE_OTHER): Payer: No Typology Code available for payment source | Admitting: Primary Care

## 2021-04-18 ENCOUNTER — Other Ambulatory Visit: Payer: Self-pay

## 2021-04-18 ENCOUNTER — Other Ambulatory Visit (HOSPITAL_COMMUNITY): Payer: Self-pay

## 2021-04-18 VITALS — BP 110/82 | HR 77 | Temp 98.7°F | Ht 65.5 in | Wt 292.0 lb

## 2021-04-18 DIAGNOSIS — K649 Unspecified hemorrhoids: Secondary | ICD-10-CM | POA: Diagnosis not present

## 2021-04-18 MED ORDER — HYDROCORTISONE ACETATE 25 MG RE SUPP
25.0000 mg | Freq: Two times a day (BID) | RECTAL | 0 refills | Status: DC
  Filled 2021-04-18: qty 12, 6d supply, fill #0

## 2021-04-18 NOTE — Progress Notes (Signed)
Subjective:    Patient ID: Felicia Salinas, female    DOB: 1990/11/20, 31 y.o.   MRN: [id number]  HPI  Felicia Salinas is a very pleasant 31 y.o. female with a history of morbid obesity, lactose intolerance, asthma, OSA, chronic migraines who presents today to discuss hemorrhoids.  She's experienced constipation since starting Saxenda injections for weight loss. Typically bowel movements are daily, but now they are every 3-4 days.   Two weeks ago she began using a stool softener which took effect last week. Last week she noticed bright red rectal bleeding on the toilet paper after a bowel movement. Since then ,each time she has a bowel movement, she notices the same bright red bleeding. She's also noticed discomfort to the rectum with at least one large hemorrhoid. She has noticed pain with sitting and bowel movements.   She's been applying preparation H multiple times daily without improvement.     Wt Readings from Last 3 Encounters:  04/18/21 292 lb (132.5 kg)  03/14/21 293 lb (132.9 kg)  01/25/21 296 lb 9.6 oz (134.5 kg)        Review of Systems  Constitutional:  Negative for fever.  Gastrointestinal:  Positive for blood in stool and constipation. Negative for abdominal pain, diarrhea, nausea and vomiting.        Past Medical History:  Diagnosis Date   Angio-edema    Anxiety    Asthma    controlled per patient last attack in January   Environmental allergies    Obesity    Urticaria     Social History   Socioeconomic History   Marital status: Married    Spouse name: Not on file   Number of children: 0   Years of education: Not on file   Highest education level: Not on file  Occupational History   Occupation: representative    Employer: [employer]  Tobacco Use   Smoking status: Never   Smokeless tobacco: Never  Vaping Use   Vaping Use: Never used  Substance and Sexual Activity   Alcohol use: Not Currently    Comment: Occasional   Drug use: No   Sexual activity:  Yes    Birth control/protection: None  Other Topics Concern   Not on file  Social History Narrative   Lives at home with husband and cat   Left handed   Caffeine: frappe occasionally    Social Determinants of Health   Financial Resource Strain: Not on file  Food Insecurity: Not on file  Transportation Needs: Not on file  Physical Activity: Not on file  Stress: Not on file  Social Connections: Not on file  Intimate Partner Violence: Not on file    Past Surgical History:  Procedure Laterality Date   APPENDECTOMY  04/07/2011   CHOLECYSTECTOMY  10/18/2011   Procedure: LAPAROSCOPIC CHOLECYSTECTOMY WITH INTRAOPERATIVE CHOLANGIOGRAM;  Surgeon: Emelia Loron, MD;  Location: [facility] OR;  Service: General;  Laterality: N/A;   IR ANGIO INTRA EXTRACRAN SEL COM CAROTID INNOMINATE UNI R MOD SED  06/02/2019   IR ANGIO INTRA EXTRACRAN SEL INTERNAL CAROTID UNI L MOD SED  06/02/2019   IR ANGIO VERTEBRAL SEL VERTEBRAL UNI R MOD SED  06/02/2019   IR US GUIDE VASC ACCESS RIGHT  06/02/2019   TONSILLECTOMY     WISDOM TOOTH EXTRACTION      Family History  Problem Relation Age of Onset   Breast cancer Mother        mets to spine   Asthma Mother  Asthma Father    Diabetes type II Sister    Breast cancer Maternal Grandmother    Diabetes Maternal Grandmother    Rheum arthritis Maternal Grandmother    Hypertension Maternal Grandfather    Diabetes Paternal Grandfather    Ovarian cancer Maternal Aunt    Ovarian cancer Paternal Aunt    Colon cancer Neg Hx     Allergies  Allergen Reactions   Dairy Aid [Tilactase] Anaphylaxis   Other Hives and Anaphylaxis     shrimp, dairy products, shellfish (Swelling) Other reaction(s): Hives   Shellfish Allergy Anaphylaxis   Benadryl [Diphenhydramine Hcl] Hives   Iodine Hives and Rash   Tape Rash    Current Outpatient Medications on File Prior to Visit  Medication Sig Dispense Refill   albuterol (PROVENTIL) (5 MG/ML) 0.5% nebulizer solution Take 0.5 mLs (2.5  mg total) by nebulization every 6 (six) hours as needed for wheezing or shortness of breath. 20 mL 0   albuterol (VENTOLIN HFA) 108 (90 Base) MCG/ACT inhaler Inhale 2 puffs into the lungs every 4 (four) hours as needed for wheezing or shortness of breath (coughing). 18 g 2   brompheniramine-pseudoephedrine-DM 30-2-10 MG/5ML syrup Take 5 mLs by mouth 4 (four) times daily as needed. 120 mL 0   clomiPHENE (CLOMID) 50 MG tablet Take 1 tablet by mouth every day 5 tablet 0   Galcanezumab-gnlm (EMGALITY) 120 MG/ML SOAJ Inject 1 mL into the skin every 30 (thirty) days. 1 mL 8   Insulin Pen Needle (UNIFINE PENTIPS) 31G X 6 MM MISC Use as directed with Saxenda 100 each 1   levocetirizine (XYZAL) 5 MG tablet Take 1 tablet (5 mg total) by mouth every evening. 30 tablet 5   Liraglutide -Weight Management (SAXENDA) 18 MG/3ML SOPN Start with injecting 0.6 mg daily for 1 week, 1.2 mg daily for the next week, 1.8 mg daily for next week. If tolerated, can further increase to 2.4 mg daily. Max dose: 3 mg/daily 15 mL 3   montelukast (SINGULAIR) 10 MG tablet Take 1 tablet (10 mg total) by mouth at bedtime. 30 tablet 5   Rimegepant Sulfate (NURTEC) 75 MG TBDP Dissolve 1 tablet by mouth once as needed. May repeat once after 2 hours.  No more than 2 tablets in 24 hours. 10 tablet 3   sertraline (ZOLOFT) 100 MG tablet Take 1 tablet by mouth daily for anxiety and depression 90 tablet 3   fluticasone (FLONASE) 50 MCG/ACT nasal spray Place 1 spray into both nostrils 2 (two) times daily as needed for rhinitis. (Patient not taking: Reported on 03/14/2021) 16 g 5   No current facility-administered medications on file prior to visit.    BP 110/82    Pulse 77    Temp 98.7 F (37.1 C) (Temporal)    Ht 5' 5.5" (1.664 m)    Wt 292 lb (132.5 kg)    SpO2 98%    BMI 47.85 kg/m  Objective:   Physical Exam Constitutional:      General: She is not in acute distress. Genitourinary:    Rectum: Tenderness and external hemorrhoid  present.     Comments: Small, non thrombosed hemorrhoid noted to anus. Tender.  Skin:    General: Skin is warm and dry.  Neurological:     Mental Status: She is alert.          Assessment & Plan:      This visit occurred during the SARS-CoV-2 public health emergency.  Safety protocols were in place, including  screening questions prior to the visit, additional usage of staff PPE, and extensive cleaning of exam room while observing appropriate contact time as indicated for disinfecting solutions.

## 2021-04-18 NOTE — Patient Instructions (Signed)
Insert the suppositories twice daily for one week.  You can try a sitz bath for the pain.   Okay to increase your stool softener to 2 capsules daily as long as you don't experience diarrhea.  It was a pleasure to see you today!  Hemorrhoids Hemorrhoids are swollen veins that may develop: In the butt (rectum). These are called internal hemorrhoids. Around the opening of the butt (anus). These are called external hemorrhoids. Hemorrhoids can cause pain, itching, or bleeding. Most of the time, they do not cause serious problems. They usually get better with diet changes, lifestyle changes, and other home treatments. What are the causes? This condition may be caused by: Having trouble pooping (constipation). Pushing hard (straining) to poop. Watery poop (diarrhea). Pregnancy. Being very overweight (obese). Sitting for long periods of time. Heavy lifting or other activity that causes you to strain. Anal sex. Riding a bike for a long period of time. What are the signs or symptoms? Symptoms of this condition include: Pain. Itching or soreness in the butt. Bleeding from the butt. Leaking poop. Swelling in the area. One or more lumps around the opening of your butt. How is this diagnosed? A doctor can often diagnose this condition by looking at the affected area. The doctor may also: Do an exam that involves feeling the area with a gloved hand (digital rectal exam). Examine the area inside your butt using a small tube (anoscope). Order blood tests. This may be done if you have lost a lot of blood. Have you get a test that involves looking inside the colon using a flexible tube with a camera on the end (sigmoidoscopy or colonoscopy). How is this treated? This condition can usually be treated at home. Your doctor may tell you to change what you eat, make lifestyle changes, or try home treatments. If these do not help, procedures can be done to remove the hemorrhoids or make them smaller.  These may involve: Placing rubber bands at the base of the hemorrhoids to cut off their blood supply. Injecting medicine into the hemorrhoids to shrink them. Shining a type of light energy onto the hemorrhoids to cause them to fall off. Doing surgery to remove the hemorrhoids or cut off their blood supply. Follow these instructions at home: Eating and drinking  Eat foods that have a lot of fiber in them. These include whole grains, beans, nuts, fruits, and vegetables. Ask your doctor about taking products that have added fiber (fibersupplements). Reduce the amount of fat in your diet. You can do this by: Eating low-fat dairy products. Eating less red meat. Avoiding processed foods. Drink enough fluid to keep your pee (urine) pale yellow. Managing pain and swelling  Take a warm-water bath (sitz bath) for 20 minutes to ease pain. Do this 3-4 times a day. You may do this in a bathtub or using a portable sitz bath that fits over the toilet. If told, put ice on the painful area. It may be helpful to use ice between your warm baths. Put ice in a plastic bag. Place a towel between your skin and the bag. Leave the ice on for 20 minutes, 2-3 times a day. General instructions Take over-the-counter and prescription medicines only as told by your doctor. Medicated creams and medicines may be used as told. Exercise often. Ask your doctor how much and what kind of exercise is best for you. Go to the bathroom when you have the urge to poop. Do not wait. Avoid pushing too hard when  you poop. Keep your butt dry and clean. Use wet toilet paper or moist towelettes after pooping. Do not sit on the toilet for a long time. Keep all follow-up visits as told by your doctor. This is important. Contact a doctor if you: Have pain and swelling that do not get better with treatment or medicine. Have trouble pooping. Cannot poop. Have pain or swelling outside the area of the hemorrhoids. Get help right away if  you have: Bleeding that will not stop. Summary Hemorrhoids are swollen veins in the butt or around the opening of the butt. They can cause pain, itching, or bleeding. Eat foods that have a lot of fiber in them. These include whole grains, beans, nuts, fruits, and vegetables. Take a warm-water bath (sitz bath) for 20 minutes to ease pain. Do this 3-4 times a day. This information is not intended to replace advice given to you by your health care provider. Make sure you discuss any questions you have with your health care provider. Document Revised: 09/21/2020 Document Reviewed: 09/21/2020 Elsevier Patient Education  2022 [employer].

## 2021-04-18 NOTE — Assessment & Plan Note (Signed)
Noted on exam today.  Discussed conservative management such as sitz baths, cool ice packs, etc.   Rx for Anusol-HC suppositories sent to pharmacy to use BID x 6 days.   Discussed to increase stool softener to 2 capsules daily as long as this doesn't cause diarrhea.

## 2021-04-19 ENCOUNTER — Other Ambulatory Visit (HOSPITAL_COMMUNITY): Payer: Self-pay

## 2021-04-25 ENCOUNTER — Other Ambulatory Visit (HOSPITAL_COMMUNITY): Payer: Self-pay

## 2021-04-29 ENCOUNTER — Other Ambulatory Visit (HOSPITAL_COMMUNITY): Payer: Self-pay

## 2021-05-01 ENCOUNTER — Other Ambulatory Visit (HOSPITAL_COMMUNITY): Payer: Self-pay

## 2021-05-01 MED ORDER — CLOMID 50 MG PO TABS
50.0000 mg | ORAL_TABLET | Freq: Every day | ORAL | 0 refills | Status: DC
  Filled 2021-05-01 – 2021-05-25 (×2): qty 5, 5d supply, fill #0

## 2021-05-10 ENCOUNTER — Other Ambulatory Visit (HOSPITAL_COMMUNITY): Payer: Self-pay

## 2021-05-12 ENCOUNTER — Ambulatory Visit (INDEPENDENT_AMBULATORY_CARE_PROVIDER_SITE_OTHER): Payer: No Typology Code available for payment source | Admitting: Primary Care

## 2021-05-12 ENCOUNTER — Other Ambulatory Visit: Payer: Self-pay

## 2021-05-12 ENCOUNTER — Ambulatory Visit: Payer: No Typology Code available for payment source | Admitting: Primary Care

## 2021-05-12 ENCOUNTER — Other Ambulatory Visit (HOSPITAL_COMMUNITY): Payer: Self-pay

## 2021-05-12 ENCOUNTER — Encounter: Payer: Self-pay | Admitting: Primary Care

## 2021-05-12 VITALS — BP 110/74 | Temp 98.6°F | Ht 65.5 in | Wt 287.0 lb

## 2021-05-12 DIAGNOSIS — F32A Depression, unspecified: Secondary | ICD-10-CM | POA: Diagnosis not present

## 2021-05-12 DIAGNOSIS — K649 Unspecified hemorrhoids: Secondary | ICD-10-CM | POA: Diagnosis not present

## 2021-05-12 DIAGNOSIS — F419 Anxiety disorder, unspecified: Secondary | ICD-10-CM

## 2021-05-12 MED ORDER — VENLAFAXINE HCL ER 37.5 MG PO CP24
ORAL_CAPSULE | ORAL | 0 refills | Status: DC
  Filled 2021-05-12: qty 180, 90d supply, fill #0

## 2021-05-12 MED ORDER — HYDROXYZINE HCL 10 MG PO TABS
10.0000 mg | ORAL_TABLET | Freq: Two times a day (BID) | ORAL | 0 refills | Status: DC | PRN
  Filled 2021-05-12: qty 60, 30d supply, fill #0

## 2021-05-12 MED ORDER — HYDROCORTISONE ACETATE 25 MG RE SUPP
25.0000 mg | Freq: Two times a day (BID) | RECTAL | 0 refills | Status: DC
  Filled 2021-05-12: qty 12, 6d supply, fill #0

## 2021-05-12 NOTE — Assessment & Plan Note (Signed)
Anxiety uncontrolled.  Lexapro ineffective, now Zoloft ineffective for anxiety.  We will switch to SNRI.   Start venlafaxine ER 37.5 mg daily x1 week, then increase to 75 mg daily thereafter.  She will also reduce sertraline to 50 mg daily x1 week while taking the venlafaxine ER 37.5 mg dose.  She will discontinue Zoloft when she increases her venlafaxine dose to 75 mg.  Start hydroxyzine 10 mg daily as needed for acute anxiety/panic attacks.  Drowsiness precautions provided.  We will plan to see her back for follow-up in 1 month.

## 2021-05-12 NOTE — Assessment & Plan Note (Signed)
Improved with hydrocortisone suppositories.  Refill sent to pharmacy.

## 2021-05-12 NOTE — Progress Notes (Signed)
Subjective:    Patient ID: Felicia Salinas, female    DOB: [birth year], 31 y.o.   MRN: [id number]  HPI  Felicia Salinas is a very pleasant 31 y.o. female with a history of chronic migraines, asthma, PCOS, insomnia, anxiety and depression who presents today to discuss anxiety.  She is also requesting refill of her hydrocortisone suppositories.  This is helped tremendously with her hemorrhoids.  Currently managed on sertraline 100 mg daily which was increased from 50 mg daily in December 2022.     Today she discusses that sertraline has helped with depression, but not anxiety, not even from the beginning. Current symptoms include irritability, tearfulness, anxiousness, worrying which began about 2-3 weeks ago.   She's under a lot of stress as her husband is in the hospital as of last night. She took 300 mg of Zoloft yesterday, no improvement in symptoms and no side effects.   She was initially managed on Lexapro which was ineffective. She denies SI/HI.    Review of Systems  Respiratory:  Negative for shortness of breath.   Cardiovascular:  Negative for chest pain.  Gastrointestinal:  Negative for nausea.  Psychiatric/Behavioral:  Negative for suicidal ideas. The patient is nervous/anxious.        See HPI        Past Medical History:  Diagnosis Date   Angio-edema    Anxiety    Asthma    controlled per patient last attack in January   Environmental allergies    Obesity    Urticaria     Social History   Socioeconomic History   Marital status: Married    Spouse name: Not on file   Number of children: 0   Years of education: Not on file   Highest education level: Not on file  Occupational History   Occupation: representative    Employer: [employer]  Tobacco Use   Smoking status: Never   Smokeless tobacco: Never  Vaping Use   Vaping Use: Never used  Substance and Sexual Activity   Alcohol use: Not Currently    Comment: Occasional   Drug use: No   Sexual activity: Yes     Birth control/protection: None  Other Topics Concern   Not on file  Social History Narrative   Lives at home with husband and cat   Left handed   Caffeine: frappe occasionally    Social Determinants of Health   Financial Resource Strain: Not on file  Food Insecurity: Not on file  Transportation Needs: Not on file  Physical Activity: Not on file  Stress: Not on file  Social Connections: Not on file  Intimate Partner Violence: Not on file    Past Surgical History:  Procedure Laterality Date   APPENDECTOMY  04/07/2011   CHOLECYSTECTOMY  10/18/2011   Procedure: LAPAROSCOPIC CHOLECYSTECTOMY WITH INTRAOPERATIVE CHOLANGIOGRAM;  Surgeon: Emelia Loron, MD;  Location: [facility] OR;  Service: General;  Laterality: N/A;   IR ANGIO INTRA EXTRACRAN SEL COM CAROTID INNOMINATE UNI R MOD SED  06/02/2019   IR ANGIO INTRA EXTRACRAN SEL INTERNAL CAROTID UNI L MOD SED  06/02/2019   IR ANGIO VERTEBRAL SEL VERTEBRAL UNI R MOD SED  06/02/2019   IR US GUIDE VASC ACCESS RIGHT  06/02/2019   TONSILLECTOMY     WISDOM TOOTH EXTRACTION      Family History  Problem Relation Age of Onset   Breast cancer Mother        mets to spine   Asthma Mother    Asthma  Father    Diabetes type II Sister    Breast cancer Maternal Grandmother    Diabetes Maternal Grandmother    Rheum arthritis Maternal Grandmother    Hypertension Maternal Grandfather    Diabetes Paternal Grandfather    Ovarian cancer Maternal Aunt    Ovarian cancer Paternal Aunt    Colon cancer Neg Hx     Allergies  Allergen Reactions   Dairy Aid [Tilactase] Anaphylaxis   Other Hives and Anaphylaxis     shrimp, dairy products, shellfish (Swelling) Other reaction(s): Hives   Shellfish Allergy Anaphylaxis   Benadryl [Diphenhydramine Hcl] Hives   Iodine Hives and Rash   Tape Rash    Current Outpatient Medications on File Prior to Visit  Medication Sig Dispense Refill   albuterol (VENTOLIN HFA) 108 (90 Base) MCG/ACT inhaler Inhale 2 puffs into the  lungs every 4 (four) hours as needed for wheezing or shortness of breath (coughing). 18 g 2   clomiPHENE (CLOMID) 50 MG tablet Take 1 tablet by mouth every day 5 tablet 0   fluticasone (FLONASE) 50 MCG/ACT nasal spray Place 1 spray into both nostrils 2 (two) times daily as needed for rhinitis. 16 g 5   Galcanezumab-gnlm (EMGALITY) 120 MG/ML SOAJ Inject 1 mL into the skin every 30 (thirty) days. 1 mL 8   hydrocortisone (ANUSOL-HC) 25 MG suppository Unwrap and place 1 suppository rectally 2  times daily. 12 suppository 0   Insulin Pen Needle (UNIFINE PENTIPS) 31G X 6 MM MISC Use as directed with Saxenda 100 each 1   levocetirizine (XYZAL) 5 MG tablet Take 1 tablet (5 mg total) by mouth every evening. 30 tablet 5   Liraglutide -Weight Management (SAXENDA) 18 MG/3ML SOPN Start with injecting 0.6 mg daily for 1 week, 1.2 mg daily for the next week, 1.8 mg daily for next week. If tolerated, can further increase to 2.4 mg daily. Max dose: 3 mg/daily 15 mL 3   montelukast (SINGULAIR) 10 MG tablet Take 1 tablet (10 mg total) by mouth at bedtime. 30 tablet 5   Rimegepant Sulfate (NURTEC) 75 MG TBDP Dissolve 1 tablet by mouth once as needed. May repeat once after 2 hours.  No more than 2 tablets in 24 hours. 10 tablet 3   sertraline (ZOLOFT) 100 MG tablet Take 1 tablet by mouth daily for anxiety and depression 90 tablet 3   No current facility-administered medications on file prior to visit.    BP 110/74    Temp 98.6 F (37 C) (Temporal)    Ht 5' 5.5" (1.664 m)    Wt 287 lb (130.2 kg)    BMI 47.03 kg/m  Objective:   Physical Exam Cardiovascular:     Rate and Rhythm: Normal rate and regular rhythm.  Pulmonary:     Effort: Pulmonary effort is normal.     Breath sounds: Normal breath sounds.  Musculoskeletal:     Cervical back: Neck supple.  Skin:    General: Skin is warm and dry.          Assessment & Plan:      This visit occurred during the SARS-CoV-2 public health emergency.  Safety  protocols were in place, including screening questions prior to the visit, additional usage of staff PPE, and extensive cleaning of exam room while observing appropriate contact time as indicated for disinfecting solutions.

## 2021-05-12 NOTE — Patient Instructions (Signed)
We are changing your treatment regimen for anxiety and depression.  Reduce your sertraline (Zoloft) tablet to 50 mg and take this once daily for 1 week then stop.  At the same time start venlafaxine (Effexor) ER 37.5 mg and take 1 capsule daily for 1 week, then increase to 2 capsules once daily thereafter.  You may take hydroxyzine 10 mg twice daily as needed for anxiety/panic attacks.  This may cause drowsiness.  Please set up a follow-up visit for 1 month.  It was a pleasure to see you today!

## 2021-05-16 ENCOUNTER — Other Ambulatory Visit: Payer: No Typology Code available for payment source

## 2021-05-16 ENCOUNTER — Other Ambulatory Visit: Payer: Self-pay

## 2021-05-16 ENCOUNTER — Ambulatory Visit: Payer: No Typology Code available for payment source | Admitting: Family Medicine

## 2021-05-16 ENCOUNTER — Ambulatory Visit (INDEPENDENT_AMBULATORY_CARE_PROVIDER_SITE_OTHER): Payer: No Typology Code available for payment source

## 2021-05-16 ENCOUNTER — Telehealth: Payer: Self-pay

## 2021-05-16 DIAGNOSIS — Z23 Encounter for immunization: Secondary | ICD-10-CM | POA: Diagnosis not present

## 2021-05-16 NOTE — Telephone Encounter (Signed)
Technically she is up-to-date.  If she has had a Tdap previously (younger years) then the Td is enough to booster her.  Is she requesting a Tdap?  What is the reason?

## 2021-05-16 NOTE — Progress Notes (Signed)
Per orders of Mayra Reel, AGNP-C, injection of TDAP given by Donnamarie Poag in right deltoid. Patient tolerated injection well. Patient provided copy of updated immunization report and VIS

## 2021-05-16 NOTE — Telephone Encounter (Signed)
Reviewed with Anda Kraft ok to give patient TDAP as requested.

## 2021-05-16 NOTE — Telephone Encounter (Signed)
Patient had TD in 2020 is she ok to have TDAP now?

## 2021-05-18 ENCOUNTER — Other Ambulatory Visit: Payer: Self-pay

## 2021-05-18 ENCOUNTER — Other Ambulatory Visit: Payer: No Typology Code available for payment source

## 2021-05-25 ENCOUNTER — Other Ambulatory Visit (INDEPENDENT_AMBULATORY_CARE_PROVIDER_SITE_OTHER): Payer: Self-pay | Admitting: Primary Care

## 2021-05-25 ENCOUNTER — Other Ambulatory Visit (HOSPITAL_COMMUNITY): Payer: Self-pay

## 2021-05-25 ENCOUNTER — Other Ambulatory Visit: Payer: Self-pay | Admitting: Primary Care

## 2021-05-25 DIAGNOSIS — E669 Obesity, unspecified: Secondary | ICD-10-CM

## 2021-05-25 DIAGNOSIS — K649 Unspecified hemorrhoids: Secondary | ICD-10-CM

## 2021-05-25 NOTE — Telephone Encounter (Signed)
Requested medication (s) are due for refill today:  ? ?Requested medication (s) are on the active medication list: yes   ? ?Last refill: 12/26/20  15 ml  3 refills ? ?Future visit scheduled no ? ?Notes to clinic:Titrated med, please review. Thank you. ? ?Requested Prescriptions  ?Pending Prescriptions Disp Refills  ? Liraglutide -Weight Management (SAXENDA) 18 MG/3ML SOPN 15 mL 3  ?  Sig: Start with injecting 0.6 mg daily for 1 week, 1.2 mg daily for the next week, 1.8 mg daily for next week. If tolerated, can further increase to 2.4 mg daily. Max dose: 3 mg/daily  ?  ? Endocrinology:  Diabetes - GLP-1 Receptor Agonists Failed - 05/25/2021  6:32 AM  ?  ?  Failed - Valid encounter within last 6 months  ?  Recent Outpatient Visits   ? ?      ? 7 months ago Anxiety and depression  ? [facility] Grayce Sessions, NP  ? 8 months ago Migraine without aura and without status migrainosus, not intractable  ? [facility] Grayce Sessions, NP  ? 1 year ago Nausea without vomiting  ? [facility], Azalee Course, FNP  ? 1 year ago Chronic migraine without aura, with intractable migraine, so stated, with status migrainosus  ? [facility] [facility], Azalee Course, FNP  ? 1 year ago Annual physical exam  ? [facility], Meda Coffee, MD  ? ?  ?  ? ?  ?  ?  Passed - HBA1C is between 0 and 7.9 and within 180 days  ?  Hgb A1c MFr Bld  ?Date Value Ref Range Status  ?12/27/2020 5.7 4.6 - 6.5 % Final  ?  Comment:  ?  Glycemic Control Guidelines for People with Diabetes:Non Diabetic:  <6%Goal of Therapy: <7%Additional Action Suggested:  >8%   ?  ?  ?  ?  ? ? ? ? ?

## 2021-05-25 NOTE — Telephone Encounter (Signed)
Sent to PCP ?

## 2021-05-26 ENCOUNTER — Other Ambulatory Visit (HOSPITAL_COMMUNITY): Payer: Self-pay

## 2021-05-26 MED ORDER — HYDROCORTISONE ACETATE 25 MG RE SUPP
25.0000 mg | Freq: Two times a day (BID) | RECTAL | 0 refills | Status: DC
  Filled 2021-05-26: qty 12, 6d supply, fill #0

## 2021-05-28 DIAGNOSIS — Z6841 Body Mass Index (BMI) 40.0 and over, adult: Secondary | ICD-10-CM

## 2021-05-29 ENCOUNTER — Other Ambulatory Visit (HOSPITAL_COMMUNITY): Payer: Self-pay

## 2021-05-31 ENCOUNTER — Other Ambulatory Visit (HOSPITAL_COMMUNITY): Payer: Self-pay

## 2021-05-31 MED ORDER — SEMAGLUTIDE-WEIGHT MANAGEMENT 0.5 MG/0.5ML ~~LOC~~ SOAJ
0.5000 mg | SUBCUTANEOUS | 0 refills | Status: DC
  Filled 2021-05-31: qty 2, 28d supply, fill #0

## 2021-06-03 ENCOUNTER — Other Ambulatory Visit (HOSPITAL_COMMUNITY): Payer: Self-pay

## 2021-06-17 ENCOUNTER — Other Ambulatory Visit (HOSPITAL_COMMUNITY): Payer: Self-pay

## 2021-06-21 ENCOUNTER — Telehealth: Payer: Self-pay

## 2021-06-21 DIAGNOSIS — R7303 Prediabetes: Secondary | ICD-10-CM

## 2021-06-21 DIAGNOSIS — E282 Polycystic ovarian syndrome: Secondary | ICD-10-CM

## 2021-06-24 ENCOUNTER — Other Ambulatory Visit (HOSPITAL_COMMUNITY): Payer: Self-pay

## 2021-06-26 ENCOUNTER — Other Ambulatory Visit (HOSPITAL_COMMUNITY): Payer: Self-pay

## 2021-06-27 ENCOUNTER — Encounter: Payer: Self-pay | Admitting: *Deleted

## 2021-06-27 DIAGNOSIS — G43019 Migraine without aura, intractable, without status migrainosus: Secondary | ICD-10-CM

## 2021-06-27 NOTE — Telephone Encounter (Signed)
Completed Emgality renewal PA on Cover My Meds. Key: [id number]. Awaiting determination from [facility].  ?

## 2021-06-29 NOTE — Telephone Encounter (Signed)
Fax received needing additional information.  I went back on CMM and answered additional questions.  Sent to plan.  Determination pending.  (Up to 5 days).  ?

## 2021-07-01 ENCOUNTER — Other Ambulatory Visit (HOSPITAL_COMMUNITY): Payer: Self-pay

## 2021-07-03 ENCOUNTER — Encounter: Payer: Self-pay | Admitting: *Deleted

## 2021-07-04 NOTE — Telephone Encounter (Signed)
Received denial for Emgality, pt has tried ajovy, topiramate, amitriptyline, maxalt, nurtec.(Plus others per Dr. Neale Burly). Appeal done to be signed by Dr. Frances Furbish then will fax.  ?

## 2021-07-12 ENCOUNTER — Telehealth: Payer: Commercial Managed Care - PPO | Admitting: Physician Assistant

## 2021-07-12 ENCOUNTER — Other Ambulatory Visit (HOSPITAL_COMMUNITY): Payer: Self-pay

## 2021-07-12 DIAGNOSIS — G43109 Migraine with aura, not intractable, without status migrainosus: Secondary | ICD-10-CM

## 2021-07-12 MED ORDER — IBUPROFEN 800 MG PO TABS
800.0000 mg | ORAL_TABLET | Freq: Three times a day (TID) | ORAL | 0 refills | Status: DC | PRN
  Filled 2021-07-12: qty 30, 10d supply, fill #0

## 2021-07-12 MED ORDER — PREDNISONE 10 MG (21) PO TBPK
ORAL_TABLET | ORAL | 0 refills | Status: DC
Start: 2021-07-12 — End: 2022-02-14
  Filled 2021-07-12: qty 21, 6d supply, fill #0

## 2021-07-12 NOTE — Progress Notes (Signed)
?Virtual Visit Consent  ? ?Felicia Salinas, you are scheduled for a virtual visit with a [facility] provider today.   ?  ?Just as with appointments in the office, your consent must be obtained to participate.  Your consent will be active for this visit and any virtual visit you may have with one of our providers in the next 365 days.   ?  ?If you have a MyChart account, a copy of this consent can be sent to you electronically.  All virtual visits are billed to your insurance company just like a traditional visit in the office.   ? ?As this is a virtual visit, video technology does not allow for your provider to perform a traditional examination.  This may limit your provider's ability to fully assess your condition.  If your provider identifies any concerns that need to be evaluated in person or the need to arrange testing (such as labs, EKG, etc.), we will make arrangements to do so.   ?  ?Although advances in technology are sophisticated, we cannot ensure that it will always work on either your end or our end.  If the connection with a video visit is poor, the visit may have to be switched to a telephone visit.  With either a video or telephone visit, we are not always able to ensure that we have a secure connection.    ? ?I need to obtain your verbal consent now.   Are you willing to proceed with your visit today?  ?  ?Felicia Salinas has provided verbal consent on 07/12/2021 for a virtual visit (video or telephone). ?  ?Felicia Loveless, PA-C  ? ?Date: 07/12/2021 12:58 PM ? ? ?Virtual Visit via Video Note  ? ?Felicia Salinas, connected with  Felicia Salinas  ([id number], 1990-07-16) on 07/12/21 at 12:45 PM EDT by a video-enabled telemedicine application and verified that I am speaking with the correct person using two identifiers. ? ?Location: ?Patient: Virtual Visit Location Patient: Home ?Provider: Virtual Visit Location Provider: Home Office ?  ?I discussed the limitations of evaluation and management by  telemedicine and the availability of in person appointments. The patient expressed understanding and agreed to proceed.   ? ?History of Present Illness: ?Felicia Salinas is a 31 y.o. who identifies as a female who was assigned female at birth, and is being seen today for migraines. Has long, uncontrolled history of migraines. Has tried and failed multiple options for prevention and abortion. Is followed by Neurology. Most recent regimen was Emgality and Nurtec. Currently out of Emgality as insurance denied and is in process of appeal for coverage. Since being out of Emgality has been having more frequent migraines. Nurtec does not work well as abortive treatment for her and migraine has persisted. She has been using Zofran for associated nausea. ? ? ?Problems:  ?Patient Active Problem List  ? Diagnosis Date Noted  ? Hemorrhoids 04/18/2021  ? Morbid obesity with BMI of 45.0-49.9, adult (HCC) 12/27/2020  ? Anxiety and depression 12/27/2020  ? Other allergic rhinitis 07/04/2020  ? Lactose intolerance 07/04/2020  ? Adverse reaction to food, subsequent encounter 07/04/2020  ? Urticaria 07/04/2020  ? Hirsutism 05/06/2020  ? Infertility, female 05/06/2020  ? Empty sella turcica (HCC) 05/06/2020  ? Chronic migraine without aura, with intractable migraine, so stated, with status migrainosus 10/16/2019  ? OSA (obstructive sleep apnea) 10/16/2019  ? Insomnia 10/16/2019  ? Asthma 09/29/2018  ? Polycystic ovary syndrome 09/29/2018  ?  ?Allergies:  ?Allergies  ?  Allergen Reactions  ? Dairy Aid [Tilactase] Anaphylaxis  ? Other Hives and Anaphylaxis  ?   shrimp, dairy products, shellfish (Swelling) ?Other reaction(s): Hives  ? Shellfish Allergy Anaphylaxis  ? Benadryl [Diphenhydramine Hcl] Hives  ? Iodine Hives and Rash  ? Tape Rash  ? ?Medications:  ?Current Outpatient Medications:  ?  ibuprofen (ADVIL) 800 MG tablet, Take 1 tablet (800 mg total) by mouth every 8 (eight) hours as needed., Disp: 30 tablet, Rfl: 0 ?  predniSONE  (STERAPRED UNI-PAK 21 TAB) 10 MG (21) TBPK tablet, 6 day taper; take as directed on package instructions, Disp: 21 tablet, Rfl: 0 ?  albuterol (VENTOLIN HFA) 108 (90 Base) MCG/ACT inhaler, Inhale 2 puffs into the lungs every 4 (four) hours as needed for wheezing or shortness of breath (coughing)., Disp: 18 g, Rfl: 2 ?  clomiPHENE (CLOMID) 50 MG tablet, Take 1 tablet by mouth every day, Disp: 5 tablet, Rfl: 0 ?  fluticasone (FLONASE) 50 MCG/ACT nasal spray, Place 1 spray into both nostrils 2 (two) times daily as needed for rhinitis., Disp: 16 g, Rfl: 5 ?  Galcanezumab-gnlm (EMGALITY) 120 MG/ML SOAJ, Inject 1 mL into the skin every 30 (thirty) days., Disp: 1 mL, Rfl: 8 ?  hydrocortisone (ANUSOL-HC) 25 MG suppository, Unwrap and place 1 suppository rectally 2  times daily., Disp: 12 suppository, Rfl: 0 ?  hydrOXYzine (ATARAX) 10 MG tablet, Take 1 tablet by mouth 2 (two) times daily as needed for anxiety., Disp: 60 tablet, Rfl: 0 ?  Insulin Pen Needle (UNIFINE PENTIPS) 31G X 6 MM MISC, Use as directed with Saxenda, Disp: 100 each, Rfl: 1 ?  levocetirizine (XYZAL) 5 MG tablet, Take 1 tablet (5 mg total) by mouth every evening., Disp: 30 tablet, Rfl: 5 ?  montelukast (SINGULAIR) 10 MG tablet, Take 1 tablet (10 mg total) by mouth at bedtime., Disp: 30 tablet, Rfl: 5 ?  Rimegepant Sulfate (NURTEC) 75 MG TBDP, Dissolve 1 tablet by mouth once as needed. May repeat once after 2 hours.  No more than 2 tablets in 24 hours., Disp: 10 tablet, Rfl: 3 ?  Semaglutide-Weight Management 0.5 MG/0.5ML SOAJ, Inject 0.5 mg into the skin once a week., Disp: 6 mL, Rfl: 0 ?  venlafaxine XR (EFFEXOR XR) 37.5 MG 24 hr capsule, Take 1 capsule by mouth once daily for 1 week, then increase to 2 capsules by mouth daily thereafter for anxiety depression., Disp: 180 capsule, Rfl: 0 ? ?Observations/Objective: ?Patient is well-developed, well-nourished in no acute distress.  ?Resting comfortably at home.  ?Head is normocephalic, atraumatic.  ?No  labored breathing.  ?Speech is clear and coherent with logical content.  ?Patient is alert and oriented at baseline.  ? ? ?Assessment and Plan: ?1. Migraine with aura and without status migrainosus, not intractable ?- ibuprofen (ADVIL) 800 MG tablet; Take 1 tablet (800 mg total) by mouth every 8 (eight) hours as needed.  Dispense: 30 tablet; Refill: 0 ?- predniSONE (STERAPRED UNI-PAK 21 TAB) 10 MG (21) TBPK tablet; 6 day taper; take as directed on package instructions  Dispense: 21 tablet; Refill: 0 ? ?- Prednisone for chronic migraine ?- Ibuprofen can be used after prednisone completed, limit use due to rebound headaches ?- May take Tylenol with prednisone (again limit due to rebound headache risk) ?- Push fluids ?- Follow up with Neurology for further migraine management ? ?Follow Up Instructions: ?I discussed the assessment and treatment plan with the patient. The patient was provided an opportunity to ask questions and all were answered.  The patient agreed with the plan and demonstrated an understanding of the instructions.  A copy of instructions were sent to the patient via MyChart unless otherwise noted below.  ? ? ?The patient was advised to call back or seek an in-person evaluation if the symptoms worsen or if the condition fails to improve as anticipated. ? ?Time:  ?I spent 15 minutes with the patient via telehealth technology discussing the above problems/concerns.   ? ?Felicia Loveless, PA-C ?

## 2021-07-12 NOTE — Patient Instructions (Signed)
?Felicia Salinas, thank you for joining [name], PA-C for today's virtual visit.  While this provider is not your primary care provider (PCP), if your PCP is located in our provider database this encounter information will be shared with them immediately following your visit. ? ?Consent: ?(Patient) Kennidi Yoshida provided verbal consent for this virtual visit at the beginning of the encounter. ? ?Current Medications: ? ?Current Outpatient Medications:  ?  ibuprofen (ADVIL) 800 MG tablet, Take 1 tablet  by mouth every 8 hours as needed., Disp: 30 tablet, Rfl: 0 ?  predniSONE (STERAPRED UNI-PAK 21 TAB) 10 MG (21) TBPK tablet, 6 day taper; take as directed on package instructions, Disp: 21 tablet, Rfl: 0 ?  albuterol (VENTOLIN HFA) 108 (90 Base) MCG/ACT inhaler, Inhale 2 puffs into the lungs every 4 (four) hours as needed for wheezing or shortness of breath (coughing)., Disp: 18 g, Rfl: 2 ?  clomiPHENE (CLOMID) 50 MG tablet, Take 1 tablet by mouth every day, Disp: 5 tablet, Rfl: 0 ?  fluticasone (FLONASE) 50 MCG/ACT nasal spray, Place 1 spray into both nostrils 2 (two) times daily as needed for rhinitis., Disp: 16 g, Rfl: 5 ?  Galcanezumab-gnlm (EMGALITY) 120 MG/ML SOAJ, Inject 1 mL into the skin every 30 (thirty) days., Disp: 1 mL, Rfl: 8 ?  hydrocortisone (ANUSOL-HC) 25 MG suppository, Unwrap and place 1 suppository rectally 2  times daily., Disp: 12 suppository, Rfl: 0 ?  hydrOXYzine (ATARAX) 10 MG tablet, Take 1 tablet by mouth 2 (two) times daily as needed for anxiety., Disp: 60 tablet, Rfl: 0 ?  Insulin Pen Needle (UNIFINE PENTIPS) 31G X 6 MM MISC, Use as directed with Saxenda, Disp: 100 each, Rfl: 1 ?  levocetirizine (XYZAL) 5 MG tablet, Take 1 tablet (5 mg total) by mouth every evening., Disp: 30 tablet, Rfl: 5 ?  montelukast (SINGULAIR) 10 MG tablet, Take 1 tablet (10 mg total) by mouth at bedtime., Disp: 30 tablet, Rfl: 5 ?  Rimegepant Sulfate (NURTEC) 75 MG TBDP, Dissolve 1 tablet by mouth once as  needed. May repeat once after 2 hours.  No more than 2 tablets in 24 hours., Disp: 10 tablet, Rfl: 3 ?  Semaglutide-Weight Management 0.5 MG/0.5ML SOAJ, Inject 0.5 mg into the skin once a week., Disp: 6 mL, Rfl: 0 ?  venlafaxine XR (EFFEXOR XR) 37.5 MG 24 hr capsule, Take 1 capsule by mouth once daily for 1 week, then increase to 2 capsules by mouth daily thereafter for anxiety depression., Disp: 180 capsule, Rfl: 0  ? ?Medications ordered in this encounter:  ?Meds ordered this encounter  ?Medications  ? ibuprofen (ADVIL) 800 MG tablet  ?  Sig: Take 1 tablet  by mouth every 8 hours as needed.  ?  Dispense:  30 tablet  ?  Refill:  0  ?  Order Specific Question:   Supervising Provider  ?  Answer:   Eber Hong [[id number]]  ? predniSONE (STERAPRED UNI-PAK 21 TAB) 10 MG (21) TBPK tablet  ?  Sig: 6 day taper; take as directed on package instructions  ?  Dispense:  21 tablet  ?  Refill:  0  ?  Order Specific Question:   Supervising Provider  ?  Answer:   Eber Hong [[id number]]  ?  ? ?*If you need refills on other medications prior to your next appointment, please contact your pharmacy* ? ?Follow-Up: ?Call back or seek an in-person evaluation if the symptoms worsen or if the condition fails to improve as anticipated. ? ?  Other Instructions ?Migraine Headache ?A migraine headache is an intense, throbbing pain on one side or both sides of the head. Migraine headaches may also cause other symptoms, such as nausea, vomiting, and sensitivity to light and noise. A migraine headache can last from 4 hours to 3 days. Talk with your doctor about what things may bring on (trigger) your migraine headaches. ?What are the causes? ?The exact cause of this condition is not known. However, a migraine may be caused when nerves in the brain become irritated and release chemicals that cause inflammation of blood vessels. This inflammation causes pain. This condition may be triggered or caused by: ?Drinking alcohol. ?Smoking. ?Taking medicines, such  as: ?Medicine used to treat chest pain (nitroglycerin). ?Birth control pills. ?Estrogen. ?Certain blood pressure medicines. ?Eating or drinking products that contain nitrates, glutamate, aspartame, or tyramine. Aged cheeses, chocolate, or caffeine may also be triggers. ?Doing physical activity. ?Other things that may trigger a migraine headache include: ?Menstruation. ?Pregnancy. ?Hunger. ?Stress. ?Lack of sleep or too much sleep. ?Weather changes. ?Fatigue. ?What increases the risk? ?The following factors may make you more likely to experience migraine headaches: ?Being a certain age. This condition is more common in people who are 2625-33 years old. ?Being female. ?Having a family history of migraine headaches. ?Being Caucasian. ?Having a mental health condition, such as depression or anxiety. ?Being obese. ?What are the signs or symptoms? ?The main symptom of this condition is pulsating or throbbing pain. This pain may: ?Happen in any area of the head, such as on one side or both sides. ?Interfere with daily activities. ?Get worse with physical activity. ?Get worse with exposure to bright lights or loud noises. ?Other symptoms may include: ?Nausea. ?Vomiting. ?Dizziness. ?General sensitivity to bright lights, loud noises, or smells. ?Before you get a migraine headache, you may get warning signs (an aura). An aura may include: ?Seeing flashing lights or having blind spots. ?Seeing bright spots, halos, or zigzag lines. ?Having tunnel vision or blurred vision. ?Having numbness or a tingling feeling. ?Having trouble talking. ?Having muscle weakness. ?Some people have symptoms after a migraine headache (postdromal phase), such as: ?Feeling tired. ?Difficulty concentrating. ?How is this diagnosed? ?A migraine headache can be diagnosed based on: ?Your symptoms. ?A physical exam. ?Tests, such as: ?CT scan or an MRI of the head. These imaging tests can help rule out other causes of headaches. ?Taking fluid from the spine  (lumbar puncture) and analyzing it (cerebrospinal fluid analysis, or CSF analysis). ?How is this treated? ?This condition may be treated with medicines that: ?Relieve pain. ?Relieve nausea. ?Prevent migraine headaches. ?Treatment for this condition may also include: ?Acupuncture. ?Lifestyle changes like avoiding foods that trigger migraine headaches. ?Biofeedback. ?Cognitive behavioral therapy. ?Follow these instructions at home: ?Medicines ?Take over-the-counter and prescription medicines only as told by your health care provider. ?Ask your health care provider if the medicine prescribed to you: ?Requires you to avoid driving or using heavy machinery. ?Can cause constipation. You may need to take these actions to prevent or treat constipation: ?Drink enough fluid to keep your urine pale yellow. ?Take over-the-counter or prescription medicines. ?Eat foods that are high in fiber, such as beans, whole grains, and fresh fruits and vegetables. ?Limit foods that are high in fat and processed sugars, such as fried or sweet foods. ?Lifestyle ?Do not drink alcohol. ?Do not use any products that contain nicotine or tobacco, such as cigarettes, e-cigarettes, and chewing tobacco. If you need help quitting, ask your health care provider. ?Get at  least 8 hours of sleep every night. ?Find ways to manage stress, such as meditation, deep breathing, or yoga. ?General instructions ? ?  ? ?Keep a journal to find out what may trigger your migraine headaches. For example, write down: ?What you eat and drink. ?How much sleep you get. ?Any change to your diet or medicines. ?If you have a migraine headache: ?Avoid things that make your symptoms worse, such as bright lights. ?It may help to lie down in a dark, quiet room. ?Do not drive or use heavy machinery. ?Ask your health care provider what activities are safe for you while you are experiencing symptoms. ?Keep all follow-up visits as told by your health care provider. This is  important. ?Contact a health care provider if: ?You develop symptoms that are different or more severe than your usual migraine headache symptoms. ?You have more than 15 headache days in one month. ?Get help rig

## 2021-07-12 NOTE — Progress Notes (Signed)
Patient rescheduled for 12:45 at time of her 11:15 visit.  ?

## 2021-07-12 NOTE — Telephone Encounter (Signed)
Received from from new insurance to submit authorization for patient. I have done so and it was denied. Have placed fax in your box for review.  ?

## 2021-07-13 NOTE — Telephone Encounter (Signed)
Spoke w/ pt informed that he insurance does cover meds , advised pt to call her insurance , pt said that she is willing to try go nutritionist  and call her insurance  ?

## 2021-07-13 NOTE — Addendum Note (Signed)
Addended by: Pleas Koch on: 07/13/2021 11:28 AM ? ? Modules accepted: Orders ? ?

## 2021-07-13 NOTE — Telephone Encounter (Signed)
Please notify patient that her Rx was denied.  ?She can call her insurance to find out what they will cover. I'm also happy to send her to a nutritionist.  ? ?Let me know. ?

## 2021-07-13 NOTE — Telephone Encounter (Signed)
Noted.  Referral placed to nutritionist. ?

## 2021-07-15 ENCOUNTER — Other Ambulatory Visit (HOSPITAL_COMMUNITY): Payer: Self-pay

## 2021-08-04 ENCOUNTER — Ambulatory Visit: Payer: No Typology Code available for payment source | Admitting: Primary Care

## 2021-08-05 ENCOUNTER — Other Ambulatory Visit: Payer: Self-pay | Admitting: Primary Care

## 2021-08-05 ENCOUNTER — Other Ambulatory Visit (HOSPITAL_COMMUNITY): Payer: Self-pay

## 2021-08-05 DIAGNOSIS — K649 Unspecified hemorrhoids: Secondary | ICD-10-CM

## 2021-08-05 DIAGNOSIS — F419 Anxiety disorder, unspecified: Secondary | ICD-10-CM

## 2021-08-06 MED ORDER — VENLAFAXINE HCL ER 37.5 MG PO CP24
75.0000 mg | ORAL_CAPSULE | Freq: Every day | ORAL | 0 refills | Status: DC
  Filled 2021-08-06: qty 60, 30d supply, fill #0

## 2021-08-06 MED ORDER — HYDROCORTISONE ACETATE 25 MG RE SUPP
25.0000 mg | Freq: Two times a day (BID) | RECTAL | 0 refills | Status: DC
  Filled 2021-08-06 – 2021-08-07 (×2): qty 6, 3d supply, fill #0
  Filled 2021-09-01: qty 12, 6d supply, fill #0

## 2021-08-07 ENCOUNTER — Other Ambulatory Visit (HOSPITAL_COMMUNITY): Payer: Self-pay

## 2021-08-08 ENCOUNTER — Other Ambulatory Visit (HOSPITAL_COMMUNITY): Payer: Self-pay

## 2021-08-08 MED ORDER — AIMOVIG 140 MG/ML ~~LOC~~ SOAJ
140.0000 mg | SUBCUTANEOUS | 5 refills | Status: DC
  Filled 2021-08-08: qty 1, 30d supply, fill #0
  Filled 2021-10-02: qty 1, 30d supply, fill #1
  Filled 2021-12-15 – 2022-02-18 (×3): qty 1, 30d supply, fill #2

## 2021-08-08 NOTE — Telephone Encounter (Addendum)
Received denial for Ajovy on appeal, Aimovig must be tried and failed prio to starting [employer]  She was last seen 09-2020, had several appts since but cancelled.  Ok to proceed or see prior to change in medication.  ?

## 2021-08-08 NOTE — Telephone Encounter (Signed)
Aimovig 140 mg SQ every 30 days prescribed per v.o. Dr Rexene Alberts. Patient has been notified and Emgality has been canceled.  ?

## 2021-08-08 NOTE — Addendum Note (Signed)
Addended by: Bertram Savin on: 08/08/2021 02:21 PM ? ? Modules accepted: Orders ? ?

## 2021-08-09 ENCOUNTER — Other Ambulatory Visit (HOSPITAL_COMMUNITY): Payer: Self-pay

## 2021-08-16 ENCOUNTER — Other Ambulatory Visit (HOSPITAL_COMMUNITY): Payer: Self-pay

## 2021-08-17 ENCOUNTER — Ambulatory Visit: Payer: No Typology Code available for payment source | Admitting: Primary Care

## 2021-08-25 ENCOUNTER — Other Ambulatory Visit (HOSPITAL_COMMUNITY): Payer: Self-pay

## 2021-08-28 NOTE — Telephone Encounter (Signed)
Initiated CCM Key (Key: [id number]) Aimovig 140mg /54ml 5 days for determination.

## 2021-09-01 ENCOUNTER — Ambulatory Visit (INDEPENDENT_AMBULATORY_CARE_PROVIDER_SITE_OTHER): Payer: Commercial Managed Care - PPO | Admitting: Primary Care

## 2021-09-01 ENCOUNTER — Other Ambulatory Visit (HOSPITAL_COMMUNITY): Payer: Self-pay

## 2021-09-01 ENCOUNTER — Encounter: Payer: Self-pay | Admitting: Primary Care

## 2021-09-01 VITALS — BP 120/68 | HR 101 | Temp 98.6°F | Ht 65.5 in | Wt 292.0 lb

## 2021-09-01 DIAGNOSIS — R7303 Prediabetes: Secondary | ICD-10-CM | POA: Insufficient documentation

## 2021-09-01 DIAGNOSIS — F419 Anxiety disorder, unspecified: Secondary | ICD-10-CM

## 2021-09-01 DIAGNOSIS — K649 Unspecified hemorrhoids: Secondary | ICD-10-CM

## 2021-09-01 DIAGNOSIS — G43711 Chronic migraine without aura, intractable, with status migrainosus: Secondary | ICD-10-CM

## 2021-09-01 DIAGNOSIS — F32A Depression, unspecified: Secondary | ICD-10-CM

## 2021-09-01 DIAGNOSIS — Z6841 Body Mass Index (BMI) 40.0 and over, adult: Secondary | ICD-10-CM

## 2021-09-01 LAB — POCT GLYCOSYLATED HEMOGLOBIN (HGB A1C): Hemoglobin A1C: 5.3 % (ref 4.0–5.6)

## 2021-09-01 MED ORDER — VENLAFAXINE HCL ER 150 MG PO CP24
150.0000 mg | ORAL_CAPSULE | Freq: Every day | ORAL | 0 refills | Status: DC
  Filled 2021-09-01 – 2021-12-15 (×2): qty 30, 30d supply, fill #0
  Filled 2022-02-21: qty 30, 30d supply, fill #1
  Filled 2022-03-30: qty 30, 30d supply, fill #2

## 2021-09-01 MED ORDER — HYDROCORTISONE ACETATE 25 MG RE SUPP
25.0000 mg | Freq: Two times a day (BID) | RECTAL | 0 refills | Status: DC
  Filled 2021-09-19 – 2021-12-15 (×2): qty 12, 6d supply, fill #0

## 2021-09-01 NOTE — Assessment & Plan Note (Signed)
Uncontrolled as they are recurrent. Recommended GI evaluation for which she declines.  Discussed to start a stool softener 1-2 times daily, avoid straining with bowel movements.  Refill provided for hydrocortisone suppositories.

## 2021-09-01 NOTE — Progress Notes (Signed)
Subjective:    Patient ID: Felicia Salinas, female    DOB: [birth year], 31 y.o.   MRN: [id number]  HPI  Felicia Salinas is a very pleasant 31 y.o. female with a history of chronic migraines, OSA, PCOS, infertility, morbid obesity, anxiety depression who presents today for follow-up of chronic conditions.  1) Morbid Obesity:   Initiated on Saxenda daily injections for morbid obesity in October 2022.  During her follow-up visit in December 2022 she had lost 11 pounds.  She had also improved her diet and began walking.  In March 2023 she reached out to [place] requesting to change Saxenda to something else as it was causing GI upset so a prescription for [facility] was sent to her pharmacy.  A prior authorization was required for [facility] for which was not approved.  Today she endorses a decreased appetite, can go 2 days without eating anything and still not feel hungry. Sometimes she'll make something and not end up eating it. When she does eat, she will snack frequently throughout the day.  She has been working on making better snacking choices. She is not exercising much.   Wt Readings from Last 3 Encounters:  09/01/21 292 lb (132.5 kg)  05/12/21 287 lb (130.2 kg)  04/18/21 292 lb (132.5 kg)   2) Migraines: Following with neurology and is currently managed on Amovig 140 mg once monthly as of today. She had to switch from [facility] as it was not effective. She is also managed on Nurtec but finds that Ibuprofen 800 mg is more effective.   She is scheduled for follow up in July 2023.  3) Anxiety and Depression: Currently managed on venlafaxine ER 75 mg daily. Overall she has noticed some improvement, but she continues to experience anxiety with symptoms of feeling jittery, constant worry, little motivation to do things.   She denies SI/HI, GI upset, nausea.   4) Chronic Hemorrhoids: Chronic for the last 6 months, wax and wane in intensity. Symptoms include rectal itching, bright red rectal bleeding,  rectal irritation. She's been using rectal suppositories with temporary improvement.   She has never seen GI. She does strain with bowel movements as stools are firm. She does not take a stool softener. She is needing a refill of her suppositories.   Review of Systems  Gastrointestinal:        Hemorrhoids   Neurological:  Positive for headaches.  Psychiatric/Behavioral:  The patient is nervous/anxious.          Past Medical History:  Diagnosis Date   Angio-edema    Anxiety    Asthma    controlled per patient last attack in January   Environmental allergies    Obesity    Urticaria     Social History   Socioeconomic History   Marital status: Married    Spouse name: Not on file   Number of children: 0   Years of education: Not on file   Highest education level: Not on file  Occupational History   Occupation: representative    Employer: [employer]  Tobacco Use   Smoking status: Never   Smokeless tobacco: Never  Vaping Use   Vaping Use: Never used  Substance and Sexual Activity   Alcohol use: Not Currently    Comment: Occasional   Drug use: No   Sexual activity: Yes    Birth control/protection: None  Other Topics Concern   Not on file  Social History Narrative   Lives at home with husband and cat  Left handed   Caffeine: frappe occasionally    Social Determinants of Health   Financial Resource Strain: Not on file  Food Insecurity: Not on file  Transportation Needs: Not on file  Physical Activity: Not on file  Stress: Not on file  Social Connections: Not on file  Intimate Partner Violence: Not on file    Past Surgical History:  Procedure Laterality Date   APPENDECTOMY  04/07/2011   CHOLECYSTECTOMY  10/18/2011   Procedure: LAPAROSCOPIC CHOLECYSTECTOMY WITH INTRAOPERATIVE CHOLANGIOGRAM;  Surgeon: Rolm Bookbinder, MD;  Location: [place];  Service: General;  Laterality: N/A;   IR ANGIO INTRA EXTRACRAN SEL COM CAROTID INNOMINATE UNI R MOD SED  06/02/2019   IR ANGIO  INTRA EXTRACRAN SEL INTERNAL CAROTID UNI L MOD SED  06/02/2019   IR ANGIO VERTEBRAL SEL VERTEBRAL UNI R MOD SED  06/02/2019   IR US GUIDE VASC ACCESS RIGHT  06/02/2019   TONSILLECTOMY     WISDOM TOOTH EXTRACTION      Family History  Problem Relation Age of Onset   Breast cancer Mother        mets to spine   Asthma Mother    Asthma Father    Diabetes type II Sister    Breast cancer Maternal Grandmother    Diabetes Maternal Grandmother    Rheum arthritis Maternal Grandmother    Hypertension Maternal Grandfather    Diabetes Paternal Grandfather    Ovarian cancer Maternal Aunt    Ovarian cancer Paternal Aunt    Colon cancer Neg Hx     Allergies  Allergen Reactions   Dairy Aid [Tilactase] Anaphylaxis   Other Hives and Anaphylaxis     shrimp, dairy products, shellfish (Swelling) Other reaction(s): Hives   Shellfish Allergy Anaphylaxis   Benadryl [Diphenhydramine Hcl] Hives   Iodine Hives and Rash   Tape Rash    Current Outpatient Medications on File Prior to Visit  Medication Sig Dispense Refill   albuterol (VENTOLIN HFA) 108 (90 Base) MCG/ACT inhaler Inhale 2 puffs into the lungs every 4 (four) hours as needed for wheezing or shortness of breath (coughing). 18 g 2   clomiPHENE (CLOMID) 50 MG tablet Take 1 tablet by mouth every day 5 tablet 0   Erenumab-aooe (AIMOVIG) 140 MG/ML SOAJ Inject 140 mg into the skin every 30 (thirty) days. 1 mL 5   fluticasone (FLONASE) 50 MCG/ACT nasal spray Place 1 spray into both nostrils 2 (two) times daily as needed for rhinitis. 16 g 5   hydrocortisone (ANUSOL-HC) 25 MG suppository Unwrap and insert 1 suppository rectally 2 times daily. 12 suppository 0   hydrOXYzine (ATARAX) 10 MG tablet Take 1 tablet by mouth 2 (two) times daily as needed for anxiety. 60 tablet 0   ibuprofen (ADVIL) 800 MG tablet Take 1 tablet  by mouth every 8 hours as needed. 30 tablet 0   Insulin Pen Needle (UNIFINE PENTIPS) 31G X 6 MM MISC Use as directed with Saxenda 100 each  1   levocetirizine (XYZAL) 5 MG tablet Take 1 tablet (5 mg total) by mouth every evening. 30 tablet 5   montelukast (SINGULAIR) 10 MG tablet Take 1 tablet (10 mg total) by mouth at bedtime. 30 tablet 5   predniSONE (STERAPRED UNI-PAK 21 TAB) 10 MG (21) TBPK tablet 6 day taper; take as directed on package instructions 21 tablet 0   Rimegepant Sulfate (NURTEC) 75 MG TBDP Dissolve 1 tablet by mouth once as needed. May repeat once after 2 hours.  No more than  2 tablets in 24 hours. 10 tablet 3   [DISCONTINUED] venlafaxine XR (EFFEXOR XR) 37.5 MG 24 hr capsule Take 2 capsules by mouth daily with breakfast for anxiety and depression. 180 capsule 0   No current facility-administered medications on file prior to visit.    BP 120/68   Pulse (!) 101   Temp 98.6 F (37 C) (Oral)   Ht 5' 5.5" (1.664 m)   Wt 292 lb (132.5 kg)   SpO2 100%   BMI 47.85 kg/m  Objective:   Physical Exam Cardiovascular:     Rate and Rhythm: Normal rate and regular rhythm.  Pulmonary:     Effort: Pulmonary effort is normal.     Breath sounds: Normal breath sounds.  Abdominal:     General: Bowel sounds are normal.     Palpations: Abdomen is soft.     Tenderness: There is no abdominal tenderness.  Musculoskeletal:     Cervical back: Neck supple.  Skin:    General: Skin is warm and dry.  Neurological:     Mental Status: She is alert.  Psychiatric:        Mood and Affect: Mood normal.           Assessment & Plan:   Problem List Items Addressed This Visit       Cardiovascular and Mediastinum   Hemorrhoids       Pleas Koch, NP

## 2021-09-01 NOTE — Patient Instructions (Signed)
Please call your insurance company to ask about Wegovy or Ozempic for weight loss. Also ask about any other medication they will cover.  Start a stool softener called docusate sodium (Colace). Take 1 capsule by mouth 1-2 times daily.  We increased the dose of your venlafaxine to 150 mg. This is for anxiety and depression.  It was a pleasure to see you today!

## 2021-09-01 NOTE — Assessment & Plan Note (Signed)
Patient experienced side effects from [place], insurance would not cover [phone].  Will have her call insurance to check on Ozempic/Wegovy again.  Encouraged her to work on diet, try to avoid recurrent snacking, start exercising.

## 2021-09-01 NOTE — Assessment & Plan Note (Signed)
Following with neurology.  Start Amovig 140 mg monthly. Continue Ibuprofen 800 mg PRN for severe migraines,

## 2021-09-01 NOTE — Assessment & Plan Note (Signed)
Repeat A1C pending. 

## 2021-09-01 NOTE — Assessment & Plan Note (Signed)
Improved but not at goal. Increase venlafaxine ER to 150 mg. New Rx sent to pharmacy.  She will update.

## 2021-09-04 ENCOUNTER — Other Ambulatory Visit (HOSPITAL_COMMUNITY): Payer: Self-pay

## 2021-09-09 ENCOUNTER — Other Ambulatory Visit (HOSPITAL_COMMUNITY): Payer: Self-pay

## 2021-09-15 DIAGNOSIS — E282 Polycystic ovarian syndrome: Secondary | ICD-10-CM

## 2021-09-15 DIAGNOSIS — G4733 Obstructive sleep apnea (adult) (pediatric): Secondary | ICD-10-CM

## 2021-09-15 DIAGNOSIS — R7303 Prediabetes: Secondary | ICD-10-CM

## 2021-09-15 DIAGNOSIS — Z6841 Body Mass Index (BMI) 40.0 and over, adult: Secondary | ICD-10-CM

## 2021-09-17 MED ORDER — WEGOVY 0.25 MG/0.5ML ~~LOC~~ SOAJ
SUBCUTANEOUS | 0 refills | Status: DC
  Filled 2021-09-17: qty 2, 28d supply, fill #0

## 2021-09-18 ENCOUNTER — Other Ambulatory Visit (HOSPITAL_COMMUNITY): Payer: Self-pay

## 2021-09-19 ENCOUNTER — Other Ambulatory Visit (HOSPITAL_COMMUNITY): Payer: Self-pay

## 2021-09-20 ENCOUNTER — Other Ambulatory Visit (HOSPITAL_COMMUNITY): Payer: Self-pay

## 2021-09-25 ENCOUNTER — Other Ambulatory Visit (HOSPITAL_COMMUNITY): Payer: Self-pay

## 2021-09-25 MED ORDER — UNIFINE PENTIPS 31G X 6 MM MISC
0 refills | Status: DC
  Filled 2021-09-25: qty 100, 90d supply, fill #0

## 2021-09-25 MED ORDER — SAXENDA 18 MG/3ML ~~LOC~~ SOPN
PEN_INJECTOR | SUBCUTANEOUS | 0 refills | Status: DC
  Filled 2021-09-25 – 2021-11-03 (×2): qty 15, 30d supply, fill #0

## 2021-09-25 NOTE — Addendum Note (Signed)
Addended by: Doreene Nest on: 09/25/2021 05:25 PM   Modules accepted: Orders

## 2021-09-25 NOTE — Addendum Note (Signed)
Addended by: Doreene Nest on: 09/25/2021 01:35 PM   Modules accepted: Orders

## 2021-09-30 ENCOUNTER — Other Ambulatory Visit (HOSPITAL_COMMUNITY): Payer: Self-pay

## 2021-10-02 ENCOUNTER — Other Ambulatory Visit (HOSPITAL_COMMUNITY): Payer: Self-pay

## 2021-10-03 ENCOUNTER — Other Ambulatory Visit (HOSPITAL_COMMUNITY): Payer: Self-pay

## 2021-10-18 ENCOUNTER — Other Ambulatory Visit (HOSPITAL_COMMUNITY): Payer: Self-pay

## 2021-10-19 ENCOUNTER — Other Ambulatory Visit (HOSPITAL_COMMUNITY): Payer: Self-pay

## 2021-10-20 ENCOUNTER — Other Ambulatory Visit (HOSPITAL_COMMUNITY): Payer: Self-pay

## 2021-10-25 ENCOUNTER — Other Ambulatory Visit (HOSPITAL_COMMUNITY): Payer: Self-pay

## 2021-11-01 ENCOUNTER — Encounter (INDEPENDENT_AMBULATORY_CARE_PROVIDER_SITE_OTHER): Payer: Self-pay

## 2021-11-03 ENCOUNTER — Other Ambulatory Visit (HOSPITAL_COMMUNITY): Payer: Self-pay

## 2021-11-04 ENCOUNTER — Other Ambulatory Visit (HOSPITAL_COMMUNITY): Payer: Self-pay

## 2021-11-08 ENCOUNTER — Other Ambulatory Visit (HOSPITAL_COMMUNITY): Payer: Self-pay

## 2021-11-10 ENCOUNTER — Other Ambulatory Visit (HOSPITAL_COMMUNITY): Payer: Self-pay

## 2021-11-15 ENCOUNTER — Other Ambulatory Visit (HOSPITAL_COMMUNITY): Payer: Self-pay

## 2021-11-18 ENCOUNTER — Other Ambulatory Visit (HOSPITAL_COMMUNITY): Payer: Self-pay

## 2021-12-06 ENCOUNTER — Other Ambulatory Visit (HOSPITAL_COMMUNITY): Payer: Self-pay

## 2021-12-07 ENCOUNTER — Other Ambulatory Visit (HOSPITAL_COMMUNITY): Payer: Self-pay

## 2021-12-15 ENCOUNTER — Other Ambulatory Visit (INDEPENDENT_AMBULATORY_CARE_PROVIDER_SITE_OTHER): Payer: Self-pay | Admitting: Primary Care

## 2021-12-15 ENCOUNTER — Other Ambulatory Visit (HOSPITAL_COMMUNITY): Payer: Self-pay

## 2021-12-16 ENCOUNTER — Other Ambulatory Visit (HOSPITAL_COMMUNITY): Payer: Self-pay

## 2021-12-18 ENCOUNTER — Other Ambulatory Visit (HOSPITAL_COMMUNITY): Payer: Self-pay

## 2021-12-18 ENCOUNTER — Telehealth: Payer: Self-pay | Admitting: *Deleted

## 2021-12-18 NOTE — Telephone Encounter (Signed)
Felicia Salinas (Key: [id number])   PA Aimovig complete waiting on approval

## 2021-12-23 ENCOUNTER — Encounter (HOSPITAL_COMMUNITY): Payer: Self-pay | Admitting: Pharmacist

## 2021-12-23 ENCOUNTER — Other Ambulatory Visit (HOSPITAL_COMMUNITY): Payer: Self-pay

## 2021-12-26 NOTE — Telephone Encounter (Signed)
PA for aimovig denied. (For coverage the following policy requirements need to be met::  you have had an overall improvement in function with therapy.  You do not have any toxicities (intolderable injection site pain or constipation).

## 2021-12-27 NOTE — Telephone Encounter (Signed)
(  Key: [id number]) CMM for Aimovig. (Continuation).

## 2021-12-28 NOTE — Telephone Encounter (Signed)
Received fax that no coverage on this pt after doing the aimovig 2nd PA.  I called and spoke to [facility], at [facility] and he reiterated this.  I told him if they could retroactively work off the 12-21-2021 (previous PA if answered the questions.  ) he would relay in the note.  I called and LMVM for pt that this is what I found out.  I needed to verify if new insurance. I was not sure if she wanted the appt scheduled for Monday at 1530 with AL/NP.  I cancelled the hold on it.  Please call next week.

## 2021-12-29 ENCOUNTER — Telehealth: Payer: Self-pay

## 2021-12-29 ENCOUNTER — Other Ambulatory Visit (HOSPITAL_COMMUNITY): Payer: Self-pay

## 2021-12-29 ENCOUNTER — Ambulatory Visit: Payer: Self-pay | Admitting: Primary Care

## 2021-12-29 NOTE — Telephone Encounter (Signed)
Prior auth started for Saxenda 18MG /3ML pen-injectors. Felicia Salinas (Key: [id number]) Rx #: [id number] Waiting for determination.

## 2021-12-29 NOTE — Telephone Encounter (Signed)
LVM for patient to call back in and reschedule.

## 2021-12-29 NOTE — Telephone Encounter (Signed)
[place] Night - Client Nonclinical Telephone Record  [facility] Client [facility] Night - Client Client Site [place] - Night Contact Type Call Who Is Calling Patient / Member / Family / Caregiver Caller Name [place], [place] Phone Number [phone] Patient Name Felicia Salinas, Felicia Salinas Patient DOB 1990/09/08 Call Type Message Only Information Provided Reason for Call Request to Reschedule Office Appointment Initial Comment Caller states 7.20am appt and needs to reschedule Patient request to speak to RN No Disp. Time Disposition Final User 12/29/2021 7:16:28 AM General Information Provided Yes Mable Paris Call Closed By: Mable Paris Transaction Date/Time: 12/29/2021 7:15:22 AM (ET

## 2021-12-30 ENCOUNTER — Other Ambulatory Visit (HOSPITAL_COMMUNITY): Payer: Self-pay

## 2022-01-01 NOTE — Telephone Encounter (Signed)
Prior auth for Saxenda 18MG /3ML pen-injectors has been denied. Felicia Salinas ([name]) Rx #: [id number] The requested medication is not covered by the plan. Medications used for weight loss are not covered under your health plan's pharmacy coverage.  Denial letter sent to scanning.

## 2022-01-04 ENCOUNTER — Other Ambulatory Visit (HOSPITAL_COMMUNITY): Payer: Self-pay

## 2022-01-09 ENCOUNTER — Other Ambulatory Visit (HOSPITAL_COMMUNITY): Payer: Self-pay

## 2022-01-09 ENCOUNTER — Ambulatory Visit: Payer: BC Managed Care – PPO | Admitting: Primary Care

## 2022-01-09 ENCOUNTER — Encounter: Payer: Self-pay | Admitting: Primary Care

## 2022-01-09 DIAGNOSIS — R7303 Prediabetes: Secondary | ICD-10-CM

## 2022-01-09 DIAGNOSIS — Z6841 Body Mass Index (BMI) 40.0 and over, adult: Secondary | ICD-10-CM

## 2022-01-09 MED ORDER — OZEMPIC (0.25 OR 0.5 MG/DOSE) 2 MG/3ML ~~LOC~~ SOPN
PEN_INJECTOR | SUBCUTANEOUS | 0 refills | Status: DC
  Filled 2022-01-09: qty 3, 28d supply, fill #0

## 2022-01-09 NOTE — Progress Notes (Signed)
Subjective:    Patient ID: Felicia Salinas, female    DOB: 10/16/1990, 31 y.o.   MRN: [id number]  HPI  Felicia Salinas is a very pleasant 31 y.o. female with a history of morbid obesity, OSA, asthma, PCOS, migraines who presents today to discuss obesity.  She would like to start weight loss medication. She's been prescribed Ozempic, Wegovy, and Saxenda but she's been unable to start as her insurance historically would not cover. She was able to start Saxenda last year but this caused a significant curb in her appetite.   Over the last few months she's noticed an overall decline in her appetite. Is eating smaller portions.   Diet currently consists of:  Breakfast: Peanut butter crackers, fast food  Lunch: Skips mostly  Dinner: Protein, starch, veggies  Snacks: Trail mix, peanut butter crackers Desserts: Chocolate covered nuts, candy. Several times weekly  Beverages: Water, juice, no calorie flavored packets  Exercise: Home work outs through [employer], just started back and has done one exercise. Plans on resuming again    Wt Readings from Last 3 Encounters:  01/09/22 289 lb (131.1 kg)  09/01/21 292 lb (132.5 kg)  05/12/21 287 lb (130.2 kg)        Review of Systems  Constitutional:  Positive for appetite change.  Respiratory:  Negative for shortness of breath.   Cardiovascular:  Negative for chest pain.  Gastrointestinal:  Negative for abdominal pain.  Neurological:  Negative for dizziness.         Past Medical History:  Diagnosis Date   Angio-edema    Anxiety    Asthma    controlled per patient last attack in January   Environmental allergies    Obesity    Urticaria     Social History   Socioeconomic History   Marital status: Married    Spouse name: Not on file   Number of children: 0   Years of education: Not on file   Highest education level: Not on file  Occupational History   Occupation: representative    Employer: [employer]  Tobacco Use   Smoking status:  Never   Smokeless tobacco: Never  Vaping Use   Vaping Use: Never used  Substance and Sexual Activity   Alcohol use: Not Currently    Comment: Occasional   Drug use: No   Sexual activity: Yes    Birth control/protection: None  Other Topics Concern   Not on file  Social History Narrative   Lives at home with husband and cat   Left handed   Caffeine: frappe occasionally    Social Determinants of Health   Financial Resource Strain: Not on file  Food Insecurity: Not on file  Transportation Needs: Not on file  Physical Activity: Not on file  Stress: Not on file  Social Connections: Not on file  Intimate Partner Violence: Not on file    Past Surgical History:  Procedure Laterality Date   APPENDECTOMY  04/07/2011   CHOLECYSTECTOMY  10/18/2011   Procedure: LAPAROSCOPIC CHOLECYSTECTOMY WITH INTRAOPERATIVE CHOLANGIOGRAM;  Surgeon: Emelia Loron, MD;  Location: [facility] OR;  Service: General;  Laterality: N/A;   IR ANGIO INTRA EXTRACRAN SEL COM CAROTID INNOMINATE UNI R MOD SED  06/02/2019   IR ANGIO INTRA EXTRACRAN SEL INTERNAL CAROTID UNI L MOD SED  06/02/2019   IR ANGIO VERTEBRAL SEL VERTEBRAL UNI R MOD SED  06/02/2019   IR US GUIDE VASC ACCESS RIGHT  06/02/2019   TONSILLECTOMY     WISDOM TOOTH EXTRACTION  Family History  Problem Relation Age of Onset   Breast cancer Mother        mets to spine   Asthma Mother    Asthma Father    Diabetes type II Sister    Breast cancer Maternal Grandmother    Diabetes Maternal Grandmother    Rheum arthritis Maternal Grandmother    Hypertension Maternal Grandfather    Diabetes Paternal Grandfather    Ovarian cancer Maternal Aunt    Ovarian cancer Paternal Aunt    Colon cancer Neg Hx     Allergies  Allergen Reactions   Dairy Aid [Tilactase] Anaphylaxis   Other Hives and Anaphylaxis     shrimp, dairy products, shellfish (Swelling) Other reaction(s): Hives   Shellfish Allergy Anaphylaxis   Benadryl [Diphenhydramine Hcl] Hives   Iodine  Hives and Rash   Tape Rash    Current Outpatient Medications on File Prior to Visit  Medication Sig Dispense Refill   albuterol (VENTOLIN HFA) 108 (90 Base) MCG/ACT inhaler Inhale 2 puffs into the lungs every 4 hours as needed for wheezing or shortness of breath (coughing). 18 g 2   Erenumab-aooe (AIMOVIG) 140 MG/ML SOAJ Inject 140 mg into the skin every 30 (thirty) days. 1 mL 5   fluticasone (FLONASE) 50 MCG/ACT nasal spray Place 1 spray into both nostrils 2 (two) times daily as needed for rhinitis. 16 g 5   hydrocortisone (ANUSOL-HC) 25 MG suppository Unwrap and insert 1 suppository rectally 2 times daily. 12 suppository 0   Insulin Pen Needle (UNIFINE PENTIPS) 31G X 6 MM MISC Use as directed with Saxenda 100 each 0   montelukast (SINGULAIR) 10 MG tablet Take 1 tablet (10 mg total) by mouth at bedtime. 30 tablet 5   Rimegepant Sulfate (NURTEC) 75 MG TBDP Dissolve 1 tablet by mouth once as needed. May repeat once after 2 hours.  No more than 2 tablets in 24 hours. 10 tablet 3   venlafaxine XR (EFFEXOR-XR) 150 MG 24 hr capsule Take 1 capsule (150 mg total) by mouth daily with breakfast for anxiety and depression. 90 capsule 0   clomiPHENE (CLOMID) 50 MG tablet Take 1 tablet by mouth every day (Patient not taking: Reported on 01/09/2022) 5 tablet 0   hydrOXYzine (ATARAX) 10 MG tablet Take 1 tablet by mouth 2 (two) times daily as needed for anxiety. (Patient not taking: Reported on 01/09/2022) 60 tablet 0   ibuprofen (ADVIL) 800 MG tablet Take 1 tablet  by mouth every 8 hours as needed. (Patient not taking: Reported on 01/09/2022) 30 tablet 0   levocetirizine (XYZAL) 5 MG tablet Take 1 tablet (5 mg total) by mouth every evening. (Patient not taking: Reported on 01/09/2022) 30 tablet 5   predniSONE (STERAPRED UNI-PAK 21 TAB) 10 MG (21) TBPK tablet 6 day taper; take as directed on package instructions (Patient not taking: Reported on 01/09/2022) 21 tablet 0   No current facility-administered  medications on file prior to visit.    BP (!) 146/88   Temp (!) 97.2 F (36.2 C) (Temporal)   Ht 5' 5.5" (1.664 m)   Wt 289 lb (131.1 kg)   BMI 47.36 kg/m  Objective:   Physical Exam Cardiovascular:     Rate and Rhythm: Normal rate and regular rhythm.  Pulmonary:     Effort: Pulmonary effort is normal.     Breath sounds: Normal breath sounds.  Musculoskeletal:     Cervical back: Neck supple.  Skin:    General: Skin is warm and dry.  Assessment & Plan:   Problem List Items Addressed This Visit       Other   Morbid obesity with BMI of 45.0-49.9, adult (HCC) - Primary    Agree to proceed with treatment for morbid obesity. She has new insurance that will cover.  Start Ozempic 0.25 mg weekly x 4 weeks, then increase to 0.5 mg weekly thereafter. We discussed her overall poor diet, to increase veggies, protein and to eliminate fast food/junk food. Increase exercise.  Repeat labs at next visit.  We will plan to see her back in 2 months for follow up.       Relevant Medications   Semaglutide,0.25 or 0.5MG /DOS, (OZEMPIC, 0.25 OR 0.5 MG/DOSE,) 2 MG/3ML SOPN   Prediabetes    Discussed the importance of a healthy diet and regular exercise in order for weight loss, and to reduce the risk of further co-morbidity.   Repeat A1C next visit.      Relevant Medications   Semaglutide,0.25 or 0.5MG /DOS, (OZEMPIC, 0.25 OR 0.5 MG/DOSE,) 2 MG/3ML SOPN       Doreene Nest, NP

## 2022-01-09 NOTE — Assessment & Plan Note (Addendum)
Agree to proceed with treatment for morbid obesity. She has new insurance that will cover.  Start Ozempic 0.25 mg weekly x 4 weeks, then increase to 0.5 mg weekly thereafter. We discussed her overall poor diet, to increase veggies, protein and to eliminate fast food/junk food. Increase exercise.  Repeat labs at next visit.  We will plan to see her back in 2 months for follow up.

## 2022-01-09 NOTE — Assessment & Plan Note (Signed)
Discussed the importance of a healthy diet and regular exercise in order for weight loss, and to reduce the risk of further co-morbidity.   Repeat A1C next visit.

## 2022-01-10 ENCOUNTER — Telehealth: Payer: Self-pay

## 2022-01-10 NOTE — Telephone Encounter (Signed)
Please notify patient.  Have her check with her insurance to see if they will cover Felicia Salinas or Saxenda.

## 2022-01-10 NOTE — Telephone Encounter (Signed)
Prior auth started for Ozempic (0.25 or 0.5 MG/DOSE) 2MG /3ML pen-injectors. Felicia Salinas (Felicia Salinas) Rx #: [id number] Waiting for determination.

## 2022-01-10 NOTE — Telephone Encounter (Signed)
Unable to reach patient. Left voicemail to return call to our office.   

## 2022-01-10 NOTE — Telephone Encounter (Signed)
Prior auth for Ozempic (0.25 or 0.5 MG/DOSE) 2MG /3ML pen-injectors has been denied. Nya Monds (KeyBradd Burner) Rx #: [id number] The denial was based on our criteria for Ozempic Inj 2mg /80ml.  The requested drug is not covered by the plan.   Denial letter sent to scanning.

## 2022-01-11 NOTE — Telephone Encounter (Signed)
Spoke with patient she will check with insurance and see if they will cover [facility] or Saxenda.

## 2022-01-17 ENCOUNTER — Other Ambulatory Visit (HOSPITAL_COMMUNITY): Payer: Self-pay

## 2022-01-19 ENCOUNTER — Other Ambulatory Visit (HOSPITAL_COMMUNITY): Payer: Self-pay

## 2022-01-20 ENCOUNTER — Other Ambulatory Visit (HOSPITAL_COMMUNITY): Payer: Self-pay

## 2022-01-27 ENCOUNTER — Other Ambulatory Visit (HOSPITAL_COMMUNITY): Payer: Self-pay

## 2022-02-02 ENCOUNTER — Other Ambulatory Visit (HOSPITAL_COMMUNITY): Payer: Self-pay

## 2022-02-07 ENCOUNTER — Other Ambulatory Visit (HOSPITAL_COMMUNITY): Payer: Self-pay

## 2022-02-07 ENCOUNTER — Encounter: Payer: BC Managed Care – PPO | Admitting: Primary Care

## 2022-02-14 ENCOUNTER — Encounter: Payer: Self-pay | Admitting: Primary Care

## 2022-02-14 ENCOUNTER — Ambulatory Visit (INDEPENDENT_AMBULATORY_CARE_PROVIDER_SITE_OTHER): Payer: BC Managed Care – PPO | Admitting: Primary Care

## 2022-02-14 VITALS — BP 124/86 | HR 91 | Temp 97.5°F | Ht 65.5 in | Wt 279.0 lb

## 2022-02-14 DIAGNOSIS — F32A Depression, unspecified: Secondary | ICD-10-CM

## 2022-02-14 DIAGNOSIS — G43711 Chronic migraine without aura, intractable, with status migrainosus: Secondary | ICD-10-CM

## 2022-02-14 DIAGNOSIS — N979 Female infertility, unspecified: Secondary | ICD-10-CM

## 2022-02-14 DIAGNOSIS — G4733 Obstructive sleep apnea (adult) (pediatric): Secondary | ICD-10-CM | POA: Diagnosis not present

## 2022-02-14 DIAGNOSIS — J4520 Mild intermittent asthma, uncomplicated: Secondary | ICD-10-CM

## 2022-02-14 DIAGNOSIS — Z23 Encounter for immunization: Secondary | ICD-10-CM

## 2022-02-14 DIAGNOSIS — Z6841 Body Mass Index (BMI) 40.0 and over, adult: Secondary | ICD-10-CM

## 2022-02-14 DIAGNOSIS — D649 Anemia, unspecified: Secondary | ICD-10-CM

## 2022-02-14 DIAGNOSIS — Z0000 Encounter for general adult medical examination without abnormal findings: Secondary | ICD-10-CM

## 2022-02-14 DIAGNOSIS — F419 Anxiety disorder, unspecified: Secondary | ICD-10-CM

## 2022-02-14 DIAGNOSIS — R7303 Prediabetes: Secondary | ICD-10-CM

## 2022-02-14 DIAGNOSIS — K649 Unspecified hemorrhoids: Secondary | ICD-10-CM

## 2022-02-14 DIAGNOSIS — E282 Polycystic ovarian syndrome: Secondary | ICD-10-CM

## 2022-02-14 LAB — IBC + FERRITIN
Ferritin: 9.2 ng/mL — ABNORMAL LOW (ref 10.0–291.0)
Iron: 42 ug/dL (ref 42–145)
Saturation Ratios: 10 % — ABNORMAL LOW (ref 20.0–50.0)
TIBC: 421.4 ug/dL (ref 250.0–450.0)
Transferrin: 301 mg/dL (ref 212.0–360.0)

## 2022-02-14 LAB — COMPREHENSIVE METABOLIC PANEL
ALT: 21 U/L (ref 0–35)
AST: 17 U/L (ref 0–37)
Albumin: 4.2 g/dL (ref 3.5–5.2)
Alkaline Phosphatase: 51 U/L (ref 39–117)
BUN: 9 mg/dL (ref 6–23)
CO2: 27 mEq/L (ref 19–32)
Calcium: 9.5 mg/dL (ref 8.4–10.5)
Chloride: 104 mEq/L (ref 96–112)
Creatinine, Ser: 0.75 mg/dL (ref 0.40–1.20)
GFR: 104.46 mL/min (ref >60.00)
Glucose, Bld: 88 mg/dL (ref 70–99)
Potassium: 4.3 mEq/L (ref 3.5–5.1)
Sodium: 138 mEq/L (ref 135–145)
Total Bilirubin: 0.3 mg/dL (ref 0.2–1.2)
Total Protein: 7.1 g/dL (ref 6.0–8.3)

## 2022-02-14 LAB — HEMOGLOBIN A1C: Hgb A1c MFr Bld: 5.6 % (ref 4.6–6.5)

## 2022-02-14 LAB — LIPID PANEL
Cholesterol: 219 mg/dL — ABNORMAL HIGH (ref 0–200)
HDL: 64.6 mg/dL (ref >39.00)
LDL Cholesterol: 142 mg/dL — ABNORMAL HIGH (ref 0–99)
NonHDL: 154.03
Total CHOL/HDL Ratio: 3
Triglycerides: 59 mg/dL (ref 0.0–149.0)
VLDL: 11.8 mg/dL (ref 0.0–40.0)

## 2022-02-14 LAB — CBC
HCT: 35.1 % — ABNORMAL LOW (ref 36.0–46.0)
Hemoglobin: 11.6 g/dL — ABNORMAL LOW (ref 12.0–15.0)
MCHC: 33.1 g/dL (ref 30.0–36.0)
MCV: 88 fl (ref 78.0–100.0)
Platelets: 363 10*3/uL (ref 150.0–400.0)
RBC: 3.99 Mil/uL (ref 3.87–5.11)
RDW: 15.2 % (ref 11.5–15.5)
WBC: 5.9 10*3/uL (ref 4.0–10.5)

## 2022-02-14 NOTE — Assessment & Plan Note (Signed)
Intolerant to CPAP machine, follows with neurology. Recommended she get back with neurology to discuss.

## 2022-02-14 NOTE — Assessment & Plan Note (Signed)
Immunizations UTD. Influenza vaccine provided today. Pap smear UTD, follows with GYN  Discussed the importance of a healthy diet and regular exercise in order for weight loss, and to reduce the risk of further co-morbidity.  Exam stable. Labs pending.  Follow up in 1 year for repeat physical.  

## 2022-02-14 NOTE — Assessment & Plan Note (Signed)
Controlled.  Continue albuterol PRN for which she uses sparingly.

## 2022-02-14 NOTE — Assessment & Plan Note (Signed)
Improved.  Continue Anusol-HC suppositories PRN.

## 2022-02-14 NOTE — Assessment & Plan Note (Signed)
Repeat A1C pending. Consider metformin.

## 2022-02-14 NOTE — Assessment & Plan Note (Signed)
Controlled.  Continue venlafaxine ER 150 mg daily, hydroxyzine 10 mg PRN

## 2022-02-14 NOTE — Patient Instructions (Signed)
Stop by the lab prior to leaving today. I will notify you of your results once received.   It was a pleasure to see you today!  Preventive Care 21-33 Years Old, Female Preventive care refers to lifestyle choices and visits with your health care provider that can promote health and wellness. Preventive care visits are also called wellness exams. What can I expect for my preventive care visit? Counseling During your preventive care visit, your health care provider may ask about your: Medical history, including: Past medical problems. Family medical history. Pregnancy history. Current health, including: Menstrual cycle. Method of birth control. Emotional well-being. Home life and relationship well-being. Sexual activity and sexual health. Lifestyle, including: Alcohol, nicotine or tobacco, and drug use. Access to firearms. Diet, exercise, and sleep habits. Work and work environment. Sunscreen use. Safety issues such as seatbelt and bike helmet use. Physical exam Your health care provider may check your: Height and weight. These may be used to calculate your BMI (body mass index). BMI is a measurement that tells if you are at a healthy weight. Waist circumference. This measures the distance around your waistline. This measurement also tells if you are at a healthy weight and may help predict your risk of certain diseases, such as type 2 diabetes and high blood pressure. Heart rate and blood pressure. Body temperature. Skin for abnormal spots. What immunizations do I need?  Vaccines are usually given at various ages, according to a schedule. Your health care provider will recommend vaccines for you based on your age, medical history, and lifestyle or other factors, such as travel or where you work. What tests do I need? Screening Your health care provider may recommend screening tests for certain conditions. This may include: Pelvic exam and Pap test. Lipid and cholesterol  levels. Diabetes screening. This is done by checking your blood sugar (glucose) after you have not eaten for a while (fasting). Hepatitis B test. Hepatitis C test. HIV (human immunodeficiency virus) test. STI (sexually transmitted infection) testing, if you are at risk. BRCA-related cancer screening. This may be done if you have a family history of breast, ovarian, tubal, or peritoneal cancers. Talk with your health care provider about your test results, treatment options, and if necessary, the need for more tests. Follow these instructions at home: Eating and drinking  Eat a healthy diet that includes fresh fruits and vegetables, whole grains, lean protein, and low-fat dairy products. Take vitamin and mineral supplements as recommended by your health care provider. Do not drink alcohol if: Your health care provider tells you not to drink. You are pregnant, may be pregnant, or are planning to become pregnant. If you drink alcohol: Limit how much you have to 0-1 drink a day. Know how much alcohol is in your drink. In the [place] one drink equals one 12 oz bottle of beer (355 mL), one 5 oz glass of wine (148 mL), or one 1 oz glass of hard liquor (44 mL). Lifestyle Brush your teeth every morning and night with fluoride toothpaste. Floss one time each day. Exercise for at least 30 minutes 5 or more days each week. Do not use any products that contain nicotine or tobacco. These products include cigarettes, chewing tobacco, and vaping devices, such as e-cigarettes. If you need help quitting, ask your health care provider. Do not use drugs. If you are sexually active, practice safe sex. Use a condom or other form of protection to prevent STIs. If you do not wish to become pregnant, use a   form of birth control. If you plan to become pregnant, see your health care provider for a prepregnancy visit. Find healthy ways to manage stress, such as: Meditation, yoga, or listening to  music. Journaling. Talking to a trusted person. Spending time with friends and family. Minimize exposure to UV radiation to reduce your risk of skin cancer. Safety Always wear your seat belt while driving or riding in a vehicle. Do not drive: If you have been drinking alcohol. Do not ride with someone who has been drinking. If you have been using any mind-altering substances or drugs. While texting. When you are tired or distracted. Wear a helmet and other protective equipment during sports activities. If you have firearms in your house, make sure you follow all gun safety procedures. Seek help if you have been physically or sexually abused. What's next? Go to your health care provider once a year for an annual wellness visit. Ask your health care provider how often you should have your eyes and teeth checked. Stay up to date on all vaccines. This information is not intended to replace advice given to you by your health care provider. Make sure you discuss any questions you have with your health care provider. Document Revised: 09/07/2020 Document Reviewed: 09/07/2020 Elsevier Patient Education  [place].

## 2022-02-14 NOTE — Assessment & Plan Note (Signed)
Repeat A1C pending. Consider metformin.   Await results.

## 2022-02-14 NOTE — Progress Notes (Signed)
Subjective:    Patient ID: Felicia Salinas, female    DOB: Apr 30, 1990, 31 y.o.   MRN: [id number]  HPI  Felicia Salinas is a very pleasant 31 y.o. female who presents today for complete physical and follow up of chronic conditions.  Immunizations: -Tetanus: 2023 -Influenza: Completed today   Diet: Improved diet. She has reduced snacking and candy. She is drinking mostly water.  Exercise: Home works outs 2-3 days weekly.   Eye exam: Completes annually  Dental exam: Completed 1 year ago.  Pap Smear: Completes per GYN.   BP Readings from Last 3 Encounters:  02/14/22 124/86  01/09/22 (!) 146/88  09/01/21 120/68     Review of Systems  Constitutional:  Negative for unexpected weight change.  HENT:  Negative for rhinorrhea.   Eyes:  Negative for visual disturbance.  Respiratory:  Negative for cough and shortness of breath.   Cardiovascular:  Negative for chest pain.  Gastrointestinal:  Negative for constipation and diarrhea.       Hemorrhoids   Genitourinary:  Negative for difficulty urinating.  Musculoskeletal:  Negative for arthralgias and myalgias.  Skin:  Negative for rash.  Allergic/Immunologic: Negative for environmental allergies.  Neurological:  Positive for headaches. Negative for dizziness.  Psychiatric/Behavioral:  The patient is not nervous/anxious.          Past Medical History:  Diagnosis Date   Angio-edema    Anxiety    Asthma    controlled per patient last attack in January   Environmental allergies    Obesity    Urticaria     Social History   Socioeconomic History   Marital status: Married    Spouse name: Not on file   Number of children: 0   Years of education: Not on file   Highest education level: Not on file  Occupational History   Occupation: representative    Employer: [employer]  Tobacco Use   Smoking status: Never   Smokeless tobacco: Never  Vaping Use   Vaping Use: Never used  Substance and Sexual Activity   Alcohol use: Not Currently     Comment: Occasional   Drug use: No   Sexual activity: Yes    Birth control/protection: None  Other Topics Concern   Not on file  Social History Narrative   Lives at home with husband and cat   Left handed   Caffeine: frappe occasionally    Social Determinants of Health   Financial Resource Strain: Not on file  Food Insecurity: Not on file  Transportation Needs: Not on file  Physical Activity: Not on file  Stress: Not on file  Social Connections: Not on file  Intimate Partner Violence: Not on file    Past Surgical History:  Procedure Laterality Date   APPENDECTOMY  04/07/2011   CHOLECYSTECTOMY  10/18/2011   Procedure: LAPAROSCOPIC CHOLECYSTECTOMY WITH INTRAOPERATIVE CHOLANGIOGRAM;  Surgeon: Emelia Loron, MD;  Location: [facility] OR;  Service: General;  Laterality: N/A;   IR ANGIO INTRA EXTRACRAN SEL COM CAROTID INNOMINATE UNI R MOD SED  06/02/2019   IR ANGIO INTRA EXTRACRAN SEL INTERNAL CAROTID UNI L MOD SED  06/02/2019   IR ANGIO VERTEBRAL SEL VERTEBRAL UNI R MOD SED  06/02/2019   IR US GUIDE VASC ACCESS RIGHT  06/02/2019   TONSILLECTOMY     WISDOM TOOTH EXTRACTION      Family History  Problem Relation Age of Onset   Breast cancer Mother        mets to spine   Asthma  Mother    Asthma Father    Diabetes type II Sister    Breast cancer Maternal Grandmother    Diabetes Maternal Grandmother    Rheum arthritis Maternal Grandmother    Hypertension Maternal Grandfather    Diabetes Paternal Grandfather    Ovarian cancer Maternal Aunt    Ovarian cancer Paternal Aunt    Colon cancer Neg Hx     Allergies  Allergen Reactions   Dairy Aid [Tilactase] Anaphylaxis   Other Hives and Anaphylaxis     shrimp, dairy products, shellfish (Swelling) Other reaction(s): Hives   Shellfish Allergy Anaphylaxis   Benadryl [Diphenhydramine Hcl] Hives   Iodine Hives and Rash   Tape Rash    Current Outpatient Medications on File Prior to Visit  Medication Sig Dispense Refill   albuterol  (VENTOLIN HFA) 108 (90 Base) MCG/ACT inhaler Inhale 2 puffs into the lungs every 4 hours as needed for wheezing or shortness of breath (coughing). 18 g 2   Erenumab-aooe (AIMOVIG) 140 MG/ML SOAJ Inject 140 mg into the skin every 30 (thirty) days. 1 mL 5   hydrocortisone (ANUSOL-HC) 25 MG suppository Unwrap and insert 1 suppository rectally 2 times daily. 12 suppository 0   Insulin Pen Needle (UNIFINE PENTIPS) 31G X 6 MM MISC Use as directed with Saxenda 100 each 0   venlafaxine XR (EFFEXOR-XR) 150 MG 24 hr capsule Take 1 capsule (150 mg total) by mouth daily with breakfast for anxiety and depression. 90 capsule 0   fluticasone (FLONASE) 50 MCG/ACT nasal spray Place 1 spray into both nostrils 2 (two) times daily as needed for rhinitis. (Patient not taking: Reported on 02/14/2022) 16 g 5   hydrOXYzine (ATARAX) 10 MG tablet Take 1 tablet by mouth 2 (two) times daily as needed for anxiety. (Patient not taking: Reported on 02/14/2022) 60 tablet 0   levocetirizine (XYZAL) 5 MG tablet Take 1 tablet (5 mg total) by mouth every evening. (Patient not taking: Reported on 01/09/2022) 30 tablet 5   No current facility-administered medications on file prior to visit.    BP 124/86   Pulse 91   Temp (!) 97.5 F (36.4 C) (Temporal)   Ht 5' 5.5" (1.664 m)   Wt 279 lb (126.6 kg)   SpO2 93%   BMI 45.72 kg/m  Objective:   Physical Exam HENT:     Right Ear: Tympanic membrane and ear canal normal.     Left Ear: Tympanic membrane and ear canal normal.     Nose: Nose normal.  Eyes:     Conjunctiva/sclera: Conjunctivae normal.     Pupils: Pupils are equal, round, and reactive to light.  Neck:     Thyroid: No thyromegaly.  Cardiovascular:     Rate and Rhythm: Normal rate and regular rhythm.     Heart sounds: No murmur heard. Pulmonary:     Effort: Pulmonary effort is normal.     Breath sounds: Normal breath sounds. No rales.  Abdominal:     General: Bowel sounds are normal.     Palpations: Abdomen is  soft.     Tenderness: There is no abdominal tenderness.  Musculoskeletal:        General: Normal range of motion.     Cervical back: Neck supple.  Lymphadenopathy:     Cervical: No cervical adenopathy.  Skin:    General: Skin is warm and dry.     Findings: No rash.  Neurological:     Mental Status: She is alert and oriented to person,  place, and time.     Cranial Nerves: No cranial nerve deficit.     Deep Tendon Reflexes: Reflexes are normal and symmetric.  Psychiatric:        Mood and Affect: Mood normal.           Assessment & Plan:   Problem List Items Addressed This Visit       Cardiovascular and Mediastinum   Chronic migraine without aura, with intractable migraine, so stated, with status migrainosus    Uncontrolled. Follows with Neurology.        Hemorrhoids    Improved.  Continue Anusol-HC suppositories PRN.        Respiratory   Asthma    Controlled.  Continue albuterol PRN for which she uses sparingly.      OSA (obstructive sleep apnea)    Intolerant to CPAP machine, follows with neurology. Recommended she get back with neurology to discuss.         Endocrine   Polycystic ovary syndrome    Repeat A1C pending. Consider metformin.   Await results.         Genitourinary   Infertility, female    Continued.  Trying to become pregnant.  Repeat A1C pending. Consider metformin         Other   Morbid obesity with BMI of 45.0-49.9, adult [facility])    Insurance will not cover injectable weight loss meds. She will contact insurance to find out what they will cover.  Will likely start with Metformin.       Anxiety and depression    Controlled.  Continue venlafaxine ER 150 mg daily, hydroxyzine 10 mg PRN      Prediabetes    Repeat A1C pending. Consider metformin.      Relevant Orders   Lipid panel   Hemoglobin A1c   Comprehensive metabolic panel   Preventative health care - Primary    Immunizations UTD. Influenza vaccine  provided today. Pap smear UTD, follows with GYN  Discussed the importance of a healthy diet and regular exercise in order for weight loss, and to reduce the risk of further co-morbidity.  Exam stable. Labs pending.  Follow up in 1 year for repeat physical.       Other Visit Diagnoses     Anemia, unspecified type       Relevant Orders   CBC   IBC + Ferritin          Doreene Nest, NP

## 2022-02-14 NOTE — Assessment & Plan Note (Signed)
Uncontrolled. Follows with Neurology.

## 2022-02-14 NOTE — Assessment & Plan Note (Signed)
Continued.  Trying to become pregnant.  Repeat A1C pending. Consider metformin

## 2022-02-14 NOTE — Assessment & Plan Note (Signed)
Insurance will not cover injectable weight loss meds. She will contact insurance to find out what they will cover.  Will likely start with Metformin.

## 2022-02-18 ENCOUNTER — Other Ambulatory Visit (INDEPENDENT_AMBULATORY_CARE_PROVIDER_SITE_OTHER): Payer: Self-pay | Admitting: Primary Care

## 2022-02-18 ENCOUNTER — Other Ambulatory Visit (HOSPITAL_COMMUNITY): Payer: Self-pay

## 2022-02-18 MED ORDER — ALBUTEROL SULFATE HFA 108 (90 BASE) MCG/ACT IN AERS
2.0000 | INHALATION_SPRAY | RESPIRATORY_TRACT | 2 refills | Status: DC | PRN
  Filled 2022-02-18: qty 6.7, 17d supply, fill #0

## 2022-02-19 ENCOUNTER — Other Ambulatory Visit (HOSPITAL_COMMUNITY): Payer: Self-pay

## 2022-02-21 ENCOUNTER — Other Ambulatory Visit (HOSPITAL_COMMUNITY): Payer: Self-pay

## 2022-02-26 ENCOUNTER — Other Ambulatory Visit (HOSPITAL_COMMUNITY): Payer: Self-pay

## 2022-02-27 DIAGNOSIS — R7303 Prediabetes: Secondary | ICD-10-CM

## 2022-02-27 DIAGNOSIS — E282 Polycystic ovarian syndrome: Secondary | ICD-10-CM

## 2022-02-27 DIAGNOSIS — G43711 Chronic migraine without aura, intractable, with status migrainosus: Secondary | ICD-10-CM

## 2022-02-27 MED ORDER — METFORMIN HCL ER 500 MG PO TB24
500.0000 mg | ORAL_TABLET | Freq: Every day | ORAL | 1 refills | Status: DC
  Filled 2022-02-27: qty 30, 30d supply, fill #0
  Filled 2022-03-30: qty 30, 30d supply, fill #1

## 2022-02-28 ENCOUNTER — Other Ambulatory Visit (HOSPITAL_COMMUNITY): Payer: Self-pay

## 2022-03-15 ENCOUNTER — Other Ambulatory Visit (HOSPITAL_COMMUNITY): Payer: Self-pay

## 2022-03-15 ENCOUNTER — Ambulatory Visit (HOSPITAL_COMMUNITY): Payer: BC Managed Care – PPO

## 2022-03-15 MED ORDER — SULFAMETHOXAZOLE-TRIMETHOPRIM 800-160 MG PO TABS
1.0000 | ORAL_TABLET | Freq: Two times a day (BID) | ORAL | 0 refills | Status: DC
  Filled 2022-03-15: qty 14, 7d supply, fill #0

## 2022-03-21 ENCOUNTER — Other Ambulatory Visit (HOSPITAL_COMMUNITY): Payer: Self-pay

## 2022-03-29 ENCOUNTER — Ambulatory Visit: Payer: BC Managed Care – PPO | Admitting: Primary Care

## 2022-05-22 ENCOUNTER — Telehealth: Payer: Self-pay | Admitting: Primary Care

## 2022-05-22 ENCOUNTER — Other Ambulatory Visit: Payer: Self-pay

## 2022-05-22 ENCOUNTER — Other Ambulatory Visit (HOSPITAL_COMMUNITY): Payer: Self-pay

## 2022-05-22 ENCOUNTER — Ambulatory Visit (INDEPENDENT_AMBULATORY_CARE_PROVIDER_SITE_OTHER): Payer: BC Managed Care – PPO

## 2022-05-22 ENCOUNTER — Ambulatory Visit (HOSPITAL_COMMUNITY)
Admission: EM | Admit: 2022-05-22 | Discharge: 2022-05-22 | Disposition: A | Discharge status: Home / Self Care | Payer: BC Managed Care – PPO | Attending: Family Medicine | Admitting: Family Medicine

## 2022-05-22 ENCOUNTER — Encounter (HOSPITAL_COMMUNITY): Payer: Self-pay | Admitting: *Deleted

## 2022-05-22 DIAGNOSIS — R1110 Vomiting, unspecified: Secondary | ICD-10-CM

## 2022-05-22 DIAGNOSIS — K5900 Constipation, unspecified: Secondary | ICD-10-CM | POA: Diagnosis not present

## 2022-05-22 DIAGNOSIS — R1111 Vomiting without nausea: Secondary | ICD-10-CM | POA: Diagnosis not present

## 2022-05-22 LAB — POC URINE PREG, ED: Preg Test, Ur: NEGATIVE

## 2022-05-22 MED ORDER — OMEPRAZOLE 20 MG PO CPDR
20.0000 mg | DELAYED_RELEASE_CAPSULE | Freq: Every day | ORAL | 0 refills | Status: DC
  Filled 2022-05-22: qty 14, 14d supply, fill #0

## 2022-05-22 NOTE — Telephone Encounter (Addendum)
Received access nurse note that pt wanted to scheduled same day ov. Spoke to pt, pt only wanted to see Carlis Abbott only. Pt stated she was on the way to a urgent care. Call back # [phone]

## 2022-05-22 NOTE — Telephone Encounter (Signed)
Per chart review patient is currently at urgent care.

## 2022-05-22 NOTE — ED Triage Notes (Signed)
Pt reports a possible pregnancy and vomiting.

## 2022-05-22 NOTE — ED Provider Notes (Signed)
[facility]    CSN: [id number] Arrival date & time: 05/22/22  [id number]      History   Chief Complaint Chief Complaint  Patient presents with   Possible Pregnancy   Emesis   Headache    HPI Felicia Salinas is a 32 y.o. female.   Patient is here for vomiting.  Worried she may be pregnant.  Her LMP was last week.  She gets vomiting "out of no where".  She was eating rice and suddenly she threw that up.  Another time she ate fried fish and vomited.  She vomited this morning as well.  Has had random vomiting the three times in the last week.  She otherwise feels well.  No abd pain. Just fatigued. She does not have a gallbladder.  No gerd symptoms. No diarrhea.  Some constipation but does not last.  Last bm was this morning, and vomited afterward.  No new/different foods.  No changes in medications lately.  Only on effexor, same dosage.  No weight changes.         Past Medical History:  Diagnosis Date   Angio-edema    Anxiety    Asthma    controlled per patient last attack in January   Environmental allergies    Obesity    Urticaria     Patient Active Problem List   Diagnosis Date Noted   Preventative health care 02/14/2022   Prediabetes 09/01/2021   Hemorrhoids 04/18/2021   Morbid obesity with BMI of 45.0-49.9, adult ([place]) 12/27/2020   Anxiety and depression 12/27/2020   Other allergic rhinitis 07/04/2020   Lactose intolerance 07/04/2020   Adverse reaction to food, subsequent encounter 07/04/2020   Urticaria 07/04/2020   Hirsutism 05/06/2020   Infertility, female 05/06/2020   Empty sella turcica ([place]) 05/06/2020   Chronic migraine without aura, with intractable migraine, so stated, with status migrainosus 10/16/2019   OSA (obstructive sleep apnea) 10/16/2019   Insomnia 10/16/2019   Asthma 09/29/2018   Polycystic ovary syndrome 09/29/2018    Past Surgical History:  Procedure Laterality Date   APPENDECTOMY  04/07/2011   CHOLECYSTECTOMY  10/18/2011    Procedure: LAPAROSCOPIC CHOLECYSTECTOMY WITH INTRAOPERATIVE CHOLANGIOGRAM;  Surgeon: Rolm Bookbinder, MD;  Location: [facility];  Service: General;  Laterality: N/A;   IR ANGIO INTRA EXTRACRAN SEL COM CAROTID INNOMINATE UNI R MOD SED  06/02/2019   IR ANGIO INTRA EXTRACRAN SEL INTERNAL CAROTID UNI L MOD SED  06/02/2019   IR ANGIO VERTEBRAL SEL VERTEBRAL UNI R MOD SED  06/02/2019   IR US GUIDE VASC ACCESS RIGHT  06/02/2019   TONSILLECTOMY     WISDOM TOOTH EXTRACTION      OB History   No obstetric history on file.      Home Medications    Prior to Admission medications   Medication Sig Start Date End Date Taking? Authorizing Provider  albuterol (VENTOLIN HFA) 108 (90 Base) MCG/ACT inhaler Inhale 2 puffs into the lungs every 4 hours as needed for wheezing or shortness of breath (coughing). 02/18/22   Kerin Perna, NP  Erenumab-aooe (AIMOVIG) 140 MG/ML SOAJ Inject 140 mg into the skin every 30 (thirty) days. 08/08/21   Star Age, MD  fluticasone (FLONASE) 50 MCG/ACT nasal spray Place 1 spray into both nostrils 2 (two) times daily as needed for rhinitis. Patient not taking: Reported on 02/14/2022 07/04/20   Garnet Sierras, DO  hydrocortisone (ANUSOL-HC) 25 MG suppository Unwrap and insert 1 suppository rectally 2 times daily. 09/01/21   Carlis Abbott,  Leticia Penna, NP  hydrOXYzine (ATARAX) 10 MG tablet Take 1 tablet by mouth 2 (two) times daily as needed for anxiety. Patient not taking: Reported on 02/14/2022 05/12/21   Pleas Koch, NP  Insulin Pen Needle (UNIFINE PENTIPS) 31G X 6 MM MISC Use as directed with Saxenda 09/25/21   Pleas Koch, NP  levocetirizine (XYZAL) 5 MG tablet Take 1 tablet (5 mg total) by mouth every evening. Patient not taking: Reported on 01/09/2022 07/04/20   Garnet Sierras, DO  metFORMIN (GLUCOPHAGE-XR) 500 MG 24 hr tablet Take 1 tablet (500 mg total) by mouth daily with breakfast. 02/27/22   Pleas Koch, NP  sulfamethoxazole-trimethoprim (BACTRIM DS) 800-160 MG tablet  Take 1 tablet by mouth every 12 (twelve) hours for 7 days 03/15/22     venlafaxine XR (EFFEXOR-XR) 150 MG 24 hr capsule Take 1 capsule (150 mg total) by mouth daily with breakfast for anxiety and depression. 09/01/21   Pleas Koch, NP    Family History Family History  Problem Relation Age of Onset   Breast cancer Mother        mets to spine   Asthma Mother    Asthma Father    Diabetes type II Sister    Breast cancer Maternal Grandmother    Diabetes Maternal Grandmother    Rheum arthritis Maternal Grandmother    Hypertension Maternal Grandfather    Diabetes Paternal Grandfather    Ovarian cancer Maternal Aunt    Ovarian cancer Paternal Aunt    Colon cancer Neg Hx     Social History Social History   Tobacco Use   Smoking status: Never   Smokeless tobacco: Never  Vaping Use   Vaping Use: Never used  Substance Use Topics   Alcohol use: Not Currently    Comment: Occasional   Drug use: No     Allergies   Dairy aid [tilactase], Other, Shellfish allergy, Benadryl [diphenhydramine hcl], Iodine, and Tape   Review of Systems Review of Systems  Constitutional: Negative.   HENT: Negative.    Respiratory: Negative.    Cardiovascular: Negative.   Gastrointestinal:  Positive for constipation and vomiting. Negative for abdominal pain.  Genitourinary: Negative.   Musculoskeletal: Negative.   Skin: Negative.   Neurological:  Positive for headaches.  Hematological: Negative.   Psychiatric/Behavioral: Negative.       Physical Exam Triage Vital Signs ED Triage Vitals  Enc Vitals Group     BP 05/22/22 0932 126/88     Pulse Rate 05/22/22 0932 74     Resp 05/22/22 0932 18     Temp 05/22/22 0932 98.2 F (36.8 C)     Temp src --      SpO2 05/22/22 0932 99 %     Weight --      Height --      Head Circumference --      Peak Flow --      Pain Score 05/22/22 0930 0     Pain Loc --      Pain Edu? --      Excl. in [place]? --    No data found.  Updated Vital Signs BP  126/88   Pulse 74   Temp 98.2 F (36.8 C)   Resp 18   SpO2 99%   Visual Acuity Right Eye Distance:   Left Eye Distance:   Bilateral Distance:    Right Eye Near:   Left Eye Near:    Bilateral Near:  Physical Exam Constitutional:      Appearance: Normal appearance.  HENT:     Mouth/Throat:     Mouth: Mucous membranes are moist.  Cardiovascular:     Rate and Rhythm: Normal rate and regular rhythm.  Pulmonary:     Effort: Pulmonary effort is normal.     Breath sounds: Normal breath sounds.  Abdominal:     Palpations: Abdomen is soft.     Tenderness: There is no abdominal tenderness. There is no guarding or rebound.  Musculoskeletal:     Cervical back: Normal range of motion and neck supple.  Skin:    General: Skin is warm.  Neurological:     General: No focal deficit present.     Mental Status: She is alert.  Psychiatric:        Mood and Affect: Mood normal.      UC Treatments / Results  Labs (all labs ordered are listed, but only abnormal results are displayed) Labs Reviewed  POC URINE PREG, ED    EKG   Radiology DG Abd 1 View  Result Date: 05/22/2022 CLINICAL DATA:  Constipation, vomiting. EXAM: ABDOMEN - 1 VIEW COMPARISON:  KUB 11/05/2012 FINDINGS: There is a nonobstructive bowel gas pattern. There is no abnormally large colonic stool burden. There is no gross organomegaly or abnormal soft tissue calcification. Right upper quadrant surgical clips are noted. The imaged lung bases are clear. There is no acute osseous abnormality. IMPRESSION: Unremarkable KUB with no acute finding. Electronically Signed   By: Valetta Mole M.D.   On: 05/22/2022 10:48    Procedures Procedures (including critical care time)  Medications Ordered in UC Medications - No data to display  Initial Impression / Assessment and Plan / UC Course  I have reviewed the triage vital signs and the nursing notes.  Pertinent labs & imaging results that were available during my care of  the patient were reviewed by me and considered in my medical decision making (see chart for details).  Patient seen today for emesis without any other symptoms.  No abd pain, no nausea prior.  Not typical for viral gastroenteritis.  Pregnancy test was negative.  No alarming symptoms that warrant immediate further evaluation.  I am treating her for possible gerd symptoms with omperazole daily x 2 weeks. If this does not improve then she should follow up with her primary care provider for further care and discussin.   Final Clinical Impressions(s) / UC Diagnoses   Final diagnoses:  Vomiting without nausea, unspecified vomiting type     Discharge Instructions      You were seen today for vomiting.  Your pregnancy test was negative.  You exam overall is normal.  Your xray was normal.  I have sent out a medication to take daily x 2 weeks to see if helpful.  If not, then please follow up with your primary care provider for further discussion and work up if needed.     ED Prescriptions     Medication Sig Dispense Auth. Provider   omeprazole (PRILOSEC) 20 MG capsule Take 1 capsule (20 mg total) by mouth daily. 14 capsule Rondel Oh, MD      PDMP not reviewed this encounter.   Rondel Oh, MD 05/22/22 1054

## 2022-05-22 NOTE — Discharge Instructions (Addendum)
You were seen today for vomiting.  Your pregnancy test was negative.  You exam overall is normal.  Your xray was normal.  I have sent out a medication to take daily x 2 weeks to see if helpful.  If not, then please follow up with your primary care provider for further discussion and work up if needed.

## 2022-05-22 NOTE — ED Triage Notes (Signed)
Pt reports she can not provide a urine sample . Pt provided with water  .

## 2022-05-30 ENCOUNTER — Other Ambulatory Visit (HOSPITAL_COMMUNITY): Payer: Self-pay

## 2022-06-14 ENCOUNTER — Ambulatory Visit: Payer: BC Managed Care – PPO | Admitting: Primary Care

## 2022-06-14 ENCOUNTER — Encounter: Payer: Self-pay | Admitting: Primary Care

## 2022-06-14 ENCOUNTER — Other Ambulatory Visit (HOSPITAL_COMMUNITY): Payer: Self-pay

## 2022-06-14 VITALS — BP 128/84 | HR 63 | Temp 97.2°F | Ht 64.0 in | Wt 278.0 lb

## 2022-06-14 DIAGNOSIS — F419 Anxiety disorder, unspecified: Secondary | ICD-10-CM | POA: Diagnosis not present

## 2022-06-14 DIAGNOSIS — F32A Depression, unspecified: Secondary | ICD-10-CM | POA: Diagnosis not present

## 2022-06-14 MED ORDER — SERTRALINE HCL 50 MG PO TABS
50.0000 mg | ORAL_TABLET | Freq: Every day | ORAL | 0 refills | Status: DC
  Filled 2022-06-14: qty 30, 30d supply, fill #0
  Filled 2022-07-21 – 2022-08-23 (×2): qty 30, 30d supply, fill #1

## 2022-06-14 MED ORDER — VENLAFAXINE HCL ER 37.5 MG PO CP24
ORAL_CAPSULE | ORAL | 0 refills | Status: DC
  Filled 2022-06-14: qty 21, 14d supply, fill #0
  Filled 2022-06-14: qty 21, 7d supply, fill #0

## 2022-06-14 MED ORDER — BUSPIRONE HCL 5 MG PO TABS
5.0000 mg | ORAL_TABLET | Freq: Two times a day (BID) | ORAL | 0 refills | Status: AC
  Filled 2022-06-14: qty 60, 30d supply, fill #0
  Filled 2022-07-21 – 2022-09-24 (×3): qty 60, 30d supply, fill #1
  Filled 2022-11-27: qty 60, 30d supply, fill #2

## 2022-06-14 NOTE — Progress Notes (Signed)
Subjective:    Patient ID: Felicia Salinas, female    DOB: 04/10/1990, 32 y.o.   MRN: [id number]  HPI  Felicia Salinas is a very pleasant 32 y.o. female with a history of migraines, asthma, OSA, PCOS, prediabetes, anxiety and depression who presents today to discus anxiety and depression.  Currently managed on venlafaxine ER 150 mg for which she has been taking for about 1 year in different doses. She has tired Zoloft and Lexapro in the past, Zoloft helped with depression and not anxiety and Lexapro was ineffective.   Today she wants to switch from the venlafaxine to something else. She doesn't feel that the venlafaxine helps with anxiety or depression. She continues to experience worrying a lot, thinking worst case scenario, irritability, is argumentative with spouse daily, feeling down, decreased motivation to do things.  She denies SI/HI and any other negative effects.   Review of Systems  Cardiovascular:  Negative for chest pain.  Gastrointestinal:  Negative for nausea.  Psychiatric/Behavioral:  Negative for suicidal ideas. The patient is nervous/anxious.          Past Medical History:  Diagnosis Date   Angio-edema    Anxiety    Asthma    controlled per patient last attack in January   Environmental allergies    Obesity    Urticaria     Social History   Socioeconomic History   Marital status: Married    Spouse name: Not on file   Number of children: 0   Years of education: Not on file   Highest education level: Not on file  Occupational History   Occupation: representative    Employer: [employer]  Tobacco Use   Smoking status: Never   Smokeless tobacco: Never  Vaping Use   Vaping Use: Never used  Substance and Sexual Activity   Alcohol use: Not Currently    Comment: Occasional   Drug use: No   Sexual activity: Yes    Birth control/protection: None  Other Topics Concern   Not on file  Social History Narrative   Lives at home with husband and cat   Left handed    Caffeine: frappe occasionally    Social Determinants of Health   Financial Resource Strain: Not on file  Food Insecurity: Not on file  Transportation Needs: Not on file  Physical Activity: Not on file  Stress: Not on file  Social Connections: Not on file  Intimate Partner Violence: Not on file    Past Surgical History:  Procedure Laterality Date   APPENDECTOMY  04/07/2011   CHOLECYSTECTOMY  10/18/2011   Procedure: LAPAROSCOPIC CHOLECYSTECTOMY WITH INTRAOPERATIVE CHOLANGIOGRAM;  Surgeon: Rolm Bookbinder, MD;  Location: [facility];  Service: General;  Laterality: N/A;   IR ANGIO INTRA EXTRACRAN SEL COM CAROTID INNOMINATE UNI R MOD SED  06/02/2019   IR ANGIO INTRA EXTRACRAN SEL INTERNAL CAROTID UNI L MOD SED  06/02/2019   IR ANGIO VERTEBRAL SEL VERTEBRAL UNI R MOD SED  06/02/2019   IR US GUIDE VASC ACCESS RIGHT  06/02/2019   TONSILLECTOMY     WISDOM TOOTH EXTRACTION      Family History  Problem Relation Age of Onset   Breast cancer Mother        mets to spine   Asthma Mother    Asthma Father    Diabetes type II Sister    Breast cancer Maternal Grandmother    Diabetes Maternal Grandmother    Rheum arthritis Maternal Grandmother    Hypertension Maternal Grandfather  Diabetes Paternal Grandfather    Ovarian cancer Maternal Aunt    Ovarian cancer Paternal Aunt    Colon cancer Neg Hx     Allergies  Allergen Reactions   Dairy Aid [Tilactase] Anaphylaxis   Other Hives and Anaphylaxis     shrimp, dairy products, shellfish (Swelling) Other reaction(s): Hives   Shellfish Allergy Anaphylaxis   Benadryl [Diphenhydramine Hcl] Hives   Iodine Hives and Rash   Tape Rash    Current Outpatient Medications on File Prior to Visit  Medication Sig Dispense Refill   omeprazole (PRILOSEC) 20 MG capsule Take 1 capsule (20 mg total) by mouth daily. 14 capsule 0   No current facility-administered medications on file prior to visit.    BP 128/84   Pulse 63   Temp (!) 97.2 F (36.2 C)  (Temporal)   Ht 5\' 4"  (1.626 m)   Wt 278 lb (126.1 kg)   LMP 06/08/2022 (Exact Date)   SpO2 99%   BMI 47.72 kg/m  Objective:   Physical Exam Cardiovascular:     Rate and Rhythm: Normal rate and regular rhythm.  Pulmonary:     Effort: Pulmonary effort is normal.     Breath sounds: Normal breath sounds.  Musculoskeletal:     Cervical back: Neck supple.  Skin:    General: Skin is warm and dry.           Assessment & Plan:  Anxiety and depression Assessment & Plan: Uncontrolled.  Given that Zoloft was effective for depression, will re-initiate, she agrees.  Wean off venlafaxine ER 150 mg, instructions provided. Start Zoloft 50 mg, 25 mg daily x 1 week, then 50 mg thereafter. Start buspirone 5 mg 1-2 times daily.   Follow up in 1 month.  Orders: -     Venlafaxine HCl ER; Take 2 capsules daily for one week, then reduce to one capsule daily for one week, then stop.  Dispense: 21 capsule; Refill: 0 -     busPIRone HCl; Take 1 tablet (5 mg total) by mouth 2 (two) times daily. For anxiety  Dispense: 180 tablet; Refill: 0 -     Sertraline HCl; Take 1 tablet (50 mg total) by mouth daily. for anxiety and depression.  Dispense: 90 tablet; Refill: 0        Pleas Koch, NP

## 2022-06-14 NOTE — Assessment & Plan Note (Signed)
Uncontrolled.  Given that Zoloft was effective for depression, will re-initiate, she agrees.  Wean off venlafaxine ER 150 mg, instructions provided. Start Zoloft 50 mg, 25 mg daily x 1 week, then 50 mg thereafter. Start buspirone 5 mg 1-2 times daily.   Follow up in 1 month.

## 2022-06-14 NOTE — Patient Instructions (Signed)
Wean off of venlafaxine ER 150 mg. Start venlafaxine ER 37.5 mg. Take 2 capsules (75 mg) daily for one week, then reduce to 37.5 mg (1 capsule) daily for one week, then stop.  Start sertraline (Zoloft) 50 mg for anxiety and depression. Take 1/2 tablet by mouth once daily for about one week, then increase to 1 full tablet thereafter.   Start buspirone 5 mg daily for a few days, then increase to twice daily if needed.  Schedule a 1 month follow up visit. This can be virtual.   It was a pleasure to see you today!

## 2022-06-16 ENCOUNTER — Other Ambulatory Visit (HOSPITAL_COMMUNITY): Payer: Self-pay

## 2022-06-18 ENCOUNTER — Other Ambulatory Visit (HOSPITAL_COMMUNITY): Payer: Self-pay

## 2022-07-21 ENCOUNTER — Other Ambulatory Visit (HOSPITAL_COMMUNITY): Payer: Self-pay

## 2022-07-30 ENCOUNTER — Other Ambulatory Visit (HOSPITAL_COMMUNITY): Payer: Self-pay

## 2022-08-23 ENCOUNTER — Other Ambulatory Visit (HOSPITAL_COMMUNITY): Payer: Self-pay

## 2022-08-29 ENCOUNTER — Other Ambulatory Visit: Payer: Self-pay

## 2022-08-29 ENCOUNTER — Encounter: Payer: Self-pay | Admitting: Primary Care

## 2022-08-29 ENCOUNTER — Telehealth (INDEPENDENT_AMBULATORY_CARE_PROVIDER_SITE_OTHER): Payer: Self-pay | Admitting: Primary Care

## 2022-08-29 ENCOUNTER — Other Ambulatory Visit (HOSPITAL_COMMUNITY): Payer: Self-pay

## 2022-08-29 VITALS — Ht 64.0 in | Wt 278.0 lb

## 2022-08-29 DIAGNOSIS — N979 Female infertility, unspecified: Secondary | ICD-10-CM

## 2022-08-29 DIAGNOSIS — F419 Anxiety disorder, unspecified: Secondary | ICD-10-CM

## 2022-08-29 DIAGNOSIS — F32A Depression, unspecified: Secondary | ICD-10-CM

## 2022-08-29 DIAGNOSIS — Z6841 Body Mass Index (BMI) 40.0 and over, adult: Secondary | ICD-10-CM

## 2022-08-29 DIAGNOSIS — G4733 Obstructive sleep apnea (adult) (pediatric): Secondary | ICD-10-CM

## 2022-08-29 DIAGNOSIS — R7303 Prediabetes: Secondary | ICD-10-CM

## 2022-08-29 MED ORDER — SERTRALINE HCL 100 MG PO TABS
100.0000 mg | ORAL_TABLET | Freq: Every day | ORAL | 0 refills | Status: DC
  Filled 2022-08-29: qty 90, 90d supply, fill #0
  Filled 2022-09-24: qty 30, 30d supply, fill #0
  Filled 2022-11-02: qty 30, 30d supply, fill #1
  Filled 2022-11-27: qty 30, 30d supply, fill #2

## 2022-08-29 MED ORDER — SEMAGLUTIDE-WEIGHT MANAGEMENT 0.25 MG/0.5ML ~~LOC~~ SOAJ
0.2500 mg | SUBCUTANEOUS | 0 refills | Status: DC
  Filled 2022-08-29: qty 6, 84d supply, fill #0
  Filled 2022-09-24 (×2): qty 2, 28d supply, fill #0

## 2022-08-29 NOTE — Assessment & Plan Note (Signed)
Depression improved, not quite at goal. Anxiety uncontrolled.  Increase Zoloft to 75 mg daily x 1 week, then increase to 100 mg thereafter.  New prescription sent to pharmacy.  She will try buspirone 5 mg once to twice daily and update in 1 week. Will also be increasing her Zoloft for this may help.  Follow-up in 1 month.

## 2022-08-29 NOTE — Patient Instructions (Addendum)
Start semaglutide [facility]) for diabetes/weight loss. Start by injecting 0.25 mg into the skin once weekly for 4 weeks, then increase to 0.5 mg once weekly thereafter. Please notify me once you've used your last 0.5 mg pen so that I can prescribe the next dose.   Increase your Zoloft for anxiety/depression.  Take 1-1/2 of your 50 mg tablets daily for 1 week, then increase to 100 mg daily thereafter.  I sent a new prescription for the 100 mg tablet to your pharmacy.  Only take one of the 100 mg tablets once daily.  Please schedule follow-up visit for 1 month.  It was a pleasure to see you today!

## 2022-08-29 NOTE — Assessment & Plan Note (Signed)
Discussed options for treatment, will start [facility].  Start semaglutide [facility]) for diabetes/weight loss. Start by injecting 0.25 mg into the skin once weekly for 4 weeks, then increase to 0.5 mg once weekly thereafter.   Follow-up in 1 month.

## 2022-08-29 NOTE — Progress Notes (Signed)
Patient ID: Felicia Salinas, female    DOB: [birth year], 32 y.o.   MRN: [id number]  Virtual visit completed through [employer], a video enabled telemedicine application. Due to national recommendations of social distancing due to COVID-19, a virtual visit is felt to be most appropriate for this patient at this time. Reviewed limitations, risks, security and privacy concerns of performing a virtual visit and the availability of in person appointments. I also reviewed that there may be a patient responsible charge related to this service. The patient agreed to proceed.   Patient location: home Provider location: [place] [facility], office Persons participating in this virtual visit: patient, provider   If any vitals were documented, they were collected by patient at home unless specified below.    Ht 5\' 4"  (1.626 m)   Wt 278 lb (126.1 kg)   BMI 47.72 kg/m    CC: Anxiety and Depression follow up and Obesity. Subjective:   HPI: Felicia Salinas is a 32 y.o. female with a history of OSA, asthma, anxiety and depression, prediabetes, morbid obesity presenting on 08/29/2022 for Medical Management of Chronic Issues (Anxiety/depression f/u)  1) Anxiety and Depression: She was last evaluated on 06/14/2022 for symptoms of anxiety and depression. Symptoms included worrying, thinking worst-case scenario, irritability, feeling down/depressed despite management on venlafaxine ER 150 mg.  She had failed Zoloft and Lexapro in the past, although Zoloft had helped with depression but not anxiety.  Given her ongoing symptoms we weaned her off venlafaxine, initiated Zoloft 50 mg slowly, initiated buspirone 5 mg twice daily.  She is here for follow-up today.  Since her last visit she's been taking Zoloft 50 mg daily. She continues to feel anxious and anxiety levels will wax and wane. She has felt improved in terms of her depression, but doesn't feel quite at goal. She continues to worry a lot, thinking worst case  scenario.   She took one dose of buspirone, hasn't taken more because it caused her to feel "odd".  She plans on resuming this medication to see if it helps.  2) Morbid Obesity: Chronic for years despite multiple attempts to lose weight. She's tried [place] but couldn't tolerate dose increases and experienced constipation.  Her prior insurance would not cover Wegovy, but she is with a new insurance company now.  Lately, she's been increasing water intake, limiting portions, and trying to get some activity.  She would like to try [facility] and is motivated for weight loss.  Body mass index is 47.72 kg/m. Wt Readings from Last 3 Encounters:  08/29/22 278 lb (126.1 kg)  06/14/22 278 lb (126.1 kg)  02/14/22 279 lb (126.6 kg)          Relevant past medical, surgical, family and social history reviewed and updated as indicated. Interim medical history since our last visit reviewed. Allergies and medications reviewed and updated. Outpatient Medications Prior to Visit  Medication Sig Dispense Refill   busPIRone (BUSPAR) 5 MG tablet Take 1 tablet (5 mg total) by mouth 2 (two) times daily for anxiety. 180 tablet 0   sertraline (ZOLOFT) 50 MG tablet Take 1 tablet (50 mg total) by mouth daily. for anxiety and depression. 90 tablet 0   venlafaxine XR (EFFEXOR-XR) 37.5 MG 24 hr capsule Take 2 capsules daily for one week, then reduce to one capsule daily for one week, then stop. 21 capsule 0   No facility-administered medications prior to visit.     Per HPI unless specifically indicated in ROS section below  Review of Systems  Gastrointestinal:  Negative for abdominal pain and nausea.  Neurological:  Positive for headaches.  Psychiatric/Behavioral:  The patient is nervous/anxious.        See HPI   Objective:  Ht 5\' 4"  (1.626 m)   Wt 278 lb (126.1 kg)   BMI 47.72 kg/m   Wt Readings from Last 3 Encounters:  08/29/22 278 lb (126.1 kg)  06/14/22 278 lb (126.1 kg)  02/14/22 279 lb (126.6 kg)        Physical exam: General: Alert and oriented x 3, no distress, does not appear sickly  Pulmonary: Speaks in complete sentences without increased work of breathing, no cough during visit.  Psychiatric: Normal mood, thought content, and behavior.     Results for orders placed or performed during the hospital encounter of 05/22/22  POC urine preg, ED (not at [facility])  Result Value Ref Range   Preg Test, Ur NEGATIVE NEGATIVE   Assessment & Plan:   Problem List Items Addressed This Visit       Respiratory   OSA (obstructive sleep apnea)   Relevant Medications   Semaglutide-Weight Management 0.25 MG/0.5ML SOAJ     Genitourinary   Infertility, female   Relevant Medications   Semaglutide-Weight Management 0.25 MG/0.5ML SOAJ     Other   Class 3 severe obesity due to excess calories with body mass index (BMI) of 45.0 to 49.9 in adult [facility]) - Primary    Discussed options for treatment, will start [facility]  Start semaglutide [facility]) for diabetes/weight loss. Start by injecting 0.25 mg into the skin once weekly for 4 weeks, then increase to 0.5 mg once weekly thereafter.   Follow-up in 1 month.       Relevant Medications   Semaglutide-Weight Management 0.25 MG/0.5ML SOAJ   Anxiety and depression    Depression improved, not quite at goal. Anxiety uncontrolled.  Increase Zoloft to 75 mg daily x 1 week, then increase to 100 mg thereafter.  New prescription sent to pharmacy.  She will try buspirone 5 mg once to twice daily and update in 1 week. Will also be increasing her Zoloft for this may help.  Follow-up in 1 month.      Relevant Medications   sertraline (ZOLOFT) 100 MG tablet   Prediabetes   Relevant Medications   Semaglutide-Weight Management 0.25 MG/0.5ML SOAJ     Meds ordered this encounter  Medications   Semaglutide-Weight Management 0.25 MG/0.5ML SOAJ    Sig: Inject 0.25 mg into the skin once a week. Inject 0.25 mg into the skin once weekly for 4 weeks, then  increase to 0.5 mg once weekly thereafter.    Dispense:  6 mL    Refill:  0    Order Specific Question:   Supervising Provider    Answer:   BEDSOLE, AMY E [[id number]]   sertraline (ZOLOFT) 100 MG tablet    Sig: Take 1 tablet (100 mg total) by mouth daily. for anxiety and depression.    Dispense:  90 tablet    Refill:  0    Order Specific Question:   Supervising Provider    Answer:   BEDSOLE, AMY E [[id number]]   No orders of the defined types were placed in this encounter.   I discussed the assessment and treatment plan with the patient. The patient was provided an opportunity to ask questions and all were answered. The patient agreed with the plan and demonstrated an understanding of the instructions. The patient was advised to call  back or seek an in-person evaluation if the symptoms worsen or if the condition fails to improve as anticipated.  Follow up plan:  Start semaglutide [facility]) for diabetes/weight loss. Start by injecting 0.25 mg into the skin once weekly for 4 weeks, then increase to 0.5 mg once weekly thereafter. Please notify me once you've used your last 0.5 mg pen so that I can prescribe the next dose.   Increase your Zoloft for anxiety/depression.  Take 1-1/2 of your 50 mg tablets daily for 1 week, then increase to 100 mg daily thereafter.  I sent a new prescription for the 100 mg tablet to your pharmacy.  Only take one of the 100 mg tablets once daily.  Please schedule follow-up visit for 1 month.  It was a pleasure to see you today!    Doreene Nest, NP

## 2022-08-30 ENCOUNTER — Other Ambulatory Visit (HOSPITAL_COMMUNITY): Payer: Self-pay

## 2022-08-31 ENCOUNTER — Other Ambulatory Visit (HOSPITAL_COMMUNITY): Payer: Self-pay

## 2022-09-03 ENCOUNTER — Other Ambulatory Visit (HOSPITAL_COMMUNITY): Payer: Self-pay

## 2022-09-07 ENCOUNTER — Other Ambulatory Visit (HOSPITAL_COMMUNITY): Payer: Self-pay

## 2022-09-24 ENCOUNTER — Other Ambulatory Visit (HOSPITAL_COMMUNITY): Payer: Self-pay

## 2022-09-25 ENCOUNTER — Other Ambulatory Visit: Payer: Self-pay

## 2022-09-25 ENCOUNTER — Other Ambulatory Visit (HOSPITAL_COMMUNITY): Payer: Self-pay

## 2022-09-28 ENCOUNTER — Other Ambulatory Visit (HOSPITAL_COMMUNITY): Payer: Self-pay

## 2022-10-03 ENCOUNTER — Encounter: Payer: Self-pay | Admitting: Primary Care

## 2022-10-04 ENCOUNTER — Other Ambulatory Visit (HOSPITAL_COMMUNITY): Payer: Self-pay

## 2022-10-08 ENCOUNTER — Other Ambulatory Visit (HOSPITAL_COMMUNITY)
Admission: RE | Admit: 2022-10-08 | Discharge: 2022-10-08 | Disposition: A | Discharge status: Home / Self Care | Payer: 59 | Source: Ambulatory Visit | Attending: Primary Care | Admitting: Primary Care

## 2022-10-08 ENCOUNTER — Encounter: Payer: Self-pay | Admitting: Primary Care

## 2022-10-08 ENCOUNTER — Other Ambulatory Visit (HOSPITAL_COMMUNITY): Payer: Self-pay

## 2022-10-08 ENCOUNTER — Ambulatory Visit (INDEPENDENT_AMBULATORY_CARE_PROVIDER_SITE_OTHER): Payer: 59 | Admitting: Primary Care

## 2022-10-08 VITALS — BP 126/86 | HR 82 | Temp 97.8°F | Ht 65.5 in | Wt 273.4 lb

## 2022-10-08 DIAGNOSIS — R7303 Prediabetes: Secondary | ICD-10-CM

## 2022-10-08 DIAGNOSIS — D509 Iron deficiency anemia, unspecified: Secondary | ICD-10-CM | POA: Diagnosis not present

## 2022-10-08 DIAGNOSIS — F419 Anxiety disorder, unspecified: Secondary | ICD-10-CM | POA: Diagnosis not present

## 2022-10-08 DIAGNOSIS — Z124 Encounter for screening for malignant neoplasm of cervix: Secondary | ICD-10-CM | POA: Diagnosis not present

## 2022-10-08 DIAGNOSIS — J4520 Mild intermittent asthma, uncomplicated: Secondary | ICD-10-CM | POA: Diagnosis not present

## 2022-10-08 DIAGNOSIS — Z6841 Body Mass Index (BMI) 40.0 and over, adult: Secondary | ICD-10-CM | POA: Diagnosis not present

## 2022-10-08 DIAGNOSIS — Z0000 Encounter for general adult medical examination without abnormal findings: Secondary | ICD-10-CM

## 2022-10-08 DIAGNOSIS — Z113 Encounter for screening for infections with a predominantly sexual mode of transmission: Secondary | ICD-10-CM

## 2022-10-08 DIAGNOSIS — F32A Depression, unspecified: Secondary | ICD-10-CM

## 2022-10-08 DIAGNOSIS — G43711 Chronic migraine without aura, intractable, with status migrainosus: Secondary | ICD-10-CM | POA: Diagnosis not present

## 2022-10-08 MED ORDER — TIRZEPATIDE-WEIGHT MANAGEMENT 2.5 MG/0.5ML ~~LOC~~ SOAJ
2.5000 mg | SUBCUTANEOUS | 0 refills | Status: DC
  Filled 2022-10-08: qty 2, 28d supply, fill #0

## 2022-10-08 MED ORDER — PROPRANOLOL HCL ER 80 MG PO CP24
80.0000 mg | ORAL_CAPSULE | Freq: Every day | ORAL | 0 refills | Status: DC
  Filled 2022-10-08: qty 30, 30d supply, fill #0
  Filled 2022-11-02: qty 30, 30d supply, fill #1
  Filled 2022-12-25: qty 30, 30d supply, fill #2

## 2022-10-08 MED ORDER — SUMATRIPTAN SUCCINATE 50 MG PO TABS
ORAL_TABLET | ORAL | 0 refills | Status: DC
  Filled 2022-10-08: qty 4, 10d supply, fill #0
  Filled 2022-11-02: qty 4, 10d supply, fill #1
  Filled 2022-11-27: qty 2, 5d supply, fill #2

## 2022-10-08 NOTE — Progress Notes (Signed)
Subjective:    Patient ID: Felicia Salinas, female    DOB: 08/05/1990, 32 y.o.   MRN: [id number]  HPI  Felicia Salinas is a very pleasant 32 y.o. female who presents today for complete physical and follow up of chronic conditions.  She continues to experience frequent headaches and migraines. Migraines are occurring every other day which are located to the frontal and parietal lobes. She's been taking Tylenol without much improvement. Following with neurology at one point who recommended sleep study and treatment with CPAP machine. She's been using her CPAP and continues to experience migraines. She was once managed on Aimovig which helped, but she hasn't seen neurology as she did not mesh well with her prior neurologist. She was referred to neurology in December 2023, never heard from that referral. She's failed Topamax, amitriptyline, Maxalt, Ubrelvy. She's never tried Imitrex.  Immunizations: -Tetanus: Completed in 2023  Diet: Fair diet.  Exercise: No regular exercise.  Eye exam: Completes annually  Dental exam: Completed several years ago   Pap Smear: Completed in 2020  Wt Readings from Last 3 Encounters:  10/08/22 273 lb 6.4 oz (124 kg)  08/29/22 278 lb (126.1 kg)  06/14/22 278 lb (126.1 kg)       Review of Systems  Constitutional:  Negative for unexpected weight change.  HENT:  Negative for rhinorrhea.   Respiratory:  Negative for cough and shortness of breath.   Cardiovascular:  Negative for chest pain.  Gastrointestinal:  Negative for constipation and diarrhea.  Genitourinary:  Negative for difficulty urinating and menstrual problem.  Musculoskeletal:  Negative for arthralgias and myalgias.  Skin:  Negative for rash.  Allergic/Immunologic: Negative for environmental allergies.  Neurological:  Positive for headaches. Negative for dizziness.         Past Medical History:  Diagnosis Date   Angio-edema    Anxiety    Asthma    controlled per patient last attack in  January   Environmental allergies    Obesity    Urticaria     Social History   Socioeconomic History   Marital status: Married    Spouse name: Not on file   Number of children: 0   Years of education: Not on file   Highest education level: Not on file  Occupational History   Occupation: representative    Employer: [employer]  Tobacco Use   Smoking status: Never   Smokeless tobacco: Never  Vaping Use   Vaping status: Never Used  Substance and Sexual Activity   Alcohol use: Not Currently    Comment: Occasional   Drug use: No   Sexual activity: Yes    Birth control/protection: None  Other Topics Concern   Not on file  Social History Narrative   Lives at home with husband and cat   Left handed   Caffeine: frappe occasionally    Social Determinants of Health   Financial Resource Strain: Not on file  Food Insecurity: Not on file  Transportation Needs: Not on file  Physical Activity: Not on file  Stress: Not on file  Social Connections: Not on file  Intimate Partner Violence: Not on file    Past Surgical History:  Procedure Laterality Date   APPENDECTOMY  04/07/2011   CHOLECYSTECTOMY  10/18/2011   Procedure: LAPAROSCOPIC CHOLECYSTECTOMY WITH INTRAOPERATIVE CHOLANGIOGRAM;  Surgeon: Emelia Loron, MD;  Location: [facility] OR;  Service: General;  Laterality: N/A;   IR ANGIO INTRA EXTRACRAN SEL COM CAROTID INNOMINATE UNI R MOD SED  06/02/2019   IR  ANGIO INTRA EXTRACRAN SEL INTERNAL CAROTID UNI L MOD SED  06/02/2019   IR ANGIO VERTEBRAL SEL VERTEBRAL UNI R MOD SED  06/02/2019   IR US GUIDE VASC ACCESS RIGHT  06/02/2019   TONSILLECTOMY     WISDOM TOOTH EXTRACTION      Family History  Problem Relation Age of Onset   Breast cancer Mother        mets to spine   Asthma Mother    Asthma Father    Diabetes type II Sister    Breast cancer Maternal Grandmother    Diabetes Maternal Grandmother    Rheum arthritis Maternal Grandmother    Hypertension Maternal Grandfather    Diabetes  Paternal Grandfather    Ovarian cancer Maternal Aunt    Ovarian cancer Paternal Aunt    Colon cancer Neg Hx     Allergies  Allergen Reactions   Dairy Aid [Tilactase] Anaphylaxis   Other Hives and Anaphylaxis     shrimp, dairy products, shellfish (Swelling) Other reaction(s): Hives   Shellfish Allergy Anaphylaxis   Benadryl [Diphenhydramine Hcl] Hives   Iodine Hives and Rash   Tape Rash    Current Outpatient Medications on File Prior to Visit  Medication Sig Dispense Refill   busPIRone (BUSPAR) 5 MG tablet Take 1 tablet (5 mg total) by mouth 2 (two) times daily for anxiety. 180 tablet 0   sertraline (ZOLOFT) 100 MG tablet Take 1 tablet (100 mg) by mouth daily for anxiety and depression. 90 tablet 0   No current facility-administered medications on file prior to visit.    BP 126/86   Pulse 82   Temp 97.8 F (36.6 C) (Temporal)   Ht 5' 5.5" (1.664 m)   Wt 273 lb 6.4 oz (124 kg)   LMP 09/21/2022   SpO2 100%   BMI 44.80 kg/m  Objective:   Physical Exam Exam conducted with a chaperone present.  HENT:     Right Ear: Tympanic membrane and ear canal normal.     Left Ear: Tympanic membrane and ear canal normal.     Nose: Nose normal.  Eyes:     Conjunctiva/sclera: Conjunctivae normal.     Pupils: Pupils are equal, round, and reactive to light.  Neck:     Thyroid: No thyromegaly.  Cardiovascular:     Rate and Rhythm: Normal rate and regular rhythm.     Heart sounds: No murmur heard. Pulmonary:     Effort: Pulmonary effort is normal.     Breath sounds: Normal breath sounds. No rales.  Abdominal:     General: Bowel sounds are normal.     Palpations: Abdomen is soft.     Tenderness: There is no abdominal tenderness.  Genitourinary:    Labia:        Right: No tenderness or lesion.        Left: No tenderness or lesion.      Vagina: Vaginal discharge present.     Cervix: Discharge present.     Uterus: Normal.      Adnexa: Right adnexa normal and left adnexa normal.        Comments: Skin tag noted to left perineum. Scant amount of whitish discharge, no foul smell Musculoskeletal:        General: Normal range of motion.     Cervical back: Neck supple.  Lymphadenopathy:     Cervical: No cervical adenopathy.  Skin:    General: Skin is warm and dry.     Findings: No rash.  Neurological:     Mental Status: She is alert and oriented to person, place, and time.     Cranial Nerves: No cranial nerve deficit.     Deep Tendon Reflexes: Reflexes are normal and symmetric.           Assessment & Plan:  Preventative health care Assessment & Plan: Immunizations UTD. Pap smear due, completed today.   Discussed the importance of a healthy diet and regular exercise in order for weight loss, and to reduce the risk of further co-morbidity.  Exam stable. Labs pending.  Follow up in 1 year for repeat physical.    Anxiety and depression Assessment & Plan: Controlled.  Continue Zoloft 100 mg daily. Continue Buspar 5 mg BID PRN.   Chronic migraine without aura, with intractable migraine, so stated, with status migrainosus Assessment & Plan: Uncontrolled.  Failed numerous treatment options. Will refer back to neurology.  Referral placed.  Start propranolol ER 80 mg daily for headache prevention. Start sumatriptan 50 mg as needed for migraine abortion.  Orders: -     Ambulatory referral to Neurology -     SUMAtriptan Succinate; Take 1 tablet by mouth at migraine onset. May repeat in 2 hours if headache persists or recurs.  Dispense: 10 tablet; Refill: 0 -     Propranolol HCl ER; Take 1 capsule (80 mg total) by mouth at bedtime. For headache prevention  Dispense: 90 capsule; Refill: 0  Mild intermittent asthma without complication Assessment & Plan: Controlled.  Infrequent use of albuterol.   Prediabetes Assessment & Plan: Repeat A1c pending.  Orders: -     Tirzepatide-Weight Management; Inject 2.5 mg into the skin once a week.   Dispense: 2 mL; Refill: 0 -     Hemoglobin A1c -     Lipid panel -     Comprehensive metabolic panel  Class 3 severe obesity due to excess calories without serious comorbidity with body mass index (BMI) of 40.0 to 44.9 in adult [facility]) Assessment & Plan: Reginal Lutes not covered by the insurance.  Start Zepbound 2.5 mg weekly x 4 weeks, then increase to 5 mg weekly thereafter. She will update if insurance will not cover.  Orders: -     Tirzepatide-Weight Management; Inject 2.5 mg into the skin once a week.  Dispense: 2 mL; Refill: 0  Iron deficiency anemia, unspecified iron deficiency anemia type -     CBC -     IBC + Ferritin  Screening for cervical cancer -     Cytology - PAP  Screening for STD (sexually transmitted disease) -     RPR -     Hepatitis C antibody -     Herpes Simplex Virus 1 and 2 (IgG), with Reflex to HSV-2 Inhibition -     HIV Antibody (routine testing w rflx)        Doreene Nest, NP

## 2022-10-08 NOTE — Assessment & Plan Note (Signed)
Controlled.  Continue Zoloft 100 mg daily. Continue Buspar 5 mg BID PRN.

## 2022-10-08 NOTE — Patient Instructions (Signed)
Stop by the lab prior to leaving today. I will notify you of your results once received.   Start propranolol ER 80 mg daily for headache prevention.  Start sumatriptan 50 mg as needed for migraine abortion.  Take 1 tablet by mouth at migraine onset, may repeat with second tablet in 2 hours if migraine persist.  Do not exceed 2 tablets in 24 hours.  Start Zepbound 2.5 mg weekly for weight loss.  Inject 2.5 mg into the skin once weekly.  Please message me when you use your last pen so that I can send the next dose.  You will either be contacted via phone regarding your referral to Neurology, or you may receive a letter on your MyChart portal from our referral team with instructions for scheduling an appointment. Please let us know if you have not been contacted by anyone within two weeks.  It was a pleasure to see you today!

## 2022-10-08 NOTE — Assessment & Plan Note (Signed)
Controlled.  Infrequent use of albuterol.

## 2022-10-08 NOTE — Assessment & Plan Note (Signed)
Reginal Lutes not covered by the insurance.  Start Zepbound 2.5 mg weekly x 4 weeks, then increase to 5 mg weekly thereafter. She will update if insurance will not cover.

## 2022-10-08 NOTE — Assessment & Plan Note (Signed)
Uncontrolled.  Failed numerous treatment options. Will refer back to neurology.  Referral placed.  Start propranolol ER 80 mg daily for headache prevention. Start sumatriptan 50 mg as needed for migraine abortion.

## 2022-10-08 NOTE — Assessment & Plan Note (Signed)
 Repeat A1c pending. 

## 2022-10-08 NOTE — Assessment & Plan Note (Signed)
Immunizations UTD. Pap smear due, completed today.  Discussed the importance of a healthy diet and regular exercise in order for weight loss, and to reduce the risk of further co-morbidity.  Exam stable. Labs pending.  Follow up in 1 year for repeat physical.  

## 2022-10-09 ENCOUNTER — Telehealth: Payer: Self-pay

## 2022-10-09 LAB — CBC
HCT: 32.4 % — ABNORMAL LOW (ref 36.0–46.0)
Hemoglobin: 10.4 g/dL — ABNORMAL LOW (ref 12.0–15.0)
MCHC: 32 g/dL (ref 30.0–36.0)
MCV: 87.4 fl (ref 78.0–100.0)
Platelets: 379 10*3/uL (ref 150.0–400.0)
RBC: 3.7 Mil/uL — ABNORMAL LOW (ref 3.87–5.11)
RDW: 16.4 % — ABNORMAL HIGH (ref 11.5–15.5)
WBC: 7.4 10*3/uL (ref 4.0–10.5)

## 2022-10-09 LAB — COMPREHENSIVE METABOLIC PANEL
ALT: 15 U/L (ref 0–35)
AST: 17 U/L (ref 0–37)
Albumin: 4 g/dL (ref 3.5–5.2)
Alkaline Phosphatase: 50 U/L (ref 39–117)
BUN: 11 mg/dL (ref 6–23)
CO2: 28 mEq/L (ref 19–32)
Calcium: 9.4 mg/dL (ref 8.4–10.5)
Chloride: 103 mEq/L (ref 96–112)
Creatinine, Ser: 0.8 mg/dL (ref 0.40–1.20)
GFR: 96.24 mL/min (ref >60.00)
Glucose, Bld: 87 mg/dL (ref 70–99)
Potassium: 3.9 mEq/L (ref 3.5–5.1)
Sodium: 137 mEq/L (ref 135–145)
Total Bilirubin: 0.2 mg/dL (ref 0.2–1.2)
Total Protein: 7.2 g/dL (ref 6.0–8.3)

## 2022-10-09 LAB — LIPID PANEL
Cholesterol: 207 mg/dL — ABNORMAL HIGH (ref 0–200)
HDL: 65.9 mg/dL (ref >39.00)
LDL Cholesterol: 127 mg/dL — ABNORMAL HIGH (ref 0–99)
NonHDL: 141.12
Total CHOL/HDL Ratio: 3
Triglycerides: 70 mg/dL (ref 0.0–149.0)
VLDL: 14 mg/dL (ref 0.0–40.0)

## 2022-10-09 LAB — RPR: RPR Ser Ql: NONREACTIVE

## 2022-10-09 LAB — IBC + FERRITIN
Ferritin: 5.7 ng/mL — ABNORMAL LOW (ref 10.0–291.0)
Iron: 18 ug/dL — ABNORMAL LOW (ref 42–145)
Saturation Ratios: 3.8 % — ABNORMAL LOW (ref 20.0–50.0)
TIBC: 469 ug/dL — ABNORMAL HIGH (ref 250.0–450.0)
Transferrin: 335 mg/dL (ref 212.0–360.0)

## 2022-10-09 LAB — HEMOGLOBIN A1C: Hgb A1c MFr Bld: 5.5 % (ref 4.6–6.5)

## 2022-10-09 LAB — HERPES SIMPLEX VIRUS 1 AND 2 (IGG),REFLEX HSV-2 INHIBITION
HAV 1 IGG,TYPE SPECIFIC AB: 9.78 index — ABNORMAL HIGH
HSV 2 IGG,TYPE SPECIFIC AB: <0.9 index

## 2022-10-09 LAB — HIV ANTIBODY (ROUTINE TESTING W REFLEX): HIV 1&2 Ab, 4th Generation: NONREACTIVE

## 2022-10-09 LAB — HEPATITIS C ANTIBODY: Hepatitis C Ab: NONREACTIVE

## 2022-10-09 NOTE — Telephone Encounter (Signed)
Okay with me, she has not been seen in over 2 years.

## 2022-10-09 NOTE — Telephone Encounter (Signed)
We received a new referral for a patient of Dr Johny Sax, LS 2022 for migraines. She is now being referred back for persistent migraines, failed numerous treatments and is requesting a provider switch from Dr Frances Furbish to Dr Lucia Gaskins. Please advise if this is acceptable

## 2022-10-10 ENCOUNTER — Other Ambulatory Visit (HOSPITAL_COMMUNITY): Payer: Self-pay

## 2022-10-10 ENCOUNTER — Telehealth: Payer: Self-pay | Admitting: Hematology and Oncology

## 2022-10-10 ENCOUNTER — Telehealth: Payer: Self-pay

## 2022-10-10 ENCOUNTER — Telehealth: Payer: Self-pay | Admitting: Primary Care

## 2022-10-10 DIAGNOSIS — D509 Iron deficiency anemia, unspecified: Secondary | ICD-10-CM

## 2022-10-10 DIAGNOSIS — N920 Excessive and frequent menstruation with regular cycle: Secondary | ICD-10-CM

## 2022-10-10 MED ORDER — NORETHIN ACE-ETH ESTRAD-FE 1-20 MG-MCG PO TABS
1.0000 | ORAL_TABLET | Freq: Every day | ORAL | 0 refills | Status: DC
  Filled 2022-10-10: qty 28, 28d supply, fill #0
  Filled 2022-11-02: qty 28, 28d supply, fill #1
  Filled 2022-12-02: qty 28, 28d supply, fill #2

## 2022-10-10 NOTE — Telephone Encounter (Signed)
Spoke with patient confirming upcoming appointment  

## 2022-10-10 NOTE — Telephone Encounter (Signed)
Can we make sure the PA for the Zepbound is done?

## 2022-10-10 NOTE — Telephone Encounter (Signed)
Pharmacy Patient Advocate Encounter   Received notification from Patient Advice Request messages that prior authorization for Zepbound 2.5MG /0.5ML pen-injectors is required/requested.   Insurance verification completed.   The patient is insured through [facility] .   Per test claim: PA submitted to [facility] via CoverMyMeds Key/confirmation #/EOC [id number] Status is pending

## 2022-10-10 NOTE — Telephone Encounter (Signed)
Patient called in and wanted to discuss her lab results. Thank you!

## 2022-10-10 NOTE — Telephone Encounter (Signed)
Called patient she has received message on my chart wanted to let you know the following.   She does have very heavy periods with clotting. Her cycle is monthly and last 3-4 days.  She is ok with moving forward with infusion.

## 2022-10-10 NOTE — Telephone Encounter (Signed)
See my chart message

## 2022-10-11 ENCOUNTER — Other Ambulatory Visit (HOSPITAL_COMMUNITY): Payer: Self-pay

## 2022-10-11 NOTE — Telephone Encounter (Signed)
Call patient, She can see an NP and I will supervise. I would really prefer her to see an NP as she has already been seen here for migraines and tried on even the newest meds. I don't think I have to see her. Schedule her with an NP and if she declines do not schedule her and I can discuss with you.

## 2022-10-11 NOTE — Telephone Encounter (Signed)
Looks like Dr. Frances Salinas was managing her migraines. Since she is still considered a patient(saw her 2 years ago), can't she see an NP? Does she need to see me? Not sure I would do anything different than Dr. Frances Salinas. If she can see an NP, and I don;t see why, I'd schedule her with Megan or AMy thanks

## 2022-10-12 ENCOUNTER — Ambulatory Visit: Payer: 59 | Admitting: Primary Care

## 2022-10-12 LAB — CYTOLOGY - PAP
Adequacy: ABSENT
Chlamydia: NEGATIVE
Comment: NEGATIVE
Comment: NEGATIVE
Comment: NEGATIVE
Comment: NORMAL
Diagnosis: NEGATIVE
High risk HPV: NEGATIVE
Neisseria Gonorrhea: NEGATIVE
Trichomonas: NEGATIVE

## 2022-10-14 NOTE — Telephone Encounter (Signed)
Pharmacy Patient Advocate Encounter  Received notification from [facility] that Prior Authorization for Zepbound 2.5MG /0.5ML pen-injectors has been DENIED because Drug Not Covered/Plan Exclusion - Your request for coverage was denied because your prescription benefit plan does not cover the requested medication.Marland Kitchen  PA #/Case ID/Reference #: [id number]   Please be advised we currently do not have a Pharmacist to review denials, therefore you will need to process appeals accordingly as needed. Thanks for your support at this time. Contact for appeals are as follows: Phone: [phone], Fax: [phone]  Further information is in denial letter attached to patient chart

## 2022-10-15 ENCOUNTER — Other Ambulatory Visit (HOSPITAL_COMMUNITY): Payer: Self-pay

## 2022-10-15 NOTE — Telephone Encounter (Signed)
Please notify patient.

## 2022-10-22 ENCOUNTER — Ambulatory Visit: Payer: 59 | Admitting: Primary Care

## 2022-10-24 ENCOUNTER — Ambulatory Visit: Payer: 59 | Admitting: Primary Care

## 2022-10-25 ENCOUNTER — Encounter: Payer: Self-pay | Admitting: Primary Care

## 2022-11-01 ENCOUNTER — Ambulatory Visit: Payer: 59 | Admitting: Primary Care

## 2022-11-02 ENCOUNTER — Other Ambulatory Visit (HOSPITAL_COMMUNITY): Payer: Self-pay

## 2022-11-02 ENCOUNTER — Other Ambulatory Visit: Payer: Self-pay

## 2022-11-02 ENCOUNTER — Inpatient Hospital Stay: Payer: 59 | Attending: Hematology and Oncology | Admitting: Hematology and Oncology

## 2022-11-02 ENCOUNTER — Inpatient Hospital Stay: Payer: 59

## 2022-11-02 VITALS — BP 120/74 | HR 68 | Temp 99.3°F | Resp 13 | Wt 267.5 lb

## 2022-11-02 DIAGNOSIS — J45909 Unspecified asthma, uncomplicated: Secondary | ICD-10-CM | POA: Insufficient documentation

## 2022-11-02 DIAGNOSIS — D500 Iron deficiency anemia secondary to blood loss (chronic): Secondary | ICD-10-CM | POA: Diagnosis not present

## 2022-11-02 DIAGNOSIS — N920 Excessive and frequent menstruation with regular cycle: Secondary | ICD-10-CM | POA: Insufficient documentation

## 2022-11-02 DIAGNOSIS — E669 Obesity, unspecified: Secondary | ICD-10-CM | POA: Insufficient documentation

## 2022-11-02 DIAGNOSIS — D509 Iron deficiency anemia, unspecified: Secondary | ICD-10-CM | POA: Diagnosis not present

## 2022-11-02 LAB — CBC WITH DIFFERENTIAL (CANCER CENTER ONLY)
Abs Immature Granulocytes: 0.02 10*3/uL (ref 0.00–0.07)
Basophils Absolute: 0.1 10*3/uL (ref 0.0–0.1)
Basophils Relative: 1 %
Eosinophils Absolute: 0.2 10*3/uL (ref 0.0–0.5)
Eosinophils Relative: 3 %
HCT: 31.9 % — ABNORMAL LOW (ref 36.0–46.0)
Hemoglobin: 10.7 g/dL — ABNORMAL LOW (ref 12.0–15.0)
Immature Granulocytes: 0 %
Lymphocytes Relative: 39 %
Lymphs Abs: 2.5 10*3/uL (ref 0.7–4.0)
MCH: 28.6 pg (ref 26.0–34.0)
MCHC: 33.5 g/dL (ref 30.0–36.0)
MCV: 85.3 fL (ref 80.0–100.0)
Monocytes Absolute: 0.5 10*3/uL (ref 0.1–1.0)
Monocytes Relative: 8 %
Neutro Abs: 3.2 10*3/uL (ref 1.7–7.7)
Neutrophils Relative %: 49 %
Platelet Count: 376 10*3/uL (ref 150–400)
RBC: 3.74 MIL/uL — ABNORMAL LOW (ref 3.87–5.11)
RDW: 14.6 % (ref 11.5–15.5)
WBC Count: 6.5 10*3/uL (ref 4.0–10.5)
nRBC: 0 % (ref 0.0–0.2)

## 2022-11-02 LAB — IRON AND IRON BINDING CAPACITY (CC-WL,HP ONLY)
Iron: 32 ug/dL (ref 28–170)
Saturation Ratios: 7 % — ABNORMAL LOW (ref 10.4–31.8)
TIBC: 458 ug/dL — ABNORMAL HIGH (ref 250–450)
UIBC: 426 ug/dL (ref 148–442)

## 2022-11-02 LAB — CMP (CANCER CENTER ONLY)
ALT: 27 U/L (ref 0–44)
AST: 15 U/L (ref 15–41)
Albumin: 4 g/dL (ref 3.5–5.0)
Alkaline Phosphatase: 43 U/L (ref 38–126)
Anion gap: 7 (ref 5–15)
BUN: 6 mg/dL (ref 6–20)
CO2: 28 mmol/L (ref 22–32)
Calcium: 9.2 mg/dL (ref 8.9–10.3)
Chloride: 104 mmol/L (ref 98–111)
Creatinine: 0.84 mg/dL (ref 0.44–1.00)
GFR, Estimated: >60 mL/min (ref >60)
Glucose, Bld: 92 mg/dL (ref 70–99)
Potassium: 3.7 mmol/L (ref 3.5–5.1)
Sodium: 139 mmol/L (ref 135–145)
Total Bilirubin: 0.3 mg/dL (ref 0.3–1.2)
Total Protein: 7.2 g/dL (ref 6.5–8.1)

## 2022-11-02 LAB — RETIC PANEL
Immature Retic Fract: 23.4 % — ABNORMAL HIGH (ref 2.3–15.9)
RBC.: 3.75 MIL/uL — ABNORMAL LOW (ref 3.87–5.11)
Retic Count, Absolute: 58.1 10*3/uL (ref 19.0–186.0)
Retic Ct Pct: 1.6 % (ref 0.4–3.1)
Reticulocyte Hemoglobin: 30.4 pg (ref >27.9)

## 2022-11-02 LAB — FERRITIN: Ferritin: 4 ng/mL — ABNORMAL LOW (ref 11–307)

## 2022-11-02 NOTE — Progress Notes (Signed)
[facility] [phone]   [phone]  INITIAL CONSULT NOTE  Patient Care Team: Doreene Nest, NP as PCP - General (Internal Medicine) Marcelle Overlie, MD as Consulting Physician (Obstetrics and Gynecology)  Hematological/Oncological History # Iron Deficiency Anemia 2/2 to GYN Bleeding  10/08/2022: WBC 7.4. Hgb 10.4, MCV 87.4, Plt 379. Ferritin 5.7, Sat 3.8% 11/02/2022: establish care with Dr. Leonides Schanz   CHIEF COMPLAINTS/PURPOSE OF CONSULTATION:  "Iron Deficiency Anemia  "  HISTORY OF PRESENTING ILLNESS:  Felicia Salinas 32 y.o. female with medical history significant for asthma and obesity who presents for evaluation of iron deficiency anemia.   On review of the previous records this point had labs collected on 10/08/2022 which showed white blood cell 7.4, hemoglobin 10.4, MCV 87.4, platelets of 379 with ferritin of 5.7, and iron sat of 3.8%.  Due to concern for this iron deficiency anemia she was referred to hematology for further evaluation and management.  On exam today Ms. Wooddell notes that this is a new issue.  She only recently learned that her iron levels were low.  About 1 month ago she stopped taking iron pills that she had taken for several weeks as it did not appear to be providing her any benefit.  She notes it was causing some stomach upset and they were difficult to take.  She notes that she was also recently started on birth control by her primary care provider.  She reports her menstrual cycles are quite heavy although they only last 3 to 4 days.  She goes through about 4-5 pads completely soaked on her heaviest days.  She notes that her cycles have always been this heavy.  She notes that she is not having any bleeding anywhere else such as nosebleeds, gum bleeding, or dark stools.  She does enjoy eating red meat and does so nearly every other day.  Includes hamburgers, steak, and she does like broccoli.  She is not having any ice cravings but  does enjoy ice cream.  On further discussion she reports that her mother follows with Dr. Ave Filter for breast cancer and now "bone marrow cancer".  She has also had anemia.  She had a paternal aunt with ovarian cancer and a paternal aunt with lung cancer.  She reports her father recently had back surgery but is otherwise healthy.  She is married without children.  She does not smoke and drinks alcohol socially, generally about once per month.  She reports that she currently works for [employer], and works from home.  She is having issues with lightheadedness, dizziness, and shortness of breath as well as fatigue and headache.  Overall she feels "very tired".  She otherwise denies any fevers, chills, sweats, nausea, vomiting or diarrhea.  A full 10 point ROS is otherwise negative.  MEDICAL HISTORY:  Past Medical History:  Diagnosis Date   Angio-edema    Anxiety    Asthma    controlled per patient last attack in January   Environmental allergies    Obesity    Urticaria     SURGICAL HISTORY: Past Surgical History:  Procedure Laterality Date   APPENDECTOMY  04/07/2011   CHOLECYSTECTOMY  10/18/2011   Procedure: LAPAROSCOPIC CHOLECYSTECTOMY WITH INTRAOPERATIVE CHOLANGIOGRAM;  Surgeon: Emelia Loron, MD;  Location: [facility] OR;  Service: General;  Laterality: N/A;   IR ANGIO INTRA EXTRACRAN SEL COM CAROTID INNOMINATE UNI R MOD SED  06/02/2019   IR ANGIO INTRA EXTRACRAN SEL INTERNAL CAROTID UNI L MOD SED  06/02/2019  IR ANGIO VERTEBRAL SEL VERTEBRAL UNI R MOD SED  06/02/2019   IR US GUIDE VASC ACCESS RIGHT  06/02/2019   TONSILLECTOMY     WISDOM TOOTH EXTRACTION      SOCIAL HISTORY: Social History   Socioeconomic History   Marital status: Married    Spouse name: Not on file   Number of children: 0   Years of education: Not on file   Highest education level: Associate degree: academic program  Occupational History   Occupation: Cabin crew: [employer]  Tobacco Use   Smoking status:  Never   Smokeless tobacco: Never  Vaping Use   Vaping status: Never Used  Substance and Sexual Activity   Alcohol use: Not Currently    Comment: Occasional   Drug use: No   Sexual activity: Yes    Birth control/protection: None  Other Topics Concern   Not on file  Social History Narrative   Lives at home with husband and cat   Left handed   Caffeine: frappe occasionally    Social Determinants of Health   Financial Resource Strain: High Risk (10/21/2022)   Overall Financial Resource Strain (CARDIA)    Difficulty of Paying Living Expenses: Hard  Food Insecurity: Food Insecurity Present (10/21/2022)   Hunger Vital Sign    Worried About Running Out of Food in the Last Year: Often true    Ran Out of Food in the Last Year: Often true  Transportation Needs: No Transportation Needs (11/02/2022)   PRAPARE - Administrator, Civil Service (Medical): No    Lack of Transportation (Non-Medical): No  Physical Activity: Unknown (10/21/2022)   Exercise Vital Sign    Days of Exercise per Week: Patient declined    Minutes of Exercise per Session: Not on file  Stress: Patient Declined (10/21/2022)   [employer] of Occupational Health - Occupational Stress Questionnaire    Feeling of Stress : Patient declined  Social Connections: Moderately Integrated (10/21/2022)   Social Connection and Isolation Panel [NHANES]    Frequency of Communication with Friends and Family: More than three times a week    Frequency of Social Gatherings with Friends and Family: Twice a week    Attends Religious Services: More than 4 times per year    Active Member of [employer] or Organizations: No    Attends Engineer, structural: Not on file    Marital Status: Married  Catering manager Violence: Not At Risk (11/02/2022)   Humiliation, Afraid, Rape, and Kick questionnaire    Fear of Current or Ex-Partner: No    Emotionally Abused: No    Physically Abused: No    Sexually Abused: No    FAMILY  HISTORY: Family History  Problem Relation Age of Onset   Breast cancer Mother        mets to spine   Asthma Mother    Asthma Father    Diabetes type II Sister    Breast cancer Maternal Grandmother    Diabetes Maternal Grandmother    Rheum arthritis Maternal Grandmother    Hypertension Maternal Grandfather    Diabetes Paternal Grandfather    Ovarian cancer Maternal Aunt    Ovarian cancer Paternal Aunt    Colon cancer Neg Hx     ALLERGIES:  is allergic to dairy aid [tilactase], other, shellfish allergy, benadryl [diphenhydramine hcl], iodine, and tape.  MEDICATIONS:  Current Outpatient Medications  Medication Sig Dispense Refill   busPIRone (BUSPAR) 5 MG tablet Take 1 tablet (5  mg total) by mouth 2 (two) times daily for anxiety. 180 tablet 0   norethindrone-ethinyl estradiol-FE (JUNEL FE 1/20) 1-20 MG-MCG tablet Take 1 tablet by mouth daily. 84 tablet 0   propranolol ER (INDERAL LA) 80 MG 24 hr capsule Take 1 capsule (80 mg) by mouth at bedtime. For headache prevention 90 capsule 0   sertraline (ZOLOFT) 100 MG tablet Take 1 tablet (100 mg) by mouth daily for anxiety and depression. 90 tablet 0   SUMAtriptan (IMITREX) 50 MG tablet Take 1 tablet by mouth at migraine onset. May repeat in 2 hours if headache persists or recurs. 10 tablet 0   No current facility-administered medications for this visit.    REVIEW OF SYSTEMS:   Constitutional: ( - ) fevers, ( - )  chills , ( - ) night sweats Eyes: ( - ) blurriness of vision, ( - ) double vision, ( - ) watery eyes Ears, nose, mouth, throat, and face: ( - ) mucositis, ( - ) sore throat Respiratory: ( - ) cough, ( - ) dyspnea, ( - ) wheezes Cardiovascular: ( - ) palpitation, ( - ) chest discomfort, ( - ) lower extremity swelling Gastrointestinal:  ( - ) nausea, ( - ) heartburn, ( - ) change in bowel habits Skin: ( - ) abnormal skin rashes Lymphatics: ( - ) new lymphadenopathy, ( - ) easy bruising Neurological: ( - ) numbness, ( - )  tingling, ( - ) new weaknesses Behavioral/Psych: ( - ) mood change, ( - ) new changes  All other systems were reviewed with the patient and are negative.  PHYSICAL EXAMINATION:  Vitals:   11/02/22 1346  BP: 120/74  Pulse: 68  Resp: 13  Temp: 99.3 F (37.4 C)  SpO2: 100%   Filed Weights   11/02/22 1346  Weight: 267 lb 8 oz (121.3 kg)    GENERAL: well appearing middle-aged African-American female in NAD  SKIN: skin color, texture, turgor are normal, no rashes or significant lesions EYES: conjunctiva are pink and non-injected, sclera clear LUNGS: clear to auscultation and percussion with normal breathing effort HEART: regular rate & rhythm and no murmurs and no lower extremity edema Musculoskeletal: no cyanosis of digits and no clubbing  PSYCH: alert & oriented x 3, fluent speech NEURO: no focal motor/sensory deficits  LABORATORY DATA:  I have reviewed the data as listed    Latest Ref Rng & Units 11/02/2022    2:11 PM 10/08/2022    3:26 PM 02/14/2022    9:48 AM  CBC  WBC 4.0 - 10.5 K/uL 6.5  7.4  5.9   Hemoglobin 12.0 - 15.0 g/dL 57.8  46.9  62.9   Hematocrit 36.0 - 46.0 % 31.9  32.4  35.1   Platelets 150 - 400 K/uL 376  379.0  363.0        Latest Ref Rng & Units 10/08/2022    3:26 PM 02/14/2022    9:48 AM 12/27/2020    2:28 PM  CMP  Glucose 70 - 99 mg/dL 87  88  70   BUN 6 - 23 mg/dL 11  9  11    Creatinine 0.40 - 1.20 mg/dL 5.28  4.13  2.44   Sodium 135 - 145 mEq/L 137  138  136   Potassium 3.5 - 5.1 mEq/L 3.9  4.3  4.2   Chloride 96 - 112 mEq/L 103  104  100   CO2 19 - 32 mEq/L 28  27  27    Calcium 8.4 -  10.5 mg/dL 9.4  9.5  9.4   Total Protein 6.0 - 8.3 g/dL 7.2  7.1  6.9   Total Bilirubin 0.2 - 1.2 mg/dL 0.2  0.3  0.3   Alkaline Phos 39 - 117 U/L 50  51  49   AST 0 - 37 U/L 17  17  24    ALT 0 - 35 U/L 15  21  28       ASSESSMENT & PLAN Lateia Deluccia 32 y.o. female with medical history significant for asthma and obesity who presents for evaluation of iron  deficiency anemia.   After review of the labs, review of the records, and discussion with the patient the patients findings are most consistent with iron deficiency anemia 2/2 to GYN bleeding.   # Iron Deficiency Anemia 2/2 to GYN Bleeding -- Findings are consistent with iron deficiency anemia secondary to patient's menorrhagia --Encouraged her to follow-up with OB/GYN for better control of her menstrual cycles --We will confirm iron deficiency anemia by ordering iron panel and ferritin as well as reticulocytes, CBC, and CMP -- Patient trialed p.o. iron therapy without success. --We will plan to proceed with IV iron therapy in order to help bolster the patient's blood counts --Plan for return to clinic in 4 to 6 weeks time after last dose of IV iron   Orders Placed This Encounter  Procedures   CBC with Differential (Cancer Center Only)    Standing Status:   Future    Number of Occurrences:   1    Standing Expiration Date:   11/02/2023   CMP (Cancer Center only)    Standing Status:   Future    Number of Occurrences:   1    Standing Expiration Date:   11/02/2023   Ferritin    Standing Status:   Future    Number of Occurrences:   1    Standing Expiration Date:   11/02/2023   Iron and Iron Binding Capacity ([facility] only)    Standing Status:   Future    Number of Occurrences:   1    Standing Expiration Date:   11/02/2023   Retic Panel    Standing Status:   Future    Number of Occurrences:   1    Standing Expiration Date:   11/02/2023    All questions were answered. The patient knows to call the clinic with any problems, questions or concerns.  A total of more than 60 minutes were spent on this encounter with face-to-face time and non-face-to-face time, including preparing to see the patient, ordering tests and/or medications, counseling the patient and coordination of care as outlined above.   Ulysees Barns, MD Department of Hematology/Oncology [facility] at [facility] Phone: [phone] Pager: [phone] Email: [email or web address]  11/02/2022 2:44 PM

## 2022-11-08 ENCOUNTER — Telehealth: Payer: Self-pay | Admitting: *Deleted

## 2022-11-08 ENCOUNTER — Encounter (INDEPENDENT_AMBULATORY_CARE_PROVIDER_SITE_OTHER): Payer: Self-pay

## 2022-11-08 NOTE — Telephone Encounter (Signed)
Received call from pt inquiring about her lab results from 11/02/22. Advised that I would have Georga Kaufmann, PA review lab results and make recommendations as Dr. Leonides Schanz is not in the office this week. Pt voiced understanding.

## 2022-11-13 ENCOUNTER — Telehealth: Payer: Self-pay | Admitting: Pharmacy Technician

## 2022-11-13 NOTE — Telephone Encounter (Signed)
Dr. Leonides Schanz, Monoferric in non preferred and will be denied if patient has not failed preferred medication. Preferred medication is Venofer. Would you like to try Venofer?  Auth Submission: NO AUTH NEEDED Site of care: Site of care: [facility] Payer: AETAN Medication & CPT/J Code(s) submitted: Venofer (Iron Sucrose) J1756 Route of submission (phone, fax, portal):  Phone # Fax # Auth type: Buy/Bill PB Units/visits requested:  Reference number:  Approval from:  to

## 2022-11-14 ENCOUNTER — Other Ambulatory Visit: Payer: Self-pay | Admitting: Pharmacy Technician

## 2022-11-15 NOTE — Addendum Note (Signed)
Addended by: Desma Mcgregor on: 11/15/2022 03:12 PM   Modules accepted: Orders

## 2022-11-21 ENCOUNTER — Ambulatory Visit (INDEPENDENT_AMBULATORY_CARE_PROVIDER_SITE_OTHER): Payer: 59

## 2022-11-21 VITALS — BP 116/90 | HR 68 | Temp 98.7°F | Resp 16 | Ht 63.0 in | Wt 263.4 lb

## 2022-11-21 DIAGNOSIS — D500 Iron deficiency anemia secondary to blood loss (chronic): Secondary | ICD-10-CM

## 2022-11-21 DIAGNOSIS — N920 Excessive and frequent menstruation with regular cycle: Secondary | ICD-10-CM | POA: Diagnosis not present

## 2022-11-21 MED ORDER — ACETAMINOPHEN 325 MG PO TABS
650.0000 mg | ORAL_TABLET | Freq: Once | ORAL | Status: AC
  Administered 2022-11-21: 650 mg via ORAL
  Filled 2022-11-21: qty 2

## 2022-11-21 MED ORDER — SODIUM CHLORIDE 0.9 % IV SOLN
200.0000 mg | Freq: Once | INTRAVENOUS | Status: AC
  Administered 2022-11-21: 200 mg via INTRAVENOUS
  Filled 2022-11-21: qty 10

## 2022-11-21 NOTE — Progress Notes (Signed)
Diagnosis: Iron Deficiency Anemia  Provider:  Chilton Greathouse MD  Procedure: IV Infusion  IV Type: Peripheral, IV Location: L Forearm  Venofer (Iron Sucrose), Dose: 200 mg  Infusion Start Time: 1344  Infusion Stop Time: 1402  Post Infusion IV Care: Observation period completed and Peripheral IV Discontinued  Discharge: Condition: Good, Destination: Home . AVS Provided  Performed by:  Garnette Czech, RN

## 2022-11-21 NOTE — Patient Instructions (Signed)
 Iron Sucrose Injection What is this medication? IRON SUCROSE (EYE ern SOO krose) treats low levels of iron (iron deficiency anemia) in people with kidney disease. Iron is a mineral that plays an important role in making red blood cells, which carry oxygen from your lungs to the rest of your body. This medicine may be used for other purposes; ask your health care provider or pharmacist if you have questions. COMMON BRAND NAME(S): Venofer What should I tell my care team before I take this medication? They need to know if you have any of these conditions: Anemia not caused by low iron levels Heart disease High levels of iron in the blood Kidney disease Liver disease An unusual or allergic reaction to iron, other medications, foods, dyes, or preservatives Pregnant or trying to get pregnant Breastfeeding How should I use this medication? This medication is for infusion into a vein. It is given in a hospital or clinic setting. Talk to your care team about the use of this medication in children. While this medication may be prescribed for children as young as 2 years for selected conditions, precautions do apply. Overdosage: If you think you have taken too much of this medicine contact a poison control center or emergency room at once. NOTE: This medicine is only for you. Do not share this medicine with others. What if I miss a dose? Keep appointments for follow-up doses. It is important not to miss your dose. Call your care team if you are unable to keep an appointment. What may interact with this medication? Do not take this medication with any of the following: Deferoxamine Dimercaprol Other iron products This medication may also interact with the following: Chloramphenicol Deferasirox This list may not describe all possible interactions. Give your health care provider a list of all the medicines, herbs, non-prescription drugs, or dietary supplements you use. Also tell them if you smoke,  drink alcohol, or use illegal drugs. Some items may interact with your medicine. What should I watch for while using this medication? Visit your care team regularly. Tell your care team if your symptoms do not start to get better or if they get worse. You may need blood work done while you are taking this medication. You may need to follow a special diet. Talk to your care team. Foods that contain iron include: whole grains/cereals, dried fruits, beans, or peas, leafy green vegetables, and organ meats (liver, kidney). What side effects may I notice from receiving this medication? Side effects that you should report to your care team as soon as possible: Allergic reactions--skin rash, itching, hives, swelling of the face, lips, tongue, or throat Low blood pressure--dizziness, feeling faint or lightheaded, blurry vision Shortness of breath Side effects that usually do not require medical attention (report to your care team if they continue or are bothersome): Flushing Headache Joint pain Muscle pain Nausea Pain, redness, or irritation at injection site This list may not describe all possible side effects. Call your doctor for medical advice about side effects. You may report side effects to FDA at 1-800-FDA-1088. Where should I keep my medication? This medication is given in a hospital or clinic. It will not be stored at home. NOTE: This sheet is a summary. It may not cover all possible information. If you have questions about this medicine, talk to your doctor, pharmacist, or health care provider.  2024 Elsevier/Gold Standard (2022-08-17 00:00:00)

## 2022-11-27 ENCOUNTER — Other Ambulatory Visit: Payer: Self-pay

## 2022-11-28 ENCOUNTER — Ambulatory Visit (INDEPENDENT_AMBULATORY_CARE_PROVIDER_SITE_OTHER): Payer: 59

## 2022-11-28 ENCOUNTER — Encounter: Payer: Self-pay | Admitting: Primary Care

## 2022-11-28 ENCOUNTER — Ambulatory Visit (INDEPENDENT_AMBULATORY_CARE_PROVIDER_SITE_OTHER): Payer: 59 | Admitting: Primary Care

## 2022-11-28 VITALS — BP 116/74 | HR 72 | Temp 97.3°F | Ht 63.0 in | Wt 265.0 lb

## 2022-11-28 VITALS — BP 129/88 | HR 61 | Temp 98.4°F | Resp 18 | Ht 63.0 in | Wt 265.2 lb

## 2022-11-28 DIAGNOSIS — Z23 Encounter for immunization: Secondary | ICD-10-CM | POA: Diagnosis not present

## 2022-11-28 DIAGNOSIS — D500 Iron deficiency anemia secondary to blood loss (chronic): Secondary | ICD-10-CM

## 2022-11-28 DIAGNOSIS — N920 Excessive and frequent menstruation with regular cycle: Secondary | ICD-10-CM

## 2022-11-28 DIAGNOSIS — L918 Other hypertrophic disorders of the skin: Secondary | ICD-10-CM | POA: Diagnosis not present

## 2022-11-28 MED ORDER — ACETAMINOPHEN 325 MG PO TABS
650.0000 mg | ORAL_TABLET | Freq: Once | ORAL | Status: AC
  Administered 2022-11-28: 650 mg via ORAL
  Filled 2022-11-28: qty 2

## 2022-11-28 MED ORDER — SODIUM CHLORIDE 0.9 % IV SOLN
200.0000 mg | Freq: Once | INTRAVENOUS | Status: AC
  Administered 2022-11-28: 200 mg via INTRAVENOUS
  Filled 2022-11-28: qty 10

## 2022-11-28 MED ORDER — LORATADINE 10 MG PO TABS: 10.0000 mg | ORAL_TABLET | Freq: Once | ORAL | Status: DC | PRN

## 2022-11-28 NOTE — Patient Instructions (Signed)
Keep the site clean and dry. Use a bandage for today.  It was a pleasure to see you today!

## 2022-11-28 NOTE — Assessment & Plan Note (Addendum)
Uncomplicated.   Patient desires removal. Patient provided written consent for procedure. Site cleansed with chlorhexidine solution. Lidocaine 2% with epi was used for analgesia per patient request. Skin tag was removed without complications. Silver nitrate sticks used for bleeding control. Pressure dressing applied.  Bleeding controlled.  Offered to send skin tag to pathology, patient kindly declines.  Patient tolerated well. Discussed home care treatment.

## 2022-11-28 NOTE — Progress Notes (Signed)
Subjective:    Patient ID: Felicia Salinas, female    DOB: 1991/03/06, 32 y.o.   MRN: [id number]  HPI  Felicia Salinas is a very pleasant 32 y.o. female with a history of hemorrhoids, migraines, asthma, OSA, PCOS, obesity, prediabetes who presents today for skin tag removal.  Her skin tag is located to the left groin which has been present for "years". She would like the skin tag removed because it snags her clothing. She denies pain, changes in size and shape, rashes, erythema, warmth.     Review of Systems  Constitutional:  Negative for fever.  Skin:  Negative for color change and rash.       Skin tag to left groin         Past Medical History:  Diagnosis Date   Angio-edema    Anxiety    Asthma    controlled per patient last attack in January   Environmental allergies    Obesity    Urticaria     Social History   Socioeconomic History   Marital status: Married    Spouse name: Not on file   Number of children: 0   Years of education: Not on file   Highest education level: Associate degree: academic program  Occupational History   Occupation: Cabin crew: [employer]  Tobacco Use   Smoking status: Never   Smokeless tobacco: Never  Vaping Use   Vaping status: Never Used  Substance and Sexual Activity   Alcohol use: Not Currently    Comment: Occasional   Drug use: No   Sexual activity: Yes    Birth control/protection: None  Other Topics Concern   Not on file  Social History Narrative   Lives at home with husband and cat   Left handed   Caffeine: frappe occasionally    Social Determinants of Health   Financial Resource Strain: High Risk (10/21/2022)   Overall Financial Resource Strain (CARDIA)    Difficulty of Paying Living Expenses: Hard  Food Insecurity: Food Insecurity Present (10/21/2022)   Hunger Vital Sign    Worried About Running Out of Food in the Last Year: Often true    Ran Out of Food in the Last Year: Often true  Transportation Needs:  No Transportation Needs (11/02/2022)   PRAPARE - Administrator, Civil Service (Medical): No    Lack of Transportation (Non-Medical): No  Physical Activity: Unknown (10/21/2022)   Exercise Vital Sign    Days of Exercise per Week: Patient declined    Minutes of Exercise per Session: Not on file  Stress: Patient Declined (10/21/2022)   [employer] of Occupational Health - Occupational Stress Questionnaire    Feeling of Stress : Patient declined  Social Connections: Moderately Integrated (10/21/2022)   Social Connection and Isolation Panel [NHANES]    Frequency of Communication with Friends and Family: More than three times a week    Frequency of Social Gatherings with Friends and Family: Twice a week    Attends Religious Services: More than 4 times per year    Active Member of [employer] or Organizations: No    Attends Banker Meetings: Not on file    Marital Status: Married  Catering manager Violence: Not At Risk (11/02/2022)   Humiliation, Afraid, Rape, and Kick questionnaire    Fear of Current or Ex-Partner: No    Emotionally Abused: No    Physically Abused: No    Sexually Abused: No    Past Surgical  History:  Procedure Laterality Date   APPENDECTOMY  04/07/2011   CHOLECYSTECTOMY  10/18/2011   Procedure: LAPAROSCOPIC CHOLECYSTECTOMY WITH INTRAOPERATIVE CHOLANGIOGRAM;  Surgeon: Emelia Loron, MD;  Location: [facility] OR;  Service: General;  Laterality: N/A;   IR ANGIO INTRA EXTRACRAN SEL COM CAROTID INNOMINATE UNI R MOD SED  06/02/2019   IR ANGIO INTRA EXTRACRAN SEL INTERNAL CAROTID UNI L MOD SED  06/02/2019   IR ANGIO VERTEBRAL SEL VERTEBRAL UNI R MOD SED  06/02/2019   IR US GUIDE VASC ACCESS RIGHT  06/02/2019   TONSILLECTOMY     WISDOM TOOTH EXTRACTION      Family History  Problem Relation Age of Onset   Breast cancer Mother        mets to spine   Asthma Mother    Asthma Father    Diabetes type II Sister    Breast cancer Maternal Grandmother    Diabetes Maternal  Grandmother    Rheum arthritis Maternal Grandmother    Hypertension Maternal Grandfather    Diabetes Paternal Grandfather    Ovarian cancer Maternal Aunt    Ovarian cancer Paternal Aunt    Colon cancer Neg Hx     Allergies  Allergen Reactions   Dairy Aid [Tilactase] Anaphylaxis   Other Hives and Anaphylaxis     shrimp, dairy products, shellfish (Swelling) Other reaction(s): Hives   Shellfish Allergy Anaphylaxis   Benadryl [Diphenhydramine Hcl] Hives   Egg-Derived Products Hives and Rash   Iodine Hives and Rash   Tape Rash    Current Outpatient Medications on File Prior to Visit  Medication Sig Dispense Refill   busPIRone (BUSPAR) 5 MG tablet Take 1 tablet (5 mg total) by mouth 2 (two) times daily for anxiety. 180 tablet 0   norethindrone-ethinyl estradiol-FE (JUNEL FE 1/20) 1-20 MG-MCG tablet Take 1 tablet by mouth daily. 84 tablet 0   propranolol ER (INDERAL LA) 80 MG 24 hr capsule Take 1 capsule (80 mg) by mouth at bedtime. For headache prevention 90 capsule 0   sertraline (ZOLOFT) 100 MG tablet Take 1 tablet (100 mg) by mouth daily for anxiety and depression. 90 tablet 0   SUMAtriptan (IMITREX) 50 MG tablet Take 1 tablet by mouth at migraine onset. May repeat in 2 hours if headache persists or recurs. 10 tablet 0   No current facility-administered medications on file prior to visit.    BP 116/74   Pulse 72   Temp (!) 97.3 F (36.3 C) (Temporal)   Ht 5\' 3"  (1.6 m)   Wt 265 lb (120.2 kg)   LMP 11/13/2022 (Exact Date)   SpO2 100%   BMI 46.94 kg/m  Objective:   Physical Exam Cardiovascular:     Rate and Rhythm: Normal rate.  Pulmonary:     Effort: Pulmonary effort is normal.  Skin:    General: Skin is warm and dry.     Findings: No erythema.     Comments: 1 cm raised skin tag to the left inguinal space. No surrounding erythema or irritation.   Neurological:     Mental Status: She is alert.           Assessment & Plan:  Skin tag Assessment &  Plan: Uncomplicated.   Patient desires removal. Patient provided written consent for procedure. Site cleansed with chlorhexidine solution. Lidocaine 2% with epi was used for analgesia per patient request. Skin tag was removed without complications. Silver nitrate sticks used for bleeding control. Pressure dressing applied.  Bleeding controlled.  Offered to  send skin tag to pathology, patient kindly declines.  Patient tolerated well. Discussed home care treatment.   Encounter for immunization -     Influenza, MDCK, trivalent, PF(Flucelvax egg-free)        Doreene Nest, NP

## 2022-11-28 NOTE — Progress Notes (Signed)
Diagnosis: Iron Deficiency Anemia  Provider:  Chilton Greathouse MD  Procedure: IV Infusion  IV Type: Peripheral, IV Location: L Hand  Venofer (Iron Sucrose), Dose: 200 mg  Infusion Start Time: 1339  Infusion Stop Time: 1430  Post Infusion IV Care: Patient declined observation and Peripheral IV Discontinued  Discharge: Condition: Good, Destination: Home . AVS Declined  Performed by:  Rico Ala, LPN

## 2022-12-05 ENCOUNTER — Ambulatory Visit (INDEPENDENT_AMBULATORY_CARE_PROVIDER_SITE_OTHER): Payer: 59

## 2022-12-05 VITALS — BP 119/80 | HR 61 | Temp 98.4°F | Resp 18 | Ht 64.0 in | Wt 268.8 lb

## 2022-12-05 DIAGNOSIS — D500 Iron deficiency anemia secondary to blood loss (chronic): Secondary | ICD-10-CM

## 2022-12-05 DIAGNOSIS — N920 Excessive and frequent menstruation with regular cycle: Secondary | ICD-10-CM | POA: Diagnosis not present

## 2022-12-05 MED ORDER — ACETAMINOPHEN 325 MG PO TABS
650.0000 mg | ORAL_TABLET | Freq: Once | ORAL | Status: AC
  Administered 2022-12-05: 650 mg via ORAL
  Filled 2022-12-05: qty 2

## 2022-12-05 MED ORDER — SODIUM CHLORIDE 0.9 % IV SOLN
200.0000 mg | Freq: Once | INTRAVENOUS | Status: AC
  Administered 2022-12-05: 200 mg via INTRAVENOUS
  Filled 2022-12-05: qty 10

## 2022-12-05 NOTE — Progress Notes (Signed)
Diagnosis: Iron Deficiency Anemia  Provider:  Chilton Greathouse MD  Procedure: IV Infusion  IV Type: Peripheral, IV Location: R Hand  Venofer (Iron Sucrose), Dose: 200 mg  Infusion Start Time: 1336  Infusion Stop Time: 1359  Post Infusion IV Care: Patient declined observation and Peripheral IV Discontinued  Discharge: Condition: Good, Destination: Home . AVS Declined  Performed by:  Garnette Czech, RN

## 2022-12-06 ENCOUNTER — Other Ambulatory Visit (HOSPITAL_COMMUNITY): Payer: Self-pay

## 2022-12-12 ENCOUNTER — Ambulatory Visit (INDEPENDENT_AMBULATORY_CARE_PROVIDER_SITE_OTHER): Payer: 59

## 2022-12-12 VITALS — BP 132/85 | HR 60 | Temp 98.9°F | Resp 17 | Ht 63.0 in | Wt 269.4 lb

## 2022-12-12 DIAGNOSIS — N920 Excessive and frequent menstruation with regular cycle: Secondary | ICD-10-CM | POA: Diagnosis not present

## 2022-12-12 DIAGNOSIS — D500 Iron deficiency anemia secondary to blood loss (chronic): Secondary | ICD-10-CM | POA: Diagnosis not present

## 2022-12-12 MED ORDER — SODIUM CHLORIDE 0.9 % IV SOLN
200.0000 mg | Freq: Once | INTRAVENOUS | Status: AC
  Administered 2022-12-12: 200 mg via INTRAVENOUS
  Filled 2022-12-12: qty 10

## 2022-12-12 MED ORDER — ACETAMINOPHEN 325 MG PO TABS
650.0000 mg | ORAL_TABLET | Freq: Once | ORAL | Status: AC
  Administered 2022-12-12: 650 mg via ORAL
  Filled 2022-12-12: qty 2

## 2022-12-12 NOTE — Progress Notes (Signed)
Diagnosis: Iron Deficiency Anemia  Provider:  Chilton Greathouse MD  Procedure: IV Infusion  IV Type: Peripheral, IV Location: R Forearm  Venofer (Iron Sucrose), Dose: 200 mg  Infusion Start Time: 1334  Infusion Stop Time: 1350  Post Infusion IV Care: Patient declined observation and Peripheral IV Discontinued  Discharge: Condition: Good, Destination: Home . AVS Declined  Performed by:  Garnette Czech, RN

## 2022-12-19 ENCOUNTER — Ambulatory Visit (INDEPENDENT_AMBULATORY_CARE_PROVIDER_SITE_OTHER): Payer: 59

## 2022-12-19 VITALS — BP 122/91 | HR 58 | Temp 98.4°F | Resp 16 | Ht 64.0 in | Wt 268.4 lb

## 2022-12-19 DIAGNOSIS — N920 Excessive and frequent menstruation with regular cycle: Secondary | ICD-10-CM | POA: Diagnosis not present

## 2022-12-19 DIAGNOSIS — D500 Iron deficiency anemia secondary to blood loss (chronic): Secondary | ICD-10-CM

## 2022-12-19 MED ORDER — ACETAMINOPHEN 325 MG PO TABS
650.0000 mg | ORAL_TABLET | Freq: Once | ORAL | Status: AC
  Administered 2022-12-19: 650 mg via ORAL
  Filled 2022-12-19: qty 2

## 2022-12-19 MED ORDER — SODIUM CHLORIDE 0.9 % IV SOLN
200.0000 mg | Freq: Once | INTRAVENOUS | Status: AC
  Administered 2022-12-19: 200 mg via INTRAVENOUS
  Filled 2022-12-19: qty 10

## 2022-12-19 NOTE — Progress Notes (Signed)
Diagnosis: Iron Deficiency Anemia  Provider:  Chilton Greathouse MD  Procedure: IV Infusion  IV Type: Peripheral, IV Location: R Forearm  Venofer (Iron Sucrose), Dose: 200 mg  Infusion Start Time: 1337  Infusion Stop Time: 1355  Post Infusion IV Care: Observation period completed and Peripheral IV Discontinued  Discharge: Condition: Good, Destination: Home . AVS Declined  Performed by:  Garnette Czech, RN

## 2022-12-25 ENCOUNTER — Other Ambulatory Visit: Payer: Self-pay

## 2022-12-25 ENCOUNTER — Other Ambulatory Visit (HOSPITAL_COMMUNITY): Payer: Self-pay

## 2022-12-25 ENCOUNTER — Other Ambulatory Visit: Payer: Self-pay | Admitting: Primary Care

## 2022-12-25 DIAGNOSIS — F419 Anxiety disorder, unspecified: Secondary | ICD-10-CM

## 2022-12-25 DIAGNOSIS — G43711 Chronic migraine without aura, intractable, with status migrainosus: Secondary | ICD-10-CM

## 2022-12-25 MED ORDER — SUMATRIPTAN SUCCINATE 50 MG PO TABS
ORAL_TABLET | ORAL | 0 refills | Status: DC
  Filled 2022-12-25: qty 10, 30d supply, fill #0

## 2022-12-25 MED ORDER — SERTRALINE HCL 100 MG PO TABS
100.0000 mg | ORAL_TABLET | Freq: Every day | ORAL | 2 refills | Status: AC
  Filled 2022-12-25: qty 30, 30d supply, fill #0

## 2022-12-26 ENCOUNTER — Ambulatory Visit: Payer: 59 | Admitting: Primary Care

## 2023-01-03 ENCOUNTER — Other Ambulatory Visit: Payer: Self-pay | Admitting: Primary Care

## 2023-01-03 ENCOUNTER — Other Ambulatory Visit (HOSPITAL_COMMUNITY): Payer: Self-pay

## 2023-01-03 DIAGNOSIS — N920 Excessive and frequent menstruation with regular cycle: Secondary | ICD-10-CM

## 2023-01-03 MED ORDER — NORETHIN ACE-ETH ESTRAD-FE 1-20 MG-MCG PO TABS
1.0000 | ORAL_TABLET | Freq: Every day | ORAL | 2 refills | Status: DC
Start: 2023-01-03 — End: 2023-05-02
  Filled 2023-01-03: qty 28, 28d supply, fill #0

## 2023-01-04 ENCOUNTER — Other Ambulatory Visit (HOSPITAL_COMMUNITY): Payer: Self-pay

## 2023-01-07 ENCOUNTER — Other Ambulatory Visit (HOSPITAL_COMMUNITY): Payer: Self-pay

## 2023-01-17 ENCOUNTER — Inpatient Hospital Stay: Payer: 59 | Attending: Hematology and Oncology

## 2023-01-17 ENCOUNTER — Inpatient Hospital Stay (HOSPITAL_BASED_OUTPATIENT_CLINIC_OR_DEPARTMENT_OTHER): Payer: 59 | Admitting: Hematology and Oncology

## 2023-01-17 ENCOUNTER — Other Ambulatory Visit: Payer: Self-pay

## 2023-01-17 ENCOUNTER — Other Ambulatory Visit: Payer: Self-pay | Admitting: Hematology and Oncology

## 2023-01-17 VITALS — BP 124/76 | HR 82 | Temp 97.9°F | Resp 16 | Wt 271.3 lb

## 2023-01-17 DIAGNOSIS — D500 Iron deficiency anemia secondary to blood loss (chronic): Secondary | ICD-10-CM

## 2023-01-17 DIAGNOSIS — N920 Excessive and frequent menstruation with regular cycle: Secondary | ICD-10-CM | POA: Insufficient documentation

## 2023-01-17 LAB — CBC WITH DIFFERENTIAL (CANCER CENTER ONLY)
Abs Immature Granulocytes: 0.03 10*3/uL (ref 0.00–0.07)
Basophils Absolute: 0 10*3/uL (ref 0.0–0.1)
Basophils Relative: 1 %
Eosinophils Absolute: 0.2 10*3/uL (ref 0.0–0.5)
Eosinophils Relative: 3 %
HCT: 38.9 % (ref 36.0–46.0)
Hemoglobin: 13.2 g/dL (ref 12.0–15.0)
Immature Granulocytes: 0 %
Lymphocytes Relative: 30 %
Lymphs Abs: 2.3 10*3/uL (ref 0.7–4.0)
MCH: 31.4 pg (ref 26.0–34.0)
MCHC: 33.9 g/dL (ref 30.0–36.0)
MCV: 92.4 fL (ref 80.0–100.0)
Monocytes Absolute: 0.5 10*3/uL (ref 0.1–1.0)
Monocytes Relative: 7 %
Neutro Abs: 4.4 10*3/uL (ref 1.7–7.7)
Neutrophils Relative %: 59 %
Platelet Count: 360 10*3/uL (ref 150–400)
RBC: 4.21 MIL/uL (ref 3.87–5.11)
RDW: 16.3 % — ABNORMAL HIGH (ref 11.5–15.5)
WBC Count: 7.5 10*3/uL (ref 4.0–10.5)
nRBC: 0 % (ref 0.0–0.2)

## 2023-01-17 LAB — RETIC PANEL
Immature Retic Fract: 14.3 % (ref 2.3–15.9)
RBC.: 4.31 MIL/uL (ref 3.87–5.11)
Retic Count, Absolute: 91.8 10*3/uL (ref 19.0–186.0)
Retic Ct Pct: 2.1 % (ref 0.4–3.1)
Reticulocyte Hemoglobin: 34.4 pg (ref >27.9)

## 2023-01-17 LAB — IRON AND IRON BINDING CAPACITY (CC-WL,HP ONLY)
Iron: 52 ug/dL (ref 28–170)
Saturation Ratios: 14 % (ref 10.4–31.8)
TIBC: 364 ug/dL (ref 250–450)
UIBC: 312 ug/dL

## 2023-01-17 LAB — CMP (CANCER CENTER ONLY)
ALT: 20 U/L (ref 0–44)
AST: 16 U/L (ref 15–41)
Albumin: 3.9 g/dL (ref 3.5–5.0)
Alkaline Phosphatase: 45 U/L (ref 38–126)
Anion gap: 8 (ref 5–15)
BUN: 10 mg/dL (ref 6–20)
CO2: 29 mmol/L (ref 22–32)
Calcium: 9.2 mg/dL (ref 8.9–10.3)
Chloride: 102 mmol/L (ref 98–111)
Creatinine: 0.81 mg/dL (ref 0.44–1.00)
GFR, Estimated: >60 mL/min (ref >60)
Glucose, Bld: 100 mg/dL — ABNORMAL HIGH (ref 70–99)
Potassium: 3.8 mmol/L (ref 3.5–5.1)
Sodium: 139 mmol/L (ref 135–145)
Total Bilirubin: 0.4 mg/dL (ref 0.3–1.2)
Total Protein: 7.3 g/dL (ref 6.5–8.1)

## 2023-01-17 LAB — FERRITIN: Ferritin: 61 ng/mL (ref 11–307)

## 2023-01-17 NOTE — Progress Notes (Signed)
[facility] [phone]   [phone]  PROGRESS NOTE  Patient Care Team: Doreene Nest, NP as PCP - General (Internal Medicine) Marcelle Overlie, MD as Consulting Physician (Obstetrics and Gynecology)  Hematological/Oncological History # Iron Deficiency Anemia 2/2 to GYN Bleeding  10/08/2022: WBC 7.4. Hgb 10.4, MCV 87.4, Plt 379. Ferritin 5.7, Sat 3.8% 11/02/2022: establish care with Dr. Leonides Schanz  11/21/2022-12/19/2022: 5 doses of IV venofer 200 mg   Interval History:  Felicia Salinas 32 y.o. female with medical history significant for IDA who presents for a follow up visit. The patient's last visit was on 11/02/2022. In the interim since the last visit she received 5 doses of IV venofer.   On exam today Felicia Salinas reports that she tolerated her 5 iron infusions well.  She reports after the infusion she felt "drained".  She reports that after she would leave appointment she would spend the rest of the day in bed.  She reports that she maybe had a little spurt of energy but otherwise feels the same.  She notes that her last menstrual cycle was on 10 October and was not particular heavy.  She has continued to try to eat an iron rich diet including broccoli as well as beef/steak.  She reports that she has had some occasional lightheadedness but no dizziness or shortness of breath.  She reports her energy is about a 4 out of 10 today.  She is not having any bleeding elsewhere such as nosebleeds, gum bleeding, or dark stools.  She denies any fevers, chills, sweats.  A full 10 point ROS is otherwise negative.  MEDICAL HISTORY:  Past Medical History:  Diagnosis Date   Angio-edema    Anxiety    Asthma    controlled per patient last attack in January   Environmental allergies    Obesity    Urticaria     SURGICAL HISTORY: Past Surgical History:  Procedure Laterality Date   APPENDECTOMY  04/07/2011   CHOLECYSTECTOMY  10/18/2011   Procedure: LAPAROSCOPIC  CHOLECYSTECTOMY WITH INTRAOPERATIVE CHOLANGIOGRAM;  Surgeon: Emelia Loron, MD;  Location: [facility] OR;  Service: General;  Laterality: N/A;   IR ANGIO INTRA EXTRACRAN SEL COM CAROTID INNOMINATE UNI R MOD SED  06/02/2019   IR ANGIO INTRA EXTRACRAN SEL INTERNAL CAROTID UNI L MOD SED  06/02/2019   IR ANGIO VERTEBRAL SEL VERTEBRAL UNI R MOD SED  06/02/2019   IR US GUIDE VASC ACCESS RIGHT  06/02/2019   TONSILLECTOMY     WISDOM TOOTH EXTRACTION      SOCIAL HISTORY: Social History   Socioeconomic History   Marital status: Married    Spouse name: Not on file   Number of children: 0   Years of education: Not on file   Highest education level: Associate degree: academic program  Occupational History   Occupation: Cabin crew: [employer]  Tobacco Use   Smoking status: Never   Smokeless tobacco: Never  Vaping Use   Vaping status: Never Used  Substance and Sexual Activity   Alcohol use: Not Currently    Comment: Occasional   Drug use: No   Sexual activity: Yes    Birth control/protection: None  Other Topics Concern   Not on file  Social History Narrative   Lives at home with husband and cat   Left handed   Caffeine: frappe occasionally    Social Determinants of Health   Financial Resource Strain: High Risk (10/21/2022)   Overall Financial Resource Strain (CARDIA)  Difficulty of Paying Living Expenses: Hard  Food Insecurity: Food Insecurity Present (10/21/2022)   Hunger Vital Sign    Worried About Running Out of Food in the Last Year: Often true    Ran Out of Food in the Last Year: Often true  Transportation Needs: No Transportation Needs (11/02/2022)   PRAPARE - Administrator, Civil Service (Medical): No    Lack of Transportation (Non-Medical): No  Physical Activity: Unknown (10/21/2022)   Exercise Vital Sign    Days of Exercise per Week: Patient declined    Minutes of Exercise per Session: Not on file  Stress: Patient Declined (10/21/2022)   [employer] of  Occupational Health - Occupational Stress Questionnaire    Feeling of Stress : Patient declined  Social Connections: Moderately Integrated (10/21/2022)   Social Connection and Isolation Panel [NHANES]    Frequency of Communication with Friends and Family: More than three times a week    Frequency of Social Gatherings with Friends and Family: Twice a week    Attends Religious Services: More than 4 times per year    Active Member of [employer] or Organizations: No    Attends Engineer, structural: Not on file    Marital Status: Married  Catering manager Violence: Not At Risk (11/02/2022)   Humiliation, Afraid, Rape, and Kick questionnaire    Fear of Current or Ex-Partner: No    Emotionally Abused: No    Physically Abused: No    Sexually Abused: No    FAMILY HISTORY: Family History  Problem Relation Age of Onset   Breast cancer Mother        mets to spine   Asthma Mother    Asthma Father    Diabetes type II Sister    Breast cancer Maternal Grandmother    Diabetes Maternal Grandmother    Rheum arthritis Maternal Grandmother    Hypertension Maternal Grandfather    Diabetes Paternal Grandfather    Ovarian cancer Maternal Aunt    Ovarian cancer Paternal Aunt    Colon cancer Neg Hx     ALLERGIES:  is allergic to dairy aid [tilactase], other, shellfish allergy, benadryl [diphenhydramine hcl], egg-derived products, iodine, and tape.  MEDICATIONS:  Current Outpatient Medications  Medication Sig Dispense Refill   busPIRone (BUSPAR) 5 MG tablet Take 1 tablet (5 mg total) by mouth 2 (two) times daily for anxiety. 180 tablet 0   norethindrone-ethinyl estradiol-FE (TARINA FE 1/20 EQ) 1-20 MG-MCG tablet Take 1 tablet by mouth daily. 84 tablet 2   propranolol ER (INDERAL LA) 80 MG 24 hr capsule Take 1 capsule (80 mg) by mouth at bedtime. For headache prevention 90 capsule 0   sertraline (ZOLOFT) 100 MG tablet Take 1 tablet (100 mg) by mouth daily for anxiety and depression. 90 tablet 2    SUMAtriptan (IMITREX) 50 MG tablet Take 1 tablet by mouth at migraine onset. May repeat in 2 hours if headache persists or recurs. 10 tablet 0   No current facility-administered medications for this visit.    REVIEW OF SYSTEMS:   Constitutional: ( - ) fevers, ( - )  chills , ( - ) night sweats Eyes: ( - ) blurriness of vision, ( - ) double vision, ( - ) watery eyes Ears, nose, mouth, throat, and face: ( - ) mucositis, ( - ) sore throat Respiratory: ( - ) cough, ( - ) dyspnea, ( - ) wheezes Cardiovascular: ( - ) palpitation, ( - ) chest discomfort, ( - )  lower extremity swelling Gastrointestinal:  ( - ) nausea, ( - ) heartburn, ( - ) change in bowel habits Skin: ( - ) abnormal skin rashes Lymphatics: ( - ) new lymphadenopathy, ( - ) easy bruising Neurological: ( - ) numbness, ( - ) tingling, ( - ) new weaknesses Behavioral/Psych: ( - ) mood change, ( - ) new changes  All other systems were reviewed with the patient and are negative.  PHYSICAL EXAMINATION: Vitals:   01/17/23 1121  BP: 124/76  Pulse: 82  Resp: 16  Temp: 97.9 F (36.6 C)  SpO2: 100%   Filed Weights   01/17/23 1121  Weight: 271 lb 4.8 oz (123.1 kg)    GENERAL: Well-appearing middle-aged African-American female, alert, no distress and comfortable SKIN: skin color, texture, turgor are normal, no rashes or significant lesions EYES: conjunctiva are pink and non-injected, sclera clear LUNGS: clear to auscultation and percussion with normal breathing effort HEART: regular rate & rhythm and no murmurs and no lower extremity edema Musculoskeletal: no cyanosis of digits and no clubbing  PSYCH: alert & oriented x 3, fluent speech NEURO: no focal motor/sensory deficits  LABORATORY DATA:  I have reviewed the data as listed    Latest Ref Rng & Units 01/17/2023   10:55 AM 11/02/2022    2:11 PM 10/08/2022    3:26 PM  CBC  WBC 4.0 - 10.5 K/uL 7.5  6.5  7.4   Hemoglobin 12.0 - 15.0 g/dL 91.5  05.6  97.9   Hematocrit 36.0 -  46.0 % 38.9  31.9  32.4   Platelets 150 - 400 K/uL 360  376  379.0        Latest Ref Rng & Units 01/17/2023   10:55 AM 11/02/2022    2:11 PM 10/08/2022    3:26 PM  CMP  Glucose 70 - 99 mg/dL 480  92  87   BUN 6 - 20 mg/dL 10  6  11    Creatinine 0.44 - 1.00 mg/dL 1.65  5.37  4.82   Sodium 135 - 145 mmol/L 139  139  137   Potassium 3.5 - 5.1 mmol/L 3.8  3.7  3.9   Chloride 98 - 111 mmol/L 102  104  103   CO2 22 - 32 mmol/L 29  28  28    Calcium 8.9 - 10.3 mg/dL 9.2  9.2  9.4   Total Protein 6.5 - 8.1 g/dL 7.3  7.2  7.2   Total Bilirubin 0.3 - 1.2 mg/dL 0.4  0.3  0.2   Alkaline Phos 38 - 126 U/L 45  43  50   AST 15 - 41 U/L 16  15  17    ALT 0 - 44 U/L 20  27  15     RADIOGRAPHIC STUDIES: No results found.  ASSESSMENT & PLAN Felicia Salinas 32 y.o. female with medical history significant for IDA who presents for a follow up visit.   After review of the labs, review of the records, and discussion with the patient the patients findings are most consistent with iron deficiency anemia 2/2 to GYN bleeding.    # Iron Deficiency Anemia 2/2 to GYN Bleeding -- Findings are consistent with iron deficiency anemia secondary to patient's menorrhagia --Encouraged her to follow-up with OB/GYN for better control of her menstrual cycles -- Patient trialed p.o. iron therapy without success. --We will plan to proceed with IV iron therapy in order to help bolster the patient's blood counts if her iron levels remain low. No indication  for repeat IV iron at this time.  --labs today show white blood cell 7.5, hemoglobin 13.2, MCV 92.4, and platelets of 360.  Iron saturation is at 14%, ferritin pending. --Plan for return to clinic in 6 months to re-evaluate.   No orders of the defined types were placed in this encounter.   All questions were answered. The patient knows to call the clinic with any problems, questions or concerns.  A total of more than 30 minutes were spent on this encounter with  face-to-face time and non-face-to-face time, including preparing to see the patient, ordering tests and/or medications, counseling the patient and coordination of care as outlined above.   Ulysees Barns, MD Department of Hematology/Oncology [facility] Phone: [phone] Pager: [phone] Email: [email or web address]  01/17/2023 1:38 PM

## 2023-02-13 ENCOUNTER — Ambulatory Visit: Payer: 59 | Admitting: Primary Care

## 2023-02-14 ENCOUNTER — Encounter: Payer: Self-pay | Admitting: Primary Care

## 2023-03-22 ENCOUNTER — Other Ambulatory Visit (HOSPITAL_COMMUNITY): Payer: Self-pay

## 2023-03-22 ENCOUNTER — Encounter: Payer: Self-pay | Admitting: Primary Care

## 2023-03-22 ENCOUNTER — Telehealth (INDEPENDENT_AMBULATORY_CARE_PROVIDER_SITE_OTHER): Payer: 59 | Admitting: Primary Care

## 2023-03-22 DIAGNOSIS — G43711 Chronic migraine without aura, intractable, with status migrainosus: Secondary | ICD-10-CM

## 2023-03-22 MED ORDER — SUMATRIPTAN SUCCINATE 50 MG PO TABS
ORAL_TABLET | ORAL | 0 refills | Status: DC
  Filled 2023-03-22 – 2023-04-15 (×2): qty 10, 30d supply, fill #0

## 2023-03-22 MED ORDER — PROPRANOLOL HCL ER 120 MG PO CP24
120.0000 mg | ORAL_CAPSULE | Freq: Every day | ORAL | 1 refills | Status: DC
  Filled 2023-03-22 – 2023-04-15 (×2): qty 30, 30d supply, fill #0

## 2023-03-22 NOTE — Assessment & Plan Note (Signed)
Deteriorated with increased stress.  Increase propranolol ER to 120 mg daily for headache prevention. Continue sumatriptan 50 mg PRN which is effective. Discussed to use sparingly.  She will update in a few weeks.

## 2023-03-22 NOTE — Patient Instructions (Signed)
We increased the dose of your propranolol ER to 120 mg daily for headache prevention. Take 1 capsule by mouth every evening.   Continue the sumatriptan 50 mg tablets as needed for bad migraines. Use these sparingly.   Please update me in a few weeks.  It was a pleasure to see you today!

## 2023-03-22 NOTE — Progress Notes (Signed)
Patient ID: Felicia Salinas, female    DOB: 01/27/1991, 32 y.o.   MRN: [id number]  Virtual visit completed through caregility, a video enabled telemedicine application. Due to national recommendations of social distancing due to COVID-19, a virtual visit is felt to be most appropriate for this patient at this time. Reviewed limitations, risks, security and privacy concerns of performing a virtual visit and the availability of in person appointments. I also reviewed that there may be a patient responsible charge related to this service. The patient agreed to proceed.   Patient location: hair dresser Provider location: Adult nurse at [facility], office Persons participating in this virtual visit: patient, provider   If any vitals were documented, they were collected by patient at home unless specified below.    Ht 5\' 4"  (1.626 m) Comment: per chart  Wt 271 lb (122.9 kg) Comment: per chart  BMI 46.52 kg/m    CC: Headaches Subjective:   HPI: Felicia Salinas is a 32 y.o. female with a history of chronic migraines, asthma, OSA, empty sella turica, prediabetes, anxiety and depression presenting on 03/22/2023 for Headache (Discuss headaches and medication refill for sumatriptan )  Currently managed on propranolol ER 80 mg daily for headache prevention and sumatriptan 50 mg PRN for migraine abortion.   Over the last 2 months she's noticed increased headaches and migraines. Headaches and migraines are occurring every 2-3 days. She is consistent with her propranolol ER 80 mg HS which was previously effective for headache prevention. She is now taking sumatriptan 25 mg every 6 hours due to ongoing headaches.   She's been under a lot of stress over the last few months given the passing of her mother. She believes this has triggered her headaches/migraines.       Relevant past medical, surgical, family and social history reviewed and updated as indicated. Interim medical history since our last visit  reviewed. Allergies and medications reviewed and updated. Outpatient Medications Prior to Visit  Medication Sig Dispense Refill   busPIRone (BUSPAR) 5 MG tablet Take 1 tablet (5 mg total) by mouth 2 (two) times daily for anxiety. 180 tablet 0   sertraline (ZOLOFT) 100 MG tablet Take 1 tablet (100 mg) by mouth daily for anxiety and depression. 90 tablet 2   propranolol ER (INDERAL LA) 80 MG 24 hr capsule Take 1 capsule (80 mg) by mouth at bedtime. For headache prevention 90 capsule 0   SUMAtriptan (IMITREX) 50 MG tablet Take 1 tablet by mouth at migraine onset. May repeat in 2 hours if headache persists or recurs. 10 tablet 0   norethindrone-ethinyl estradiol-FE (TARINA FE 1/20 EQ) 1-20 MG-MCG tablet Take 1 tablet by mouth daily. 84 tablet 2   No facility-administered medications prior to visit.     Per HPI unless specifically indicated in ROS section below Review of Systems  Eyes:  Positive for photophobia.  Respiratory:  Negative for shortness of breath.   Cardiovascular:  Negative for chest pain.  Gastrointestinal:  Positive for nausea.  Neurological:  Positive for headaches.   Objective:  Ht 5\' 4"  (1.626 m) Comment: per chart  Wt 271 lb (122.9 kg) Comment: per chart  BMI 46.52 kg/m   Wt Readings from Last 3 Encounters:  03/22/23 271 lb (122.9 kg)  01/17/23 271 lb 4.8 oz (123.1 kg)  12/19/22 268 lb 7.2 oz (121.8 kg)       Physical exam: General: Alert and oriented x 3, no distress, does not appear sickly  Pulmonary: Speaks in complete  sentences without increased work of breathing, no cough during visit.  Psychiatric: Normal mood, thought content, and behavior.     Results for orders placed or performed in visit on 01/17/23  Retic Panel   Collection Time: 01/17/23 10:55 AM  Result Value Ref Range   Retic Ct Pct 2.1 0.4 - 3.1 %   RBC. 4.31 3.87 - 5.11 MIL/uL   Retic Count, Absolute 91.8 19.0 - 186.0 K/uL   Immature Retic Fract 14.3 2.3 - 15.9 %   Reticulocyte  Hemoglobin 34.4 >27.9 pg  Iron and Iron Binding Capacity ([facility] only)   Collection Time: 01/17/23 10:55 AM  Result Value Ref Range   Iron 52 28 - 170 ug/dL   TIBC 086 578 - 469 ug/dL   Saturation Ratios 14 10.4 - 31.8 %   UIBC 312 ug/dL  CMP (Cancer Center only)   Collection Time: 01/17/23 10:55 AM  Result Value Ref Range   Sodium 139 135 - 145 mmol/L   Potassium 3.8 3.5 - 5.1 mmol/L   Chloride 102 98 - 111 mmol/L   CO2 29 22 - 32 mmol/L   Glucose, Bld 100 (H) 70 - 99 mg/dL   BUN 10 6 - 20 mg/dL   Creatinine 6.29 5.28 - 1.00 mg/dL   Calcium 9.2 8.9 - 41.3 mg/dL   Total Protein 7.3 6.5 - 8.1 g/dL   Albumin 3.9 3.5 - 5.0 g/dL   AST 16 15 - 41 U/L   ALT 20 0 - 44 U/L   Alkaline Phosphatase 45 38 - 126 U/L   Total Bilirubin 0.4 0.3 - 1.2 mg/dL   GFR, Estimated >24 >40 mL/min   Anion gap 8 5 - 15  CBC with Differential (Cancer Center Only)   Collection Time: 01/17/23 10:55 AM  Result Value Ref Range   WBC Count 7.5 4.0 - 10.5 K/uL   RBC 4.21 3.87 - 5.11 MIL/uL   Hemoglobin 13.2 12.0 - 15.0 g/dL   HCT 10.2 72.5 - 36.6 %   MCV 92.4 80.0 - 100.0 fL   MCH 31.4 26.0 - 34.0 pg   MCHC 33.9 30.0 - 36.0 g/dL   RDW 44.0 (H) 34.7 - 42.5 %   Platelet Count 360 150 - 400 K/uL   nRBC 0.0 0.0 - 0.2 %   Neutrophils Relative % 59 %   Neutro Abs 4.4 1.7 - 7.7 K/uL   Lymphocytes Relative 30 %   Lymphs Abs 2.3 0.7 - 4.0 K/uL   Monocytes Relative 7 %   Monocytes Absolute 0.5 0.1 - 1.0 K/uL   Eosinophils Relative 3 %   Eosinophils Absolute 0.2 0.0 - 0.5 K/uL   Basophils Relative 1 %   Basophils Absolute 0.0 0.0 - 0.1 K/uL   Immature Granulocytes 0 %   Abs Immature Granulocytes 0.03 0.00 - 0.07 K/uL  Ferritin   Collection Time: 01/17/23 10:56 AM  Result Value Ref Range   Ferritin 61 11 - 307 ng/mL   Assessment & Plan:   Problem List Items Addressed This Visit       Cardiovascular and Mediastinum   Chronic migraine without aura, with intractable migraine, so stated, with status  migrainosus   Deteriorated with increased stress.  Increase propranolol ER to 120 mg daily for headache prevention. Continue sumatriptan 50 mg PRN which is effective. Discussed to use sparingly.  She will update in a few weeks.      Relevant Medications   propranolol ER (INDERAL LA) 120 MG 24 hr capsule  SUMAtriptan (IMITREX) 50 MG tablet     Meds ordered this encounter  Medications   propranolol ER (INDERAL LA) 120 MG 24 hr capsule    Sig: Take 1 capsule (120 mg total) by mouth at bedtime. For headache prevention    Dispense:  90 capsule    Refill:  1    Supervising Provider:   BEDSOLE, AMY E [[id number]]   SUMAtriptan (IMITREX) 50 MG tablet    Sig: Take 1 tablet by mouth at migraine onset. May repeat in 2 hours if headache persists or recurs.    Dispense:  10 tablet    Refill:  0    Supervising Provider:   BEDSOLE, AMY E [[id number]]   No orders of the defined types were placed in this encounter.   I discussed the assessment and treatment plan with the patient. The patient was provided an opportunity to ask questions and all were answered. The patient agreed with the plan and demonstrated an understanding of the instructions. The patient was advised to call back or seek an in-person evaluation if the symptoms worsen or if the condition fails to improve as anticipated.  Follow up plan:  We increased the dose of your propranolol ER to 120 mg daily for headache prevention. Take 1 capsule by mouth every evening.   Continue the sumatriptan 50 mg tablets as needed for bad migraines. Use these sparingly.   Please update me in a few weeks.  It was a pleasure to see you today!   Doreene Nest, NP

## 2023-04-08 ENCOUNTER — Other Ambulatory Visit (HOSPITAL_COMMUNITY): Payer: Self-pay

## 2023-04-15 ENCOUNTER — Other Ambulatory Visit (HOSPITAL_COMMUNITY): Payer: Self-pay

## 2023-04-30 ENCOUNTER — Ambulatory Visit
Admission: RE | Admit: 2023-04-30 | Discharge: 2023-04-30 | Disposition: A | Discharge status: Home / Self Care | Payer: 59 | Source: Ambulatory Visit | Attending: Family Medicine | Admitting: Family Medicine

## 2023-04-30 ENCOUNTER — Other Ambulatory Visit: Payer: Self-pay

## 2023-04-30 VITALS — BP 132/91 | HR 79 | Temp 99.8°F | Resp 18 | Ht 64.0 in | Wt 272.0 lb

## 2023-04-30 DIAGNOSIS — B349 Viral infection, unspecified: Secondary | ICD-10-CM

## 2023-04-30 DIAGNOSIS — R051 Acute cough: Secondary | ICD-10-CM

## 2023-04-30 LAB — POCT INFLUENZA A/B
Influenza A, POC: NEGATIVE
Influenza B, POC: NEGATIVE

## 2023-04-30 NOTE — ED Triage Notes (Signed)
 Pt presents with complaints of worsening cough, sore throat, headache, hot flashes, and congestion x 2 days. Denies taking medications OTC for symptoms. Pt currently rates her overall pain an 8/10. Unsure of fevers at home, but I did have feelings of warmth.

## 2023-04-30 NOTE — Discharge Instructions (Signed)
 Supportive Care Medications  These are medications that may help with your symptoms. However, Please note that if your PCP has told you not to use certain products please follow his/her recommendations.  For example: - if you have high BP you should use something similar to Coricidin HPB, not Sudafed or antihistamines with a "D" such as Allegra D. The "D" means decongestant.  (Afrin is safe to use with HBP, but do not use longer than 3 days) -Higher Doses of NSAIDS (Ibuprofen, Aleve etc) with Kidney disease or poorly controlled BP.  Fever, Body aches, Headache  Ibuprofen 200 mg 2 tablets and Acetaminophen 2 tabs 4 times a day just before meals and at bedtime (every 6 hours)    Do not use NSAIDS if you are allergic to NSAIDS, if you have been given oral steroids-prednisone, methylprednisolone, dexamethasone until your steroids are finished, if you are pregnant or breast feeding, or if you have history of kidney or liver disease.   Sore Throat: Cepacol throat lozenges or spray  Cough Drops Warm salt water gargles  How to make saltwater rinses Use warm water, because warmth is more relieving to a sore throat than cold water. Warm water will also help the salt dissolve into the water more effectively. Use any type of salt you have available. Most saltwater rinse recipes call for 8 ounces of warm water and 1 teaspoon of salt. However, if your mouth is tender and the saltwater rinse stings, decrease the salt to a 1/2 teaspoon for the first 1 to 2 days. Bring water to a boil, then remove from heat, add salt, and stir. Let the saltwater cool to a warm temperature before rinsing with it. Once you have finished your rinse, discard leftover solution to avoid contamination.  Nasal Congestion: Afrin (Oxymetazoline) 1 squirt in each side of nose 2 times daily for 3 days only.  Oral decongestants like sudafed but only if you do not have high Blood Pressure if you have high Blood Pressure take CORICIDIN  HBP Antihistamines Nasal Saline/Neti Pot  Cough: Expectorants (guaifenesin) help thin mucus making it easier to get up. This should be used during the day. During the day coughing helps bring mucus up out of your lungs Suppressants (dextromethorphan) just for bedtime so you can sleep   Oral rehydration is important when you have been sweating more than usual and/or having nausea and vomiting. You can use over the counter rehydration supplements like Pedialyte sport, Gatorlyte, Electrolit, Liquid IV, or you can make your own at home. Recipe:  Mix 1 liter of clean or boiled water with 6 teaspoons (2 tablespoons) of sugar and 1/2 tsp of salt. Stir until both dissolve.  You can add sugar free flavoring (i.e. Crystal light) if desired.  Drink small sips frequently rather than large amounts at once to prevent nausea.

## 2023-04-30 NOTE — ED Provider Notes (Signed)
 [facility]    CSN: [id number] Arrival date & time: 04/30/23  1619      History   Chief Complaint Chief Complaint  Patient presents with   Cough    And throat hurts and headache - Entered by patient    HPI Felicia Salinas is a 33 y.o. female.    Cough Associated symptoms: chills, diaphoresis, headaches, rhinorrhea and sore throat   Associated symptoms: no chest pain, no ear pain and no shortness of breath   Sick for 2 days symptoms include cough, sore throat, headache, subjective fever, chills and sweats, congestion, diarrhea. Denies ear pain, chest pain, shortness of breath, abdominal pain, nausea, vomiting.  Denies recent travel.  Denies close contacts with illness.  She works from home.  No over-the-counter remedies tried.  Past Medical History:  Diagnosis Date   Angio-edema    Anxiety    Asthma    controlled per patient last attack in January   Environmental allergies    Obesity    Urticaria     Patient Active Problem List   Diagnosis Date Noted   Skin tag 11/28/2022   Iron  deficiency anemia due to chronic blood loss 11/02/2022   Preventative health care 02/14/2022   Prediabetes 09/01/2021   Hemorrhoids 04/18/2021   Class 3 severe obesity due to excess calories with body mass index (BMI) of 40.0 to 44.9 in adult (HCC) 12/27/2020   Anxiety and depression 12/27/2020   Other allergic rhinitis 07/04/2020   Lactose intolerance 07/04/2020   Adverse reaction to food, subsequent encounter 07/04/2020   Urticaria 07/04/2020   Hirsutism 05/06/2020   Infertility, female 05/06/2020   Empty sella turcica (HCC) 05/06/2020   Chronic migraine without aura, with intractable migraine, so stated, with status migrainosus 10/16/2019   OSA (obstructive sleep apnea) 10/16/2019   Insomnia 10/16/2019   Asthma 09/29/2018   Polycystic ovary syndrome 09/29/2018    Past Surgical History:  Procedure Laterality Date   APPENDECTOMY  04/07/2011   CHOLECYSTECTOMY  10/18/2011    Procedure: LAPAROSCOPIC CHOLECYSTECTOMY WITH INTRAOPERATIVE CHOLANGIOGRAM;  Surgeon: Donnice Bury, MD;  Location: [facility] OR;  Service: General;  Laterality: N/A;   IR ANGIO INTRA EXTRACRAN SEL COM CAROTID INNOMINATE UNI R MOD SED  06/02/2019   IR ANGIO INTRA EXTRACRAN SEL INTERNAL CAROTID UNI L MOD SED  06/02/2019   IR ANGIO VERTEBRAL SEL VERTEBRAL UNI R MOD SED  06/02/2019   IR US  GUIDE VASC ACCESS RIGHT  06/02/2019   TONSILLECTOMY     WISDOM TOOTH EXTRACTION      OB History   No obstetric history on file.      Home Medications    Prior to Admission medications   Medication Sig Start Date End Date Taking? Authorizing Provider  busPIRone  (BUSPAR ) 5 MG tablet Take 1 tablet (5 mg total) by mouth 2 (two) times daily for anxiety. 06/14/22   Clark, Katherine K, NP  norethindrone -ethinyl estradiol -FE (TARINA  FE 1/20 EQ) 1-20 MG-MCG tablet Take 1 tablet by mouth daily. 01/03/23   Gretta Comer POUR, NP  propranolol  ER (INDERAL  LA) 120 MG 24 hr capsule Take 1 capsule (120 mg total) by mouth at bedtime. For headache prevention 03/22/23   Clark, Katherine K, NP  sertraline  (ZOLOFT ) 100 MG tablet Take 1 tablet (100 mg) by mouth daily for anxiety and depression. 12/25/22   Clark, Katherine K, NP  SUMAtriptan  (IMITREX ) 50 MG tablet Take 1 tablet by mouth at migraine onset. May repeat in 2 hours if headache persists or recurs.  03/22/23   Gretta Comer POUR, NP    Family History Family History  Problem Relation Age of Onset   Breast cancer Mother        mets to spine   Asthma Mother    Asthma Father    Diabetes type II Sister    Breast cancer Maternal Grandmother    Diabetes Maternal Grandmother    Rheum arthritis Maternal Grandmother    Hypertension Maternal Grandfather    Diabetes Paternal Grandfather    Ovarian cancer Maternal Aunt    Ovarian cancer Paternal Aunt    Colon cancer Neg Hx     Social History Social History   Tobacco Use   Smoking status: Never   Smokeless tobacco: Never   Vaping Use   Vaping status: Never Used  Substance Use Topics   Alcohol use: Not Currently    Comment: Occasional   Drug use: No     Allergies   Dairy aid [tilactase], Other, Shellfish allergy, Benadryl  [diphenhydramine  hcl], Egg-derived products, Iodine, and Tape   Review of Systems Review of Systems  Constitutional:  Positive for chills, diaphoresis and fatigue.  HENT:  Positive for rhinorrhea and sore throat. Negative for ear pain.   Respiratory:  Positive for cough. Negative for shortness of breath.   Cardiovascular:  Negative for chest pain.  Gastrointestinal:  Positive for diarrhea. Negative for nausea and vomiting.  Neurological:  Positive for headaches.     Physical Exam Triage Vital Signs ED Triage Vitals  Encounter Vitals Group     BP 04/30/23 1734 (!) 132/91     Systolic BP Percentile --      Diastolic BP Percentile --      Pulse Rate 04/30/23 1734 79     Resp 04/30/23 1734 18     Temp 04/30/23 1734 99.8 F (37.7 C)     Temp Source 04/30/23 1734 Oral     SpO2 04/30/23 1734 98 %     Weight 04/30/23 1754 272 lb (123.4 kg)     Height 04/30/23 1754 5' 4 (1.626 m)     Head Circumference --      Peak Flow --      Pain Score 04/30/23 1753 8     Pain Loc --      Pain Education --      Exclude from Growth Chart --    No data found.  Updated Vital Signs BP (!) 132/91 (BP Location: Right Arm)   Pulse 79   Temp 99.8 F (37.7 C) (Oral)   Resp 18   Ht 5' 4 (1.626 m)   Wt 272 lb (123.4 kg)   LMP 04/16/2023 (Exact Date)   SpO2 98%   BMI 46.69 kg/m   Visual Acuity Right Eye Distance:   Left Eye Distance:   Bilateral Distance:    Right Eye Near:   Left Eye Near:    Bilateral Near:     Physical Exam Vitals and nursing note reviewed.  Constitutional:      General: She is not in acute distress.    Appearance: She is not diaphoretic.  HENT:     Head: Normocephalic and atraumatic.     Right Ear: Tympanic membrane and ear canal normal.     Left Ear:  Tympanic membrane and ear canal normal.     Nose: Congestion present. No rhinorrhea.     Mouth/Throat:     Mouth: Mucous membranes are moist.     Pharynx: Oropharynx is clear. No oropharyngeal  exudate.  Eyes:     Conjunctiva/sclera: Conjunctivae normal.  Cardiovascular:     Rate and Rhythm: Normal rate and regular rhythm.     Heart sounds: Normal heart sounds.  Pulmonary:     Effort: Pulmonary effort is normal. No respiratory distress.     Breath sounds: Normal breath sounds. No wheezing or rales.  Musculoskeletal:     Cervical back: Neck supple.  Lymphadenopathy:     Cervical: No cervical adenopathy.  Neurological:     Mental Status: She is alert and oriented to person, place, and time.  Psychiatric:        Mood and Affect: Mood normal.      UC Treatments / Results  Labs (all labs ordered are listed, but only abnormal results are displayed) Labs Reviewed - No data to display  EKG   Radiology No results found.  Procedures Procedures (including critical care time)  Medications Ordered in UC Medications - No data to display  Initial Impression / Assessment and Plan / UC Course  I have reviewed the triage vital signs and the nursing notes.  Pertinent labs & imaging results that were available during my care of the patient were reviewed by me and considered in my medical decision making (see chart for details).     33 year old female flulike symptoms for 2 days no known contacts with illness.  Well-appearing, mildly hypertensive 132/91 low-grade fever 99.8 otherwise vital signs normal.  Point-of-care flu is negative. Likely viral illness, OTC meds for symptomatic relief, follow-up as needed Final Clinical Impressions(s) / UC Diagnoses   Final diagnoses:  None   Discharge Instructions   None    ED Prescriptions   None    PDMP not reviewed this encounter.   Bernadett Milian, [place] 04/30/23 [phone]

## 2023-05-02 ENCOUNTER — Other Ambulatory Visit (HOSPITAL_COMMUNITY): Payer: Self-pay

## 2023-05-02 ENCOUNTER — Telehealth: Payer: 59 | Admitting: Physician Assistant

## 2023-05-02 DIAGNOSIS — J208 Acute bronchitis due to other specified organisms: Secondary | ICD-10-CM

## 2023-05-02 DIAGNOSIS — B9689 Other specified bacterial agents as the cause of diseases classified elsewhere: Secondary | ICD-10-CM

## 2023-05-02 MED ORDER — AZITHROMYCIN 250 MG PO TABS
ORAL_TABLET | ORAL | 0 refills | Status: AC
  Filled 2023-05-02: qty 6, 5d supply, fill #0

## 2023-05-02 MED ORDER — ALBUTEROL SULFATE HFA 108 (90 BASE) MCG/ACT IN AERS
2.0000 | INHALATION_SPRAY | Freq: Four times a day (QID) | RESPIRATORY_TRACT | 0 refills | Status: DC | PRN
Start: 2023-05-02 — End: 2023-06-20
  Filled 2023-05-02: qty 6.7, 25d supply, fill #0

## 2023-05-02 MED ORDER — BENZONATATE 100 MG PO CAPS
100.0000 mg | ORAL_CAPSULE | Freq: Three times a day (TID) | ORAL | 0 refills | Status: DC | PRN
  Filled 2023-05-02: qty 30, 10d supply, fill #0

## 2023-05-02 NOTE — Patient Instructions (Signed)
 Fleeta Brought, thank you for joining Elsie Velma Lunger, PA-C for today's virtual visit.  While this provider is not your primary care provider (PCP), if your PCP is located in our provider database this encounter information will be shared with them immediately following your visit.   A [facility] MyChart account gives you access to today's visit and all your visits, tests, and labs performed at [facility]  click here if you don't have a [facility] MyChart account or go to [email or web address]  Consent: (Patient) Felicia Salinas provided verbal consent for this virtual visit at the beginning of the encounter.  Current Medications:  Current Outpatient Medications:    busPIRone  (BUSPAR ) 5 MG tablet, Take 1 tablet (5 mg total) by mouth 2 (two) times daily for anxiety., Disp: 180 tablet, Rfl: 0   norethindrone -ethinyl estradiol -FE (TARINA  FE 1/20 EQ) 1-20 MG-MCG tablet, Take 1 tablet by mouth daily., Disp: 84 tablet, Rfl: 2   propranolol  ER (INDERAL  LA) 120 MG 24 hr capsule, Take 1 capsule (120 mg total) by mouth at bedtime. For headache prevention, Disp: 90 capsule, Rfl: 1   sertraline  (ZOLOFT ) 100 MG tablet, Take 1 tablet (100 mg) by mouth daily for anxiety and depression., Disp: 90 tablet, Rfl: 2   SUMAtriptan  (IMITREX ) 50 MG tablet, Take 1 tablet by mouth at migraine onset. May repeat in 2 hours if headache persists or recurs., Disp: 10 tablet, Rfl: 0   Medications ordered in this encounter:  No orders of the defined types were placed in this encounter.    *If you need refills on other medications prior to your next appointment, please contact your pharmacy*  Follow-Up: Call back or seek an in-person evaluation if the symptoms worsen or if the condition fails to improve as anticipated.  [facility] [phone]  Other Instructions Take antibiotic (Azithromycin ) as directed.  Increase fluids.  Get plenty of rest. Use Mucinex for congestion. Use the  albuterol  inhaler and tessalon  as directed. Take a daily probiotic (I recommend Align or Culturelle, but even Activia Yogurt may be beneficial).  A humidifier placed in the bedroom may offer some relief for a dry, scratchy throat of nasal irritation.  Read information below on acute bronchitis. Please call or return to clinic if symptoms are not improving.  Acute Bronchitis Bronchitis is when the airways that extend from the windpipe into the lungs get red, puffy, and painful (inflamed). Bronchitis often causes thick spit (mucus) to develop. This leads to a cough. A cough is the most common symptom of bronchitis. In acute bronchitis, the condition usually begins suddenly and goes away over time (usually in 2 weeks). Smoking, allergies, and asthma can make bronchitis worse. Repeated episodes of bronchitis may cause more lung problems.  HOME CARE Rest. Drink enough fluids to keep your pee (urine) clear or pale yellow (unless you need to limit fluids as told by your doctor). Only take over-the-counter or prescription medicines as told by your doctor. Avoid smoking and secondhand smoke. These can make bronchitis worse. If you are a smoker, think about using nicotine gum or skin patches. Quitting smoking will help your lungs heal faster. Reduce the chance of getting bronchitis again by: Washing your hands often. Avoiding people with cold symptoms. Trying not to touch your hands to your mouth, nose, or eyes. Follow up with your doctor as told.  GET HELP IF: Your symptoms do not improve after 1 week of treatment. Symptoms include: Cough. Fever. Coughing up thick spit. Body aches. Chest  congestion. Chills. Shortness of breath. Sore throat.  GET HELP RIGHT AWAY IF:  You have an increased fever. You have chills. You have severe shortness of breath. You have bloody thick spit (sputum). You throw up (vomit) often. You lose too much body fluid (dehydration). You have a severe headache. You  faint.  MAKE SURE YOU:  Understand these instructions. Will watch your condition. Will get help right away if you are not doing well or get worse. Document Released: 08/29/2007 Document Revised: 11/12/2012 Document Reviewed: 09/02/2012 [facility] Patient Information 2015 [place], [place]. This information is not intended to replace advice given to you by your health care provider. Make sure you discuss any questions you have with your health care provider.    If you have been instructed to have an in-person evaluation today at a local Urgent Care facility, please use the link below. It will take you to a list of all of our available [facility], including address, phone number and hours of operation. Please do not delay care.  [facility]  If you or a family member do not have a primary care provider, use the link below to schedule a visit and establish care. When you choose a [facility] primary care physician or advanced practice provider, you gain a long-term partner in health. Find a Primary Care Provider  Learn more about [facility] in-office and virtual care options: [facility] - Get Care Now

## 2023-05-02 NOTE — Progress Notes (Signed)
 Virtual Visit Consent   Felicia Salinas, you are scheduled for a virtual visit with a [facility] provider today. Just as with appointments in the office, your consent must be obtained to participate. Your consent will be active for this visit and any virtual visit you may have with one of our providers in the next 365 days. If you have a MyChart account, a copy of this consent can be sent to you electronically.  As this is a virtual visit, video technology does not allow for your provider to perform a traditional examination. This may limit your provider's ability to fully assess your condition. If your provider identifies any concerns that need to be evaluated in person or the need to arrange testing (such as labs, EKG, etc.), we will make arrangements to do so. Although advances in technology are sophisticated, we cannot ensure that it will always work on either your end or our end. If the connection with a video visit is poor, the visit may have to be switched to a telephone visit. With either a video or telephone visit, we are not always able to ensure that we have a secure connection.  By engaging in this virtual visit, you consent to the provision of healthcare and authorize for your insurance to be billed (if applicable) for the services provided during this visit. Depending on your insurance coverage, you may receive a charge related to this service.  I need to obtain your verbal consent now. Are you willing to proceed with your visit today? Felicia Salinas has provided verbal consent on 05/02/2023 for a virtual visit (video or telephone). Felicia Salinas, [place]  Date: 05/02/2023 8:30 AM  Virtual Visit via Video Note   I, Felicia Salinas, connected with  Felicia Salinas  ([id number], 35-Feb-1992) on 05/02/23 at  8:30 AM EST by a video-enabled telemedicine application and verified that I am speaking with the correct person using two identifiers.  Location: Patient: Virtual Visit Location  Patient: Home Provider: Virtual Visit Location Provider: Home Office   I discussed the limitations of evaluation and management by telemedicine and the availability of in person appointments. The patient expressed understanding and agreed to proceed.    History of Present Illness: Felicia Salinas is a 33 y.o. who identifies as a female who was assigned female at birth, and is being seen today for week.  HPI: HPI  Problems:  Patient Active Problem List   Diagnosis Date Noted   Skin tag 11/28/2022   Iron  deficiency anemia due to chronic blood loss 11/02/2022   Preventative health care 02/14/2022   Prediabetes 09/01/2021   Hemorrhoids 04/18/2021   Class 3 severe obesity due to excess calories with body mass index (BMI) of 40.0 to 44.9 in adult [facility]) 12/27/2020   Anxiety and depression 12/27/2020   Other allergic rhinitis 07/04/2020   Lactose intolerance 07/04/2020   Adverse reaction to food, subsequent encounter 07/04/2020   Urticaria 07/04/2020   Hirsutism 05/06/2020   Infertility, female 05/06/2020   Empty sella turcica (HCC) 05/06/2020   Chronic migraine without aura, with intractable migraine, so stated, with status migrainosus 10/16/2019   OSA (obstructive sleep apnea) 10/16/2019   Insomnia 10/16/2019   Asthma 09/29/2018   Polycystic ovary syndrome 09/29/2018    Allergies:  Allergies  Allergen Reactions   Dairy Aid [Tilactase] Anaphylaxis   Other Hives and Anaphylaxis     shrimp, dairy products, shellfish ([place]) Other reaction(s): Hives   Shellfish Allergy Anaphylaxis   Benadryl  [Diphenhydramine  Hcl] Hives  Egg-Derived Products Hives and Rash   Iodine Hives and Rash   Tape Rash   Medications:  Current Outpatient Medications:    albuterol  (VENTOLIN  HFA) 108 (90 Base) MCG/ACT inhaler, Inhale 2 puffs into the lungs every 6 (six) hours as needed for wheezing or shortness of breath., Disp: 8 g, Rfl: 0   azithromycin  (ZITHROMAX ) 250 MG tablet, Take 2 tablets on day 1,  then 1 tablet daily on days 2 through 5, Disp: 6 tablet, Rfl: 0   benzonatate  (TESSALON ) 100 MG capsule, Take 1 capsule (100 mg total) by mouth 3 (three) times daily as needed for cough., Disp: 30 capsule, Rfl: 0   busPIRone  (BUSPAR ) 5 MG tablet, Take 1 tablet (5 mg total) by mouth 2 (two) times daily for anxiety., Disp: 180 tablet, Rfl: 0   propranolol  ER (INDERAL  LA) 120 MG 24 hr capsule, Take 1 capsule (120 mg total) by mouth at bedtime. For headache prevention, Disp: 90 capsule, Rfl: 1   sertraline  (ZOLOFT ) 100 MG tablet, Take 1 tablet (100 mg) by mouth daily for anxiety and depression., Disp: 90 tablet, Rfl: 2   SUMAtriptan  (IMITREX ) 50 MG tablet, Take 1 tablet by mouth at migraine onset. May repeat in 2 hours if headache persists or recurs., Disp: 10 tablet, Rfl: 0  Observations/Objective: Patient is well-developed, well-nourished in no acute distress.  Resting comfortably at home.  Head is normocephalic, atraumatic.  No labored breathing. Speech is clear and coherent with logical content.  Patient is alert and oriented at baseline.   Assessment and Plan: 1. Acute bacterial bronchitis (Primary) - albuterol  (VENTOLIN  HFA) 108 (90 Base) MCG/ACT inhaler; Inhale 2 puffs into the lungs every 6 (six) hours as needed for wheezing or shortness of breath.  Dispense: 8 g; Refill: 0 - benzonatate  (TESSALON ) 100 MG capsule; Take 1 capsule (100 mg total) by mouth 3 (three) times daily as needed for cough.  Dispense: 30 capsule; Refill: 0 - azithromycin  (ZITHROMAX ) 250 MG tablet; Take 2 tablets on day 1, then 1 tablet daily on days 2 through 5  Dispense: 6 tablet; Refill: 0  Supportive measures and OTC medications reviewed. Will add on albuterol  and tessalon  for cough and wheeze. Azithromycin  per orders. Follow-up in person if not resolving or for any new/worsening symptoms despite treatment.  Follow Up Instructions: I discussed the assessment and treatment plan with the patient. The patient was  provided an opportunity to ask questions and all were answered. The patient agreed with the plan and demonstrated an understanding of the instructions.  A copy of instructions were sent to the patient via MyChart unless otherwise noted below.   The patient was advised to call back or seek an in-person evaluation if the symptoms worsen or if the condition fails to improve as anticipated.    Felicia Velma Lunger, PA-C

## 2023-06-20 ENCOUNTER — Encounter: Payer: Self-pay | Admitting: Primary Care

## 2023-06-20 ENCOUNTER — Telehealth (INDEPENDENT_AMBULATORY_CARE_PROVIDER_SITE_OTHER): Payer: Self-pay | Admitting: Primary Care

## 2023-06-20 DIAGNOSIS — J4520 Mild intermittent asthma, uncomplicated: Secondary | ICD-10-CM

## 2023-06-20 DIAGNOSIS — K649 Unspecified hemorrhoids: Secondary | ICD-10-CM

## 2023-06-20 DIAGNOSIS — B9689 Other specified bacterial agents as the cause of diseases classified elsewhere: Secondary | ICD-10-CM

## 2023-06-20 DIAGNOSIS — G43711 Chronic migraine without aura, intractable, with status migrainosus: Secondary | ICD-10-CM

## 2023-06-20 MED ORDER — TOPIRAMATE 50 MG PO TABS: 50.0000 mg | ORAL_TABLET | Freq: Every day | ORAL | 0 refills | Status: AC

## 2023-06-20 MED ORDER — ALBUTEROL SULFATE HFA 108 (90 BASE) MCG/ACT IN AERS: 2.0000 | INHALATION_SPRAY | Freq: Four times a day (QID) | RESPIRATORY_TRACT | 0 refills | Status: AC | PRN

## 2023-06-20 MED ORDER — HYDROCORTISONE ACETATE 25 MG RE SUPP: 25.0000 mg | Freq: Two times a day (BID) | RECTAL | 0 refills | Status: AC

## 2023-06-20 MED ORDER — SUMATRIPTAN SUCCINATE 100 MG PO TABS: ORAL_TABLET | ORAL | 0 refills | Status: AC

## 2023-06-20 MED ORDER — CETIRIZINE HCL 10 MG PO TABS: 10.0000 mg | ORAL_TABLET | Freq: Every day | ORAL | 3 refills | Status: AC

## 2023-06-20 NOTE — Assessment & Plan Note (Signed)
 Overall stable, but may deteriorate with pollen season   Refill albuterol today for PRN use. Start Zyrtec 10 mg daily.

## 2023-06-20 NOTE — Assessment & Plan Note (Signed)
 Uncontrolled.  Stop propranolol ER 120 mg. Start Topamax 50 mg HS, may need to titrate upward as she was once on 100 mg.  Increase Imitrex to 100 mg PRN.  She will update

## 2023-06-20 NOTE — Progress Notes (Signed)
 Patient ID: Felicia Salinas, female    DOB: 11-14-1990, 33 y.o.   MRN: [id number]  Virtual visit completed through [employer], a video enabled telemedicine application. Due to national recommendations of social distancing due to COVID-19, a virtual visit is felt to be most appropriate for this patient at this time. Reviewed limitations, risks, security and privacy concerns of performing a virtual visit and the availability of in person appointments. I also reviewed that there may be a patient responsible charge related to this service. The patient agreed to proceed.   Patient location: home Provider location: [place] at [facility], office Persons participating in this virtual visit: patient, provider   If any vitals were documented, they were collected by patient at home unless specified below.    There were no vitals taken for this visit.   CC: Medication Refill  Subjective:   HPI: Felicia Salinas is a 33 y.o. female with a history of migraines, hemorrhoids, asthma, OSA, insomnia, class III severe obesity, anxiety depression, prediabetes presenting on 06/20/2023 for Headache (Following up on headaches to get medication refilled.//Requesting refill fro albuterol) and Hemorrhoids (Requesting refill on suppository for hemorrhoids)  1) Migraines/Headaches: Currently managed on propranolol ER 120 mg at bedtime for migraine/headache prevention and sumatriptan 50 mg as needed for migraine abortion.  Despite nightly compliance to her propranolol, she continues to experience headaches 3 days weekly. Her headache intensity has reduced since on propranolol. She's tried Tea Tree Oil and muscle rub without improvement.   Imitrex 50 mg helps with migraines, but she always has to take a second dose. Sometimes the second dose not help.  Previously managed on Topamax years ago, thinks this helped with headaches. She isn't sure why she discontinued.   2) Hemorrhoids: Chronic, intermittent, infrequent. Also  with constipation. She is requesting a refill of her suppositories. She will drink laxative tea. She is working to improve her diet. She is drinking plenty of water daily.   3) Asthma: Currently managed on albuterol inhaler as needed. More recently she's noticed cough, shortness of breath when going outdoors. She recently moved to [place]. She is needing a refill of her albuterol inhaler.        Relevant past medical, surgical, family and social history reviewed and updated as indicated. Interim medical history since our last visit reviewed. Allergies and medications reviewed and updated. Outpatient Medications Prior to Visit  Medication Sig Dispense Refill   sertraline (ZOLOFT) 100 MG tablet Take 1 tablet (100 mg) by mouth daily for anxiety and depression. 90 tablet 2   albuterol (VENTOLIN HFA) 108 (90 Base) MCG/ACT inhaler Inhale 2 puffs into the lungs every 6 (six) hours as needed for wheezing or shortness of breath. 8.5 g 0   propranolol ER (INDERAL LA) 120 MG 24 hr capsule Take 1 capsule (120 mg total) by mouth at bedtime. For headache prevention 90 capsule 1   SUMAtriptan (IMITREX) 50 MG tablet Take 1 tablet by mouth at migraine onset. May repeat in 2 hours if headache persists or recurs. 10 tablet 0   busPIRone (BUSPAR) 5 MG tablet Take 1 tablet (5 mg total) by mouth 2 (two) times daily for anxiety. (Patient not taking: Reported on 06/20/2023) 180 tablet 0   benzonatate (TESSALON) 100 MG capsule Take 1 capsule (100 mg total) by mouth 3 (three) times daily as needed for cough. (Patient not taking: Reported on 06/20/2023) 30 capsule 0   No facility-administered medications prior to visit.     Per HPI unless specifically  indicated in ROS section below Review of Systems  Respiratory:  Positive for cough and shortness of breath. Negative for chest tightness and wheezing.   Cardiovascular:  Negative for chest pain.  Gastrointestinal:  Positive for constipation.  Neurological:  Positive for  headaches.   Objective:  There were no vitals taken for this visit.  Wt Readings from Last 3 Encounters:  04/30/23 272 lb (123.4 kg)  03/22/23 271 lb (122.9 kg)  01/17/23 271 lb 4.8 oz (123.1 kg)       Physical exam: General: Alert and oriented x 3, no distress, does not appear sickly  Pulmonary: Speaks in complete sentences without increased work of breathing, no cough during visit.  Psychiatric: Normal mood, thought content, and behavior.     Results for orders placed or performed during the hospital encounter of 04/30/23  POCT Influenza A/B   Collection Time: 04/30/23  6:26 PM  Result Value Ref Range   Influenza A, POC Negative Negative   Influenza B, POC Negative Negative   Assessment & Plan:   Problem List Items Addressed This Visit       Cardiovascular and Mediastinum   Chronic migraine without aura, with intractable migraine, so stated, with status migrainosus   Uncontrolled.  Stop propranolol ER 120 mg. Start Topamax 50 mg HS, may need to titrate upward as she was once on 100 mg.  Increase Imitrex to 100 mg PRN.  She will update      Relevant Medications   topiramate (TOPAMAX) 50 MG tablet   SUMAtriptan (IMITREX) 100 MG tablet   Hemorrhoids   Discussed methods for hemorrhoid prevention and constipation. Refill Anusol HC suppositories for BID use PRN.      Relevant Medications   hydrocortisone (ANUSOL-HC) 25 MG suppository     Respiratory   Asthma - Primary   Overall stable, but may deteriorate with pollen season   Refill albuterol today for PRN use. Start Zyrtec 10 mg daily.       Relevant Medications   cetirizine (ZYRTEC) 10 MG tablet   albuterol (VENTOLIN HFA) 108 (90 Base) MCG/ACT inhaler   Other Visit Diagnoses       Acute bacterial bronchitis            Meds ordered this encounter  Medications   topiramate (TOPAMAX) 50 MG tablet    Sig: Take 1 tablet (50 mg total) by mouth at bedtime. For headache prevention    Dispense:  90  tablet    Refill:  0    Supervising Provider:   BEDSOLE, AMY E [[id number]]   SUMAtriptan (IMITREX) 100 MG tablet    Sig: Take 1 tablet by mouth at migraine onset. May repeat in 2 hours if headache persists or recurs.    Dispense:  10 tablet    Refill:  0    Supervising Provider:   Ermalene Searing, AMY E [[id number]]   hydrocortisone (ANUSOL-HC) 25 MG suppository    Sig: Place 1 suppository (25 mg total) rectally 2 (two) times daily.    Dispense:  12 suppository    Refill:  0    Supervising Provider:   BEDSOLE, AMY E [[id number]]   cetirizine (ZYRTEC) 10 MG tablet    Sig: Take 1 tablet (10 mg total) by mouth daily. For allergies    Dispense:  90 tablet    Refill:  3    Supervising Provider:   BEDSOLE, AMY E [[id number]]   albuterol (VENTOLIN HFA) 108 (90 Base) MCG/ACT inhaler    Sig: Inhale  2 puffs into the lungs every 6 (six) hours as needed for wheezing or shortness of breath.    Dispense:  8.5 g    Refill:  0    Supervising Provider:   BEDSOLE, AMY E [[id number]]   No orders of the defined types were placed in this encounter.   I discussed the assessment and treatment plan with the patient. The patient was provided an opportunity to ask questions and all were answered. The patient agreed with the plan and demonstrated an understanding of the instructions. The patient was advised to call back or seek an in-person evaluation if the symptoms worsen or if the condition fails to improve as anticipated.  Follow up plan:  Start Topamax 50 mg at bedtime for headache prevention.   Stop propranolol for headache prevention.Stop propranolol ER 120 mg for headache prevention.   We increased your dose of Imitrex to 100 mg for migraine abortion.   Shortness of Breath/Wheezing/Cough: Use the albuterol inhaler. Inhale 2 puffs into the lungs every 4 to 6 hours as needed for wheezing, cough, and/or shortness of breath.   Please keep me updated!  Doreene Nest, NP

## 2023-06-20 NOTE — Patient Instructions (Signed)
 Start Topamax 50 mg at bedtime for headache prevention.   Stop propranolol ER 120 mg for headache prevention.   We increased your dose of Imitrex to 100 mg for migraine abortion.   Shortness of Breath/Wheezing/Cough: Use the albuterol inhaler. Inhale 2 puffs into the lungs every 4 to 6 hours as needed for wheezing, cough, and/or shortness of breath.   Please keep me updated!

## 2023-06-20 NOTE — Assessment & Plan Note (Signed)
 Discussed methods for hemorrhoid prevention and constipation. Refill Anusol HC suppositories for BID use PRN.

## 2023-07-11 ENCOUNTER — Other Ambulatory Visit: Payer: 59

## 2023-07-18 ENCOUNTER — Ambulatory Visit: Payer: 59 | Admitting: Hematology and Oncology
